# Patient Record
Sex: Male | Born: 1935 | Race: Black or African American | Hispanic: No | Marital: Single | Smoking: Former smoker
Health system: Southern US, Community
[De-identification: ages and names within clinical notes are randomized; demographics above are authoritative.]

## PROBLEM LIST (undated history)

## (undated) DIAGNOSIS — G309 Alzheimer's disease, unspecified: Secondary | ICD-10-CM

## (undated) DIAGNOSIS — N429 Disorder of prostate, unspecified: Secondary | ICD-10-CM

## (undated) DIAGNOSIS — F0280 Dementia in other diseases classified elsewhere without behavioral disturbance: Secondary | ICD-10-CM

## (undated) DIAGNOSIS — I10 Essential (primary) hypertension: Secondary | ICD-10-CM

## (undated) HISTORY — PX: TOTAL HIP ARTHROPLASTY: SHX124

---

## 2000-04-05 ENCOUNTER — Ambulatory Visit (HOSPITAL_COMMUNITY): Admission: RE | Admit: 2000-04-05 | Discharge: 2000-04-05 | Payer: Self-pay | Admitting: Gastroenterology

## 2000-04-05 ENCOUNTER — Encounter (INDEPENDENT_AMBULATORY_CARE_PROVIDER_SITE_OTHER): Payer: Self-pay | Admitting: Specialist

## 2001-05-03 ENCOUNTER — Emergency Department (HOSPITAL_COMMUNITY): Admission: EM | Admit: 2001-05-03 | Discharge: 2001-05-03 | Payer: Self-pay | Admitting: Emergency Medicine

## 2001-05-03 ENCOUNTER — Encounter: Payer: Self-pay | Admitting: Emergency Medicine

## 2004-07-13 ENCOUNTER — Inpatient Hospital Stay (HOSPITAL_COMMUNITY): Admission: RE | Admit: 2004-07-13 | Discharge: 2004-07-19 | Payer: Self-pay | Admitting: Orthopaedic Surgery

## 2006-11-03 ENCOUNTER — Ambulatory Visit: Admission: RE | Admit: 2006-11-03 | Discharge: 2007-02-01 | Payer: Self-pay | Admitting: Radiation Oncology

## 2007-02-02 ENCOUNTER — Encounter: Admission: RE | Admit: 2007-02-02 | Discharge: 2007-02-02 | Payer: Self-pay | Admitting: Urology

## 2007-02-09 ENCOUNTER — Ambulatory Visit (HOSPITAL_BASED_OUTPATIENT_CLINIC_OR_DEPARTMENT_OTHER): Admission: RE | Admit: 2007-02-09 | Discharge: 2007-02-09 | Payer: Self-pay | Admitting: Urology

## 2007-03-02 ENCOUNTER — Ambulatory Visit: Admission: RE | Admit: 2007-03-02 | Discharge: 2007-04-24 | Payer: Self-pay | Admitting: Radiation Oncology

## 2010-04-01 ENCOUNTER — Inpatient Hospital Stay (HOSPITAL_COMMUNITY): Admission: EM | Admit: 2010-04-01 | Discharge: 2009-05-22 | Payer: Self-pay | Admitting: Emergency Medicine

## 2010-07-11 LAB — DIFFERENTIAL
Eosinophils Relative: 0 % (ref 0–5)
Lymphocytes Relative: 6 % — ABNORMAL LOW (ref 12–46)
Lymphs Abs: 0.4 10*3/uL — ABNORMAL LOW (ref 0.7–4.0)
Monocytes Relative: 5 % (ref 3–12)
Neutrophils Relative %: 88 % — ABNORMAL HIGH (ref 43–77)

## 2010-07-11 LAB — CBC
HCT: 37.3 % — ABNORMAL LOW (ref 39.0–52.0)
Hemoglobin: 12.8 g/dL — ABNORMAL LOW (ref 13.0–17.0)
MCHC: 32.8 g/dL (ref 30.0–36.0)
MCV: 93.5 fL (ref 78.0–100.0)
Platelets: 120 10*3/uL — ABNORMAL LOW (ref 150–400)
RBC: 4.05 MIL/uL — ABNORMAL LOW (ref 4.22–5.81)
RDW: 14.5 % (ref 11.5–15.5)
RDW: 14.8 % (ref 11.5–15.5)

## 2010-07-11 LAB — CULTURE, BLOOD (ROUTINE X 2)
Culture: NO GROWTH
Culture: NO GROWTH

## 2010-07-11 LAB — COMPREHENSIVE METABOLIC PANEL
ALT: 49 U/L (ref 0–53)
ALT: 57 U/L — ABNORMAL HIGH (ref 0–53)
AST: 78 U/L — ABNORMAL HIGH (ref 0–37)
Albumin: 2.4 g/dL — ABNORMAL LOW (ref 3.5–5.2)
Alkaline Phosphatase: 78 U/L (ref 39–117)
Alkaline Phosphatase: 82 U/L (ref 39–117)
BUN: 26 mg/dL — ABNORMAL HIGH (ref 6–23)
CO2: 26 mEq/L (ref 19–32)
Calcium: 7.5 mg/dL — ABNORMAL LOW (ref 8.4–10.5)
Glucose, Bld: 95 mg/dL (ref 70–99)
Potassium: 3.9 mEq/L (ref 3.5–5.1)
Sodium: 133 mEq/L — ABNORMAL LOW (ref 135–145)
Sodium: 135 mEq/L (ref 135–145)
Total Bilirubin: 0.7 mg/dL (ref 0.3–1.2)

## 2010-07-11 LAB — URINE MICROSCOPIC-ADD ON

## 2010-07-11 LAB — URINALYSIS, ROUTINE W REFLEX MICROSCOPIC
Glucose, UA: NEGATIVE mg/dL
Glucose, UA: NEGATIVE mg/dL
Ketones, ur: 15 mg/dL — AB
Ketones, ur: 15 mg/dL — AB
Leukocytes, UA: NEGATIVE
Nitrite: NEGATIVE
Protein, ur: >300 mg/dL — AB
Specific Gravity, Urine: 1.023 (ref 1.005–1.030)
pH: 6 (ref 5.0–8.0)

## 2010-07-11 LAB — POCT I-STAT, CHEM 8
BUN: 27 mg/dL — ABNORMAL HIGH (ref 6–23)
Creatinine, Ser: 2.2 mg/dL — ABNORMAL HIGH (ref 0.4–1.5)
Glucose, Bld: 111 mg/dL — ABNORMAL HIGH (ref 70–99)
HCT: 40 % (ref 39.0–52.0)
Potassium: 3.6 mEq/L (ref 3.5–5.1)
Sodium: 131 mEq/L — ABNORMAL LOW (ref 135–145)

## 2010-07-11 LAB — LEGIONELLA ANTIGEN, URINE

## 2010-07-11 LAB — URINE CULTURE: Colony Count: NO GROWTH

## 2010-07-21 ENCOUNTER — Ambulatory Visit
Admission: RE | Admit: 2010-07-21 | Discharge: 2010-07-21 | Disposition: A | Discharge status: Home / Self Care | Payer: Medicare Other | Source: Ambulatory Visit | Attending: Gastroenterology | Admitting: Gastroenterology

## 2010-07-21 ENCOUNTER — Other Ambulatory Visit: Payer: Self-pay | Admitting: Gastroenterology

## 2010-07-21 DIAGNOSIS — Q438 Other specified congenital malformations of intestine: Secondary | ICD-10-CM

## 2010-09-07 NOTE — Op Note (Signed)
NAME:  Manuel [name], Manuel [name]              ACCOUNT NO.:  [id number]   MEDICAL RECORD NO.:  [id number]          PATIENT TYPE:  AMB   LOCATION:  N[facility]C                         FACILITY:  [facility]   PHYSICIAN:  Lindaann Slough, M.D.  DATE OF BIRTH:  04-Jul-1932   DATE OF PROCEDURE:  02/09/2007  DATE OF DISCHARGE:                               OPERATIVE REPORT   PREOPERATIVE DIAGNOSIS:  Adenocarcinoma of prostate.   POSTOPERATIVE DIAGNOSIS:  Adenocarcinoma of prostate.   PROCEDURE DONE:  I-125 seed implantation and cystoscopy.   SURGEON:  Danae Chen, M.D. and Artist Pais. Kathrynn Running, M.D.   ANESTHESIA:  General.   INDICATIONS:  The patient is a 77 year old male who was found by biopsy  to have adenocarcinoma of prostate,  Gleason score 3 + 4.  PSA at  diagnosis was 4.15.  Treatment options were discussed with the patient  and he chose to have radiation.  He was seen by Dr. Kathrynn Running and was  advised to have combination therapy.  He already had IMRT and he is now  scheduled for seeds implantation.   Under general anesthesia the patient was prepped and draped and placed  in the dorsal lithotomy position.  The transducer was inserted in the  rectum after insertion of a Foley catheter and a rectal tube.  Ultrasound planning was done by Dr. Kathrynn Running.  After ultrasound planning,  the Nucletron was used.  A total of 53 seeds were implanted in the  prostate through 20 needles.  The total activity is 17.4370 mCi.  There  appears to be good seeds distribution.  Then the Foley catheter was  removed.  A flexible cystoscope was inserted in the bladder.  The  anterior urethra is normal.  There is moderate prostatic hypertrophy.  The bladder is moderately trabeculated.  There is no stone or tumor or  seed in the bladder.  The ureteral orifices are in normal position and  shape with clear efflux.  The cystoscope was then removed.  A #16 Foley  catheter was then inserted in the bladder.   The patient tolerated the  procedure well and left the OR in satisfactory  condition to postanesthesia care unit.      Lindaann Slough, M.D.  Electronically Signed     MN/MEDQ  D:  02/09/2007  T:  02/10/2007  Job:  [id number]   cc:   Artist Pais Kathrynn Running, M.D.  Fax: [phone]   Merlene Laughter. Renae Gloss, M.D.  Fax: [phone]

## 2010-09-10 NOTE — Op Note (Signed)
NAME:  Manuel [name], Manuel [name]              ACCOUNT NO.:  [id number]   MEDICAL RECORD NO.:  [id number]          PATIENT TYPE:  INP   LOCATION:  2550                         FACILITY:  [facility]   PHYSICIAN:  Lubertha Basque. Dalldorf, M.D.DATE OF BIRTH:  Dec 14, 1932   DATE OF PROCEDURE:  07/13/2004  DATE OF DISCHARGE:                                 OPERATIVE REPORT   REFERRING PHYSICIAN:  Cala Bradford R. Renae Gloss, M.D.   PREOPERATIVE DIAGNOSIS:  Right hip degenerative arthritis.   POSTOPERATIVE DIAGNOSIS:  Right hip degenerative arthritis.   PROCEDURE:  Right total hip replacement.   ANESTHESIA:  Spinal.   ATTENDING SURGEON:  Lubertha Basque. Jerl Santos, M.D.   ASSISTANT:  Lindwood Qua, P.A.   INDICATIONS FOR PROCEDURE:  The patient is a 77 year old man  with long  history of right hip pain and stiffness. By x-ray, he has an end-stage  degenerative change. He has pain which wakes him from sleep and pain which  limits his ability to walk and remain active. He has failed various oral  anti-inflammatories and the use of a cane. He is offered a hip replacement  procedure. Informed operative consent was obtained after discussion of  possible complications of reaction to anesthesia, infection, DVT, PE,  dislocation, and death.   DESCRIPTION OF PROCEDURE:  The patient was taken to the operating suite  where spinal was applied without difficulty. He was positioned in lateral  decubitus position with the right hip up. All bony prominences were  appropriately padded, axillary roll was placed, and hip positioners were  utilized. He was then prepped and draped in normal sterile fashion. After  administration of preoperative IV Kefzol, a posterior approach was taken to  the right hip. All appropriate anti-infective measures were used including  closed hooded exhaust systems for each member the surgical team,  preoperative IV antibiotic, and Betadine impregnated drape. Dissection was  carried through a small amount of  adipose tissue to expose the IT band and  gluteus maximus fascia. These were incised longitudinally. The short  external rotators of the hip were tagged and reflected and a posterior  capsulectomy was performed. The hip was dislocated and a femoral neck cut  made at the appropriate level with an oscillating saw. The acetabulum was  exposed with labral tissues excised. A reaming was taken in a medial  direction to the interior wall of the pelvis and then sequentially up sized  to a 53 followed by placement of a size 54 pinnacle porous coated acetabular  component. Attention was turned toward the femur. This was exposed  appropriately and canal finder was utilized. This was reamed and then  sequentially broached up to a size 5 which seemed to fit well. A trial  reduction was done with several components and seemed best in terms of leg  length and stability with a +5 standard head neck assembly. The trial  components were removed followed by placement of a size 5 standard Summit  porous coated stem by [employer]. This was capped with the 36 +5 cobalt chrome  head assembly. We removed the trial liner from the cuff and placed  a hole  eliminator and a metal on metal liner size 36 x 54. The hip was again  reduced and was stable in extension with external rotation and flexion with  internal rotation. Leg lengths were felt to be about equal. The wound was  thoroughly irrigated followed by reapproximation of the short external  rotators to the greater trochanteric region with nonabsorbable suture. IT  band and gluteus maximus fascia reapproximated with #1 Vicryl interrupted  fashion. Subcutaneous tissues reapproximated with 0 and 2-0 undyed Vicryl  followed by skin closure with staples. Adaptic was placed on the wound  followed by dry gauze and tape. Estimated blood loss and intraoperative  fluids can be obtained from anesthesia records.   DISPOSITION:  The patient was taken to recovery in stable  addition. Plans  were for him to be admitted orthopedic surgery service for appropriate  postop care to include perioperative antibiotics and Coumadin plus Lovenox  for DVT prophylaxis.      PGD/MEDQ  D:  07/13/2004  T:  07/13/2004  Job:  [id number]   cc:   Merlene Laughter. Renae Gloss, M.D.  [address]  [address]  [place]  [place] [address]  Fax: [phone]

## 2010-09-10 NOTE — Discharge Summary (Signed)
NAME:  Manuel [name], Manuel [name] NO.:  [id number]   MEDICAL RECORD NO.:  [id number]          PATIENT TYPE:  INP   LOCATION:  5031                         FACILITY:  [facility]   PHYSICIAN:  Lubertha Basque. Dalldorf, M.D.DATE OF BIRTH:  1933-01-04   DATE OF ADMISSION:  07/13/2004  DATE OF DISCHARGE:  07/19/2004                                 DISCHARGE SUMMARY   ADMITTING DIAGNOSES:  1.  Right hip end-stage degenerative joint disease.  2.  History of hypertension.   DISCHARGE DIAGNOSES:  1.  Right hip end-stage degenerative joint disease.  2.  History of hypertension.  3.  Blood loss anemia.   BRIEF HISTORY:  Manuel [name] is a 77 year old black male patient well known  to our practice.  He has had right hip pain and stiffness for a number of  years.  We have taken x-rays and he has end-stage DJD in his right hip.  Now  he is having trouble sleeping and walking and being comfortable.  He has  failed oral anti-inflammatory medications and is currently using a cane.  X-  rays show end-stage DJD of his hip.  We have discussed with him the  different treatment options of that being a total hip replacement with the  anesthesia risk, infection risk, DVT, PE, dislocation and possible death.   PERTINENT LABORATORY AND X-RAY FINDINGS:  Last pro time was 2.1, hemoglobin  9.9, hematocrit 28.8, WBC 8.6, platelets 121,000.  He was O positive,  antibody screen negative blood type.  Two units of packed RBCs were given as  necessary postoperatively.  Chest x-ray shows no active cardiopulmonary  disease.   POSTOPERATIVELY AND COURSE IN THE HOSPITAL:  He was on an IV of lactated  Ringers, Ancef 1 g q.8h. x3 doses IV, Colace, Trinsicon, Coumadin and  Lovenox protocol for DVT prophylaxis with Percocet and Phenergan.  Also a  PCA morphine pump was used.  He was kept on his home medicines, Benicar 1  p.o. b.i.d.  He was instructed to new knee-high TEDs, incentive spirometry  q.1h., knee immobilizer  right knee at nighttime while in bed.  He had  physical therapy and occupational therapy consults and follow up lab studies  postoperatively as described above.  He could be touchdown weightbearing  postoperatively the first day postop.  His vital signs were stable.  Foley  catheter was in place.  PCA pump was helping his pain control.  He was  afebrile.  Wound was benign.  No sign of irritation or infection, hematocrit  28.  The second day postop, his blood pressure was 140/84, heart rate of  119, temperature was 99, hemoglobin dropped to 7.1.  INR 1.6.  His lungs  were clear.  His abdomen was soft.  Dressing was changed.  Wound was benign.  No sign of irritation of infection.  He had a normal neurovascular status  and two units of packed RBCs were transfused at this time.  The third day  postop, his hemoglobin was repeated and was above 9 post infusion.  Lungs  continued clear.  Abdomen soft throughout his hospital stay and  we had begun  physical therapy.  He was touchdown weightbearing and planned discharge for  Monday due to the fact that he had family members that would be available to  help upon discharge at that time.  The last day of his admission to the  hospital, his blood pressure was 136/79, INR 2.1, heart rate of 106,  temperature was 98.  Lungs were clear to A&P.  Abdomen was soft.  Cardiac:  S1 and S2.  His right hip wound was normal.  No sign of infection or  irritation with good neurovascular status to his toes and he was discharged  home.   CONDITION ON DISCHARGE:  Improved.   DISCHARGE MEDICATIONS:  1.  Pharmacy will dose the Coumadin per DVT prophylaxis protocol.  2.  Percocet one or two every four to six hours as pain for needed.  3.  Benicar one pill b.i.d.  4.  Robaxin 750 one pill every eight hours as needed for spasm.  5.  Ambien 10 mg one at nighttime for sleep p.r.n.   [facility] for physical therapy and pro times to be done.  He is to  be touchdown  weightbearing.  Diet is unrestricted and may do daily dressing  changes.  Any sign of infection, redness, drainage, irritation, increasing  pain, call [phone].  Also, that same number for an appointment in one week.      MC/MEDQ  D:  07/19/2004  T:  07/19/2004  Job:  [id number]

## 2011-02-03 LAB — PROTIME-INR: Prothrombin Time: 13

## 2011-02-03 LAB — COMPREHENSIVE METABOLIC PANEL
ALT: 25
Albumin: 3.6
CO2: 29
Calcium: 9.2
GFR calc non Af Amer: 54 — ABNORMAL LOW
Sodium: 137
Total Protein: 7

## 2011-02-03 LAB — CBC
HCT: 38.8 — ABNORMAL LOW
MCHC: 34
MCV: 89.4
RBC: 4.34

## 2013-09-01 ENCOUNTER — Encounter (HOSPITAL_COMMUNITY): Payer: Self-pay | Admitting: Emergency Medicine

## 2013-09-01 ENCOUNTER — Emergency Department (HOSPITAL_COMMUNITY): Payer: Medicare HMO

## 2013-09-01 ENCOUNTER — Emergency Department (HOSPITAL_COMMUNITY)
Admission: EM | Admit: 2013-09-01 | Discharge: 2013-09-01 | Disposition: A | Discharge status: Home / Self Care | Payer: Medicare HMO | Attending: Emergency Medicine | Admitting: Emergency Medicine

## 2013-09-01 DIAGNOSIS — Z880 Allergy status to penicillin: Secondary | ICD-10-CM | POA: Insufficient documentation

## 2013-09-01 DIAGNOSIS — Z79899 Other long term (current) drug therapy: Secondary | ICD-10-CM | POA: Insufficient documentation

## 2013-09-01 DIAGNOSIS — M545 Low back pain, unspecified: Secondary | ICD-10-CM | POA: Insufficient documentation

## 2013-09-01 DIAGNOSIS — Z87891 Personal history of nicotine dependence: Secondary | ICD-10-CM | POA: Insufficient documentation

## 2013-09-01 DIAGNOSIS — I10 Essential (primary) hypertension: Secondary | ICD-10-CM | POA: Insufficient documentation

## 2013-09-01 DIAGNOSIS — G309 Alzheimer's disease, unspecified: Secondary | ICD-10-CM | POA: Insufficient documentation

## 2013-09-01 DIAGNOSIS — M549 Dorsalgia, unspecified: Secondary | ICD-10-CM

## 2013-09-01 DIAGNOSIS — F0280 Dementia in other diseases classified elsewhere without behavioral disturbance: Secondary | ICD-10-CM | POA: Insufficient documentation

## 2013-09-01 HISTORY — DX: Dementia in other diseases classified elsewhere, unspecified severity, without behavioral disturbance, psychotic disturbance, mood disturbance, and anxiety: F02.80

## 2013-09-01 HISTORY — DX: Essential (primary) hypertension: I10

## 2013-09-01 HISTORY — DX: Alzheimer's disease, unspecified: G30.9

## 2013-09-01 MED ORDER — DIAZEPAM 2 MG PO TABS
2.0000 mg | ORAL_TABLET | Freq: Once | ORAL | Status: AC
  Administered 2013-09-01: 2 mg via ORAL
  Filled 2013-09-01: qty 1

## 2013-09-01 MED ORDER — DIAZEPAM 2 MG PO TABS: 2.0000 mg | ORAL_TABLET | Freq: Four times a day (QID) | ORAL | Status: DC | PRN

## 2013-09-01 NOTE — ED Notes (Addendum)
Pt states back pain starting this morning.  Pt states no trauma.  Pt family states he has been leaning forward more lately.  Walks with walker.  No falls.  No difficulty urinating or pain with urination.  Pain is in lower back and below the belt.

## 2013-09-01 NOTE — Discharge Instructions (Signed)
Back Pain, Adult Low back pain is very common. About 1 in 5 people have back pain.The cause of low back pain is rarely dangerous. The pain often gets better over time.About half of people with a sudden onset of back pain feel better in just 2 weeks. About 8 in 10 people feel better by 6 weeks.  CAUSES Some common causes of back pain include:  Strain of the muscles or ligaments supporting the spine.  Wear and tear (degeneration) of the spinal discs.  Arthritis.  Direct injury to the back. DIAGNOSIS Most of the time, the direct cause of low back pain is not known.However, back pain can be treated effectively even when the exact cause of the pain is unknown.Answering your caregiver's questions about your overall health and symptoms is one of the most accurate ways to make sure the cause of your pain is not dangerous. If your caregiver needs more information, he or she may order lab work or imaging tests (X-rays or MRIs).However, even if imaging tests show changes in your back, this usually does not require surgery. HOME CARE INSTRUCTIONS For many people, back pain returns.Since low back pain is rarely dangerous, it is often a condition that people can learn to manageon their own.   Remain active. It is stressful on the back to sit or stand in one place. Do not sit, drive, or stand in one place for more than 30 minutes at a time. Take short walks on level surfaces as soon as pain allows.Try to increase the length of time you walk each day.  Do not stay in bed.Resting more than 1 or 2 days can delay your recovery.  Do not avoid exercise or work.Your body is made to move.It is not dangerous to be active, even though your back may hurt.Your back will likely heal faster if you return to being active before your pain is gone.  Pay attention to your body when you bend and lift. Many people have less discomfortwhen lifting if they bend their knees, keep the load close to their bodies,and  avoid twisting. Often, the most comfortable positions are those that put less stress on your recovering back.  Find a comfortable position to sleep. Use a firm mattress and lie on your side with your knees slightly bent. If you lie on your back, put a pillow under your knees.  Only take over-the-counter or prescription medicines as directed by your caregiver. Over-the-counter medicines to reduce pain and inflammation are often the most helpful.Your caregiver may prescribe muscle relaxant drugs.These medicines help dull your pain so you can more quickly return to your normal activities and healthy exercise.  Put ice on the injured area.  Put ice in a plastic bag.  Place a towel between your skin and the bag.  Leave the ice on for 15-20 minutes, 03-04 times a day for the first 2 to 3 days. After that, ice and heat may be alternated to reduce pain and spasms.  Ask your caregiver about trying back exercises and gentle massage. This may be of some benefit.  Avoid feeling anxious or stressed.Stress increases muscle tension and can worsen back pain.It is important to recognize when you are anxious or stressed and learn ways to manage it.Exercise is a great option. SEEK MEDICAL CARE IF:  You have pain that is not relieved with rest or medicine.  You have pain that does not improve in 1 week.  You have new symptoms.  You are generally not feeling well. SEEK   IMMEDIATE MEDICAL CARE IF:   You have pain that radiates from your back into your legs.  You develop new bowel or bladder control problems.  You have unusual weakness or numbness in your arms or legs.  You develop nausea or vomiting.  You develop abdominal pain.  You feel faint. Document Released: 04/11/2005 Document Revised: 10/11/2011 Document Reviewed: 08/30/2010 ExitCare Patient Information 2014 [employer].  

## 2013-09-01 NOTE — ED Provider Notes (Signed)
CSN: [id number]     Arrival date & time 09/01/13  1057 History   First MD Initiated Contact with Patient 09/01/13 1145     Chief Complaint  Patient presents with  . Back Pain     (Consider location/radiation/quality/duration/timing/severity/associated sxs/prior Treatment) Patient is a 80 y.o. male presenting with back pain. The history is provided by the patient and a relative.  Back Pain  patient here complaining of lower lumbar back pain that began today when he went to stand up out of bed. He denies any prior history of back surgery but has recently been walking stooped over caused by recent hip surgery. Denies any saddle anesthesias. No radicular components to his back pain. Denies any urinary symptoms. Symptoms characterized as sharp and worse with movement and better with rest. Patient took Tylenol with some relief  Past Medical History  Diagnosis Date  . Alzheimer's dementia   . Hypertension    Past Surgical History  Procedure Laterality Date  . Total hip arthroplasty     History reviewed. No pertinent family history. History  Substance Use Topics  . Smoking status: Former Games developermoker  . Smokeless tobacco: Not on file  . Alcohol Use: No    Review of Systems  Musculoskeletal: Positive for back pain.  All other systems reviewed and are negative.     Allergies  Penicillins  Home Medications   Prior to Admission medications   Medication Sig Start Date End Date Taking? Authorizing Provider  acetaminophen (TYLENOL) 500 MG tablet Take 1,000 mg by mouth every 6 (six) hours as needed for moderate pain.   Yes Historical Provider, MD  losartan-hydrochlorothiazide (HYZAAR) 100-25 MG per tablet Take 1 tablet by mouth daily.   Yes Historical Provider, MD  tamsulosin (FLOMAX) 0.4 MG CAPS capsule Take 0.4 mg by mouth daily after supper.   Yes Historical Provider, MD  VITAMIN D, CHOLECALCIFEROL, PO Take 1 capsule by mouth daily.   Yes Historical Provider, MD  VITAMIN E BLEND PO Take  1 capsule by mouth daily.   Yes Historical Provider, MD   BP 156/86  Pulse 71  Temp(Src) 99.1 F (37.3 C) (Oral)  Resp 20  SpO2 99% Physical Exam  Nursing note and vitals reviewed. Constitutional: He is oriented to person, place, and time. He appears well-developed and well-nourished.  Non-toxic appearance. No distress.  HENT:  Head: Normocephalic and atraumatic.  Eyes: Conjunctivae, EOM and lids are normal. Pupils are equal, round, and reactive to light.  Neck: Normal range of motion. Neck supple. No tracheal deviation present. No mass present.  Cardiovascular: Normal rate, regular rhythm and normal heart sounds.  Exam reveals no gallop.   No murmur heard. Pulmonary/Chest: Effort normal and breath sounds normal. No stridor. No respiratory distress. He has no decreased breath sounds. He has no wheezes. He has no rhonchi. He has no rales.  Abdominal: Soft. Normal appearance and bowel sounds are normal. He exhibits no distension. There is no tenderness. There is no rebound and no CVA tenderness.  Musculoskeletal: Normal range of motion. He exhibits no edema and no tenderness.       Arms: Neurological: He is alert and oriented to person, place, and time. He has normal strength. No cranial nerve deficit or sensory deficit. GCS eye subscore is 4. GCS verbal subscore is 5. GCS motor subscore is 6.  Skin: Skin is warm and dry. No abrasion and no rash noted.  Psychiatric: He has a normal mood and affect. His speech is normal and behavior  is normal.    ED Course  Procedures (including critical care time) Labs Review Labs Reviewed - No data to display  Imaging Review Dg Lumbar Spine Complete  09/01/2013   CLINICAL DATA:  Two week history of low back pain. No recent injuries.  EXAM: LUMBAR SPINE - COMPLETE 4+ VIEW  COMPARISON:  None.  FINDINGS: Five non rib-bearing lumbar vertebrae. Straightening of the usual lumbar lordosis. No fractures. Grade 1 retrolisthesis of L5 relative to S1 measuring  approximately 12 mm, related to degenerative changes in the facet joints at this level. Lesser facet degenerative changes at L4-5. Moderate disc space narrowing and endplate hypertrophic changes at L4-5 and L5-S1. Bridging anterior osteophyte at L1-2. Mild lower thoracic spondylosis. No pars defects. No visualized sacroiliac joints intact.  IMPRESSION: 1. No acute osseous abnormality. 2. Straightening of the usual lumbar lordosis which may reflect positioning and/or spasm. 3. Degenerative grade 1 retrolisthesis of L5 on S1 approximating 12 mm.   Electronically Signed   By: Hulan Saashomas  Lawrence M.D.   On: 09/01/2013 12:57     EKG Interpretation None      MDM   Final diagnoses:  None    Patient given Valium 2 mg by mouth and feels better. No red flags for cauda equina    Toy BakerAnthony T Valera Vallas, MD 09/01/13 1336

## 2015-09-02 DIAGNOSIS — M545 Low back pain: Secondary | ICD-10-CM | POA: Diagnosis not present

## 2015-09-02 DIAGNOSIS — M542 Cervicalgia: Secondary | ICD-10-CM | POA: Diagnosis not present

## 2015-09-02 DIAGNOSIS — N182 Chronic kidney disease, stage 2 (mild): Secondary | ICD-10-CM | POA: Diagnosis not present

## 2015-09-02 DIAGNOSIS — R413 Other amnesia: Secondary | ICD-10-CM | POA: Diagnosis not present

## 2015-09-02 DIAGNOSIS — I129 Hypertensive chronic kidney disease with stage 1 through stage 4 chronic kidney disease, or unspecified chronic kidney disease: Secondary | ICD-10-CM | POA: Diagnosis not present

## 2015-09-02 DIAGNOSIS — R7309 Other abnormal glucose: Secondary | ICD-10-CM | POA: Diagnosis not present

## 2015-09-02 DIAGNOSIS — E559 Vitamin D deficiency, unspecified: Secondary | ICD-10-CM | POA: Diagnosis not present

## 2015-09-02 DIAGNOSIS — Z0000 Encounter for general adult medical examination without abnormal findings: Secondary | ICD-10-CM | POA: Diagnosis not present

## 2016-08-26 DIAGNOSIS — I129 Hypertensive chronic kidney disease with stage 1 through stage 4 chronic kidney disease, or unspecified chronic kidney disease: Secondary | ICD-10-CM | POA: Diagnosis not present

## 2016-08-26 DIAGNOSIS — R7309 Other abnormal glucose: Secondary | ICD-10-CM | POA: Diagnosis not present

## 2016-08-26 DIAGNOSIS — N182 Chronic kidney disease, stage 2 (mild): Secondary | ICD-10-CM | POA: Diagnosis not present

## 2016-08-26 DIAGNOSIS — E782 Mixed hyperlipidemia: Secondary | ICD-10-CM | POA: Diagnosis not present

## 2016-10-04 DIAGNOSIS — Z1389 Encounter for screening for other disorder: Secondary | ICD-10-CM | POA: Diagnosis not present

## 2016-10-04 DIAGNOSIS — N182 Chronic kidney disease, stage 2 (mild): Secondary | ICD-10-CM | POA: Diagnosis not present

## 2016-10-04 DIAGNOSIS — R351 Nocturia: Secondary | ICD-10-CM | POA: Diagnosis not present

## 2016-10-04 DIAGNOSIS — Z0000 Encounter for general adult medical examination without abnormal findings: Secondary | ICD-10-CM | POA: Diagnosis not present

## 2016-10-04 DIAGNOSIS — I129 Hypertensive chronic kidney disease with stage 1 through stage 4 chronic kidney disease, or unspecified chronic kidney disease: Secondary | ICD-10-CM | POA: Diagnosis not present

## 2016-10-04 DIAGNOSIS — R7309 Other abnormal glucose: Secondary | ICD-10-CM | POA: Diagnosis not present

## 2016-10-04 DIAGNOSIS — Z23 Encounter for immunization: Secondary | ICD-10-CM | POA: Diagnosis not present

## 2016-10-04 DIAGNOSIS — E559 Vitamin D deficiency, unspecified: Secondary | ICD-10-CM | POA: Diagnosis not present

## 2016-10-04 DIAGNOSIS — D649 Anemia, unspecified: Secondary | ICD-10-CM | POA: Diagnosis not present

## 2016-10-27 DIAGNOSIS — H18413 Arcus senilis, bilateral: Secondary | ICD-10-CM | POA: Diagnosis not present

## 2016-10-27 DIAGNOSIS — H40023 Open angle with borderline findings, high risk, bilateral: Secondary | ICD-10-CM | POA: Diagnosis not present

## 2016-10-27 DIAGNOSIS — H5201 Hypermetropia, right eye: Secondary | ICD-10-CM | POA: Diagnosis not present

## 2016-10-27 DIAGNOSIS — H25013 Cortical age-related cataract, bilateral: Secondary | ICD-10-CM | POA: Diagnosis not present

## 2016-10-27 DIAGNOSIS — H04123 Dry eye syndrome of bilateral lacrimal glands: Secondary | ICD-10-CM | POA: Diagnosis not present

## 2016-10-27 DIAGNOSIS — H2513 Age-related nuclear cataract, bilateral: Secondary | ICD-10-CM | POA: Diagnosis not present

## 2016-10-27 DIAGNOSIS — H11133 Conjunctival pigmentations, bilateral: Secondary | ICD-10-CM | POA: Diagnosis not present

## 2016-10-27 DIAGNOSIS — H11153 Pinguecula, bilateral: Secondary | ICD-10-CM | POA: Diagnosis not present

## 2016-10-27 DIAGNOSIS — H52221 Regular astigmatism, right eye: Secondary | ICD-10-CM | POA: Diagnosis not present

## 2016-10-27 DIAGNOSIS — H3589 Other specified retinal disorders: Secondary | ICD-10-CM | POA: Diagnosis not present

## 2016-10-27 DIAGNOSIS — H11423 Conjunctival edema, bilateral: Secondary | ICD-10-CM | POA: Diagnosis not present

## 2016-12-19 DIAGNOSIS — M545 Low back pain: Secondary | ICD-10-CM | POA: Diagnosis not present

## 2017-05-01 DIAGNOSIS — E782 Mixed hyperlipidemia: Secondary | ICD-10-CM | POA: Diagnosis not present

## 2017-05-01 DIAGNOSIS — N182 Chronic kidney disease, stage 2 (mild): Secondary | ICD-10-CM | POA: Diagnosis not present

## 2017-05-01 DIAGNOSIS — I129 Hypertensive chronic kidney disease with stage 1 through stage 4 chronic kidney disease, or unspecified chronic kidney disease: Secondary | ICD-10-CM | POA: Diagnosis not present

## 2017-05-01 DIAGNOSIS — R413 Other amnesia: Secondary | ICD-10-CM | POA: Diagnosis not present

## 2017-10-03 DIAGNOSIS — Z8546 Personal history of malignant neoplasm of prostate: Secondary | ICD-10-CM | POA: Diagnosis not present

## 2017-10-03 DIAGNOSIS — N3281 Overactive bladder: Secondary | ICD-10-CM | POA: Diagnosis not present

## 2017-10-23 DIAGNOSIS — Z8546 Personal history of malignant neoplasm of prostate: Secondary | ICD-10-CM | POA: Diagnosis not present

## 2017-11-07 DIAGNOSIS — Z8546 Personal history of malignant neoplasm of prostate: Secondary | ICD-10-CM | POA: Diagnosis not present

## 2017-11-07 DIAGNOSIS — N3281 Overactive bladder: Secondary | ICD-10-CM | POA: Diagnosis not present

## 2017-11-29 DIAGNOSIS — R7309 Other abnormal glucose: Secondary | ICD-10-CM | POA: Diagnosis not present

## 2017-11-29 DIAGNOSIS — I129 Hypertensive chronic kidney disease with stage 1 through stage 4 chronic kidney disease, or unspecified chronic kidney disease: Secondary | ICD-10-CM | POA: Diagnosis not present

## 2017-11-29 DIAGNOSIS — E559 Vitamin D deficiency, unspecified: Secondary | ICD-10-CM | POA: Diagnosis not present

## 2017-11-29 DIAGNOSIS — Z1389 Encounter for screening for other disorder: Secondary | ICD-10-CM | POA: Diagnosis not present

## 2017-11-29 DIAGNOSIS — N3281 Overactive bladder: Secondary | ICD-10-CM | POA: Diagnosis not present

## 2017-11-29 DIAGNOSIS — N182 Chronic kidney disease, stage 2 (mild): Secondary | ICD-10-CM | POA: Diagnosis not present

## 2017-12-06 ENCOUNTER — Other Ambulatory Visit: Payer: Self-pay

## 2017-12-06 NOTE — Patient Outreach (Signed)
[facility]) Care Management  12/06/2017  Runell GessBloise E Fruge 10-14-1932 [id number]   Telephone Screen  Referral Date: 12/06/17 Referral Source: MD office Referral Reason: " please evaluate for PT-are there other services available to him? Insurance: HTA   Outreach attempt # 1 to patient. Both numbers listed currently not in service. No alternate numbers to attempt.       Plan: RN CM will make outreach attempt to patient within 3-4 business days. RN CM will send unsuccessful outreach letter to patient.    Antionette Fairyoshanda Wrangler Penning, RN,BSN,CCM [facility] Care Management Telephonic Care Management Coordinator Direct Phone: [phone] Toll Free: [phone] Fax: [phone]

## 2017-12-07 ENCOUNTER — Other Ambulatory Visit: Payer: Self-pay

## 2017-12-07 NOTE — Patient Outreach (Signed)
[facility]) Care Management  12/07/2017  Manuel GessBloise E [name] 02/08/1933 [id number]   Telephone Screen  Referral Date: 12/06/17 Referral Source: MD office Referral Reason: " please evaluate for PT-are there other services available to him? Insurance: HTA   Outreach attempt #2 to patient. Both numbers listed for patient remain out of service. RN CM made outreach call to referring MD office. Message left for Tawanda in regards to referral and that [facility] eval[facility]tx and requested alternate number for patient as current numbers disconnected.      Plan: RN CM will make outreach attempt to patient within 3-4 business days.  Antionette Fairyoshanda Draeden Kellman, RN,BSN,CCM [facility] Care [facility] Telephonic Care Management Coordinator Direct Phone: [phone] Toll Free: [phone] Fax: [phone]

## 2017-12-07 NOTE — Patient Outreach (Signed)
[facility]) Care Management  12/07/2017  Runell GessBloise E Hannan May 30, 1932 [id number]   Telephone Screen  Referral Date:12/06/17 Referral Source:MD office Referral Reason:" please evaluate for PT-are there other services available to him? Insurance:HTA   Return call from [place] at PCP office. She voices that PCP has also put in a referral for HHPT. MD is currently out of the office until Monday. She states she will call this RN CM back after discussing case with MD to see if referral [facility] needed. She also provided two numbers for patient to be reached at-[phone] or [phone].     Plan: RN CM will wait until call back from MD office to make outreach attempt to patient.   Antionette Fairyoshanda Leaira Fullam, RN,BSN,CCM [facility] Care Management Telephonic Care Management Coordinator Direct Phone: [phone] Toll Free: [phone] Fax: [phone]

## 2017-12-11 ENCOUNTER — Other Ambulatory Visit: Payer: Self-pay

## 2017-12-11 NOTE — Patient Outreach (Signed)
[facility]) Care Management  12/11/2017  Runell GessBloise E Barraco 09/12/1932 [id number]   Care Coordination Call   Incoming call from [facility] PCP office. Per previous call she notified MD that [facility]. [facility]. However, MD still does wish [facility] to [facility] for possible med mgmt. Advised that RN CM would try to reach patient at phone numbers provided by MD office.       Plan: RN CM will make outreach attempt to patient.    Antionette Fairyoshanda Chastidy Ranker, RN,BSN,CCM [facility] Care Management Telephonic Care Management Coordinator Direct Phone: [phone] Toll Free: [phone] Fax: [phone]

## 2017-12-11 NOTE — Patient Outreach (Signed)
[facility]N) Care Management  12/11/2017  Manuel [name] [birth year] [id number]   Telephone Screen  Referral Date:12/06/17 Referral Source:MD office Referral Reason:" please evaluate for PT-are there other services available to him? Insurance:HTA   Outreach attempt to patient at new numbers provided by MD office.  [phone]- no answer and voicemail for another name other than patient and no message left. RN CM made outreach attempt to patient at [phone]. A male answered the phone. She identified herself as patient's sister([name]). She voices that patient is not at home. She states that she handles his affairs. No DPR form on file for patient so unable to complete call with sister. She did freely report that patient goes to senior program at [name] during the weeks from 8:30am- 12:30pm and the best time to call patient is later in the afternoon. Advised that RN CM would try to reach patient at a later time.      Plan: RN CM will make outreach attempt to patient within 3-4 business days.   Antionette Fairyoshanda Dulce Martian, RN,BSN,CCM [facility] Care Management Telephonic Care Management Coordinator Direct Phone: [phone] Toll Free: [phone] Fax: [phone]

## 2017-12-11 NOTE — Patient Outreach (Addendum)
[facility]N) Care Management  12/11/2017  Manuel [name] 03-05-1933 [id number]   Telephone Screen  Referral Date:12/06/17 Referral Source:MD office Referral Reason:" please evaluate for PT-are there other services available to him? Insurance:HTA   Outgoing call to patient. Spoke with patient at [phone]. (He does not have a phone of his own and just uses his sister's phone.) Patient having difficulty understanding and answering RN CM questions. Verbal consent given to complete screening all with his sister/caregiver.   Social: patient lives with his sister. He is fairly independent with ADLs but requires assistance with IADLs. His family drives him to medical appts. Caregiver voices that [facility] [employer] [name] M-F from 8:30am -12:30pm. He takes SCAT to center. She denies patient having any falls. He uses a cane.    Conditions: Per chart review, patient has PMH of Alzheimer's disease, HTN, total hip arthroplasty, prostate CA(in remission) and HTN. Sister states that her daughter comes over and checks patient's BP. She denies any issues or concerns that need nursing assistance for mgmt of chronic conditions.    Medications: Caregiver voices that patient takes about eight meds. She or her daughter fills med planner weekly for patent. However, she is concerned about how accurately patient is taking meds. She reports that she leaves med planner on night stand for patient to take meds as he like to take meds in the evening. Caregiver voices that there has been several incidents were she has found meds that patient stated he has taken in his pockets, in the laundry and lying around other places. She is concerned if patient really taking meds as he is supposed to.  Appointments: Patient sees PCP regularly.    Advance Directives: Caregiver voices that she knows she needs to do so given patient's mental status while he is still capable and  competent. She is agreeable to RN CM mailing out info for them to review and complete. She voices that she also will work on [employer] financial POA as she handles patient's finances as well.    Plan: RN CM [facility] for possible med mgmt assistance. RN CM will send advance directive packet via mail.  Antionette Fairyoshanda Damyra Luscher, RN,BSN,CCM [facility] Care Management Telephonic Care Management Coordinator Direct Phone: [phone] Toll Free: [phone] Fax: [phone]

## 2017-12-12 ENCOUNTER — Other Ambulatory Visit: Payer: Self-pay | Admitting: Pharmacist

## 2017-12-12 NOTE — Patient Outreach (Signed)
[facility](THN) Care Management  12/12/2017  Manuel [name] 23-Aug-1932 [id number]  84 year old [facility] by PCP Dorothyann Pengobyn Sanders.  [facility] for medication management/medication adherence concerns.  PMHx includes, but not limited to, hypertension, hyperlipidemia, hx prostate cancer, CKD, insomnia.   Per RN note on 12/11/2017, patient is available at sister's number at [phone] [facility](Manuel [name]). Left HIPAA compliant voicemail for patient to return my call.   Plan:  - Will f/u in 3-5 business days if I have not heard back from patient.   Catie Feliz Beamravis, PharmD PGY2 Ambulatory Care Pharmacy Resident Phone: [phone]

## 2017-12-15 ENCOUNTER — Other Ambulatory Visit: Payer: Self-pay | Admitting: Pharmacist

## 2017-12-15 ENCOUNTER — Ambulatory Visit: Payer: Self-pay | Admitting: Pharmacist

## 2017-12-15 NOTE — Patient Outreach (Signed)
[facility](THN) Care Management  12/15/2017  Manuel GessBloise E Gloss 1932-08-23 Manuel [name]   84 year old [facility] by [facility]CP Dorothyann Pengobyn Sanders.  [facility] requested for medication management/medication adherence concerns.  PMHx includes, but not limited to, hypertension, hyperlipidemia, hx prostate cancer, CKD, insomnia.   Contacted caregiver sister Noel ChristmasMabel [phone]), left HIPAA compliant voicemail for her to return my call. Sent unsuccessful outreach letter.   Plan - Will f/u in 3-5 business days if I have not heard back  Catie Feliz Beamravis, PharmD PGY2 Ambulatory Care Pharmacy Resident Phone: [phone]

## 2017-12-20 ENCOUNTER — Other Ambulatory Visit: Payer: Self-pay | Admitting: Pharmacist

## 2017-12-20 NOTE — Patient Outreach (Signed)
[facility]N) Care Management  12/20/2017  Manuel [name] 05-09-1932 [id number]   Call attempt #3 to caregiver [facility][name]) was successful, but she was busy and asked that I call her back later in the afternoon. She did not answer my return call. Left HIPAA compliant voicemail for her to return my call.   Plan - Will f/u in 3-5 business days if I have not heard back    Catie Feliz Beamravis, PharmD PGY2 Ambulatory Care Pharmacy Resident Phone: [phone]

## 2017-12-29 ENCOUNTER — Other Ambulatory Visit: Payer: Self-pay | Admitting: Pharmacist

## 2017-12-29 NOTE — Patient Outreach (Signed)
[facility]) Care Management  12/29/2017  Manuel [name] [birth year] [id number]   Have not heard back from patient or patient's caregiver. Per workflow policy, will close patient case d/t failure to engage and maintain contact.   Plan - Close case.  - Case closure letter to referring MD office (Dr. Dorothyann Peng)  Catie Feliz Beam, PharmD PGY2 Ambulatory Care Pharmacy Resident Phone: [phone]

## 2018-03-16 ENCOUNTER — Other Ambulatory Visit: Payer: Self-pay | Admitting: Nurse Practitioner

## 2018-04-05 ENCOUNTER — Other Ambulatory Visit: Payer: Self-pay

## 2018-04-05 MED ORDER — LOSARTAN POTASSIUM-HCTZ 100-25 MG PO TABS: 1.0000 | ORAL_TABLET | Freq: Every day | ORAL | 1 refills | Status: DC

## 2018-04-11 ENCOUNTER — Other Ambulatory Visit: Payer: Self-pay

## 2018-04-11 MED ORDER — LOSARTAN POTASSIUM 100 MG PO TABS: 100.0000 mg | ORAL_TABLET | Freq: Every day | ORAL | 1 refills | Status: DC

## 2018-04-11 MED ORDER — HYDROCHLOROTHIAZIDE 25 MG PO TABS: 25.0000 mg | ORAL_TABLET | Freq: Every day | ORAL | 0 refills | Status: DC

## 2018-04-12 ENCOUNTER — Telehealth: Payer: Self-pay

## 2018-04-12 NOTE — Telephone Encounter (Signed)
Returned the pt's niece call Vivien Rossettioni Pickard and reminded her that her uncles blood pressure medication was broken in to two prescriptions because the pt's pharmacy was out of the combination and said it is on back order.

## 2018-06-06 ENCOUNTER — Ambulatory Visit (INDEPENDENT_AMBULATORY_CARE_PROVIDER_SITE_OTHER): Payer: PPO

## 2018-06-06 ENCOUNTER — Ambulatory Visit (INDEPENDENT_AMBULATORY_CARE_PROVIDER_SITE_OTHER): Payer: PPO | Admitting: Internal Medicine

## 2018-06-06 ENCOUNTER — Encounter: Payer: Self-pay | Admitting: Internal Medicine

## 2018-06-06 ENCOUNTER — Other Ambulatory Visit: Payer: Self-pay

## 2018-06-06 VITALS — BP 130/78 | HR 85 | Temp 98.1°F | Ht 60.5 in | Wt 183.2 lb

## 2018-06-06 DIAGNOSIS — R413 Other amnesia: Secondary | ICD-10-CM | POA: Diagnosis not present

## 2018-06-06 DIAGNOSIS — Z8546 Personal history of malignant neoplasm of prostate: Secondary | ICD-10-CM

## 2018-06-06 DIAGNOSIS — I10 Essential (primary) hypertension: Secondary | ICD-10-CM | POA: Diagnosis not present

## 2018-06-06 DIAGNOSIS — Z6835 Body mass index (BMI) 35.0-35.9, adult: Secondary | ICD-10-CM

## 2018-06-06 DIAGNOSIS — R7309 Other abnormal glucose: Secondary | ICD-10-CM | POA: Diagnosis not present

## 2018-06-06 DIAGNOSIS — G4700 Insomnia, unspecified: Secondary | ICD-10-CM | POA: Diagnosis not present

## 2018-06-06 DIAGNOSIS — E66812 Obesity, class 2: Secondary | ICD-10-CM

## 2018-06-06 DIAGNOSIS — E7800 Pure hypercholesterolemia, unspecified: Secondary | ICD-10-CM | POA: Diagnosis not present

## 2018-06-06 NOTE — Patient Instructions (Signed)

## 2018-06-06 NOTE — Chronic Care Management (AMB) (Signed)
  Care Management Note   Manuel [name] is a 85 y.o. year old male who sees [name], MD for primary care. Dr. Baird Cancer asked the CCM team to consult the patient for assistance with chronic disease management related to Essential Hypertension, and Memory loss. Referral was placed.   Review of patient status, including review of consultants reports, relevant laboratory and other test results, and collaboration with appropriate care team members and the patient's provider was performed as part of comprehensive patient evaluation and provision of chronic care management services.   In office personal introduction to patient and caregiver Manuel [name] (niece) today to introduce CCM services.   [name] was given information about Chronic Care Management services today including:  1. CCM service includes personalized support from designated clinical staff supervised by his physician, including individualized plan of care and coordination with other care providers 2. 24/7 contact phone numbers for assistance for urgent and routine care needs. 3. Service will only be billed when office clinical staff spend 20 minutes or more in a month to coordinate care. 4. Only one practitioner may furnish and bill the service in a calendar month. 5. The patient may stop CCM services at any time (effective at the end of the month) by phone call to the office staff. 6. The patient will be responsible for cost sharing (co-pay) of up to 20% of the service fee (after annual deductible is met).  Patient agreed to services and verbal consent obtained.    Follow Up Plan: Patients niece Manuel [name] will call the CCM team to schedule a Face to Face visit.   Barb Merino, RN,CCM Care Management Coordinator [place] Management/[facility]  Direct Phone: [phone]

## 2018-06-07 LAB — TSH: TSH: 1.97 u[IU]/mL (ref 0.450–4.500)

## 2018-06-07 LAB — CMP14+EGFR
ALT: 7 IU/L (ref 0–44)
AST: 15 IU/L (ref 0–40)
Albumin/Globulin Ratio: 1.2 (ref 1.2–2.2)
Albumin: 3.9 g/dL (ref 3.6–4.6)
Alkaline Phosphatase: 64 IU/L (ref 39–117)
BUN / CREAT RATIO: 11 (ref 10–24)
BUN: 13 mg/dL (ref 8–27)
Bilirubin Total: 0.2 mg/dL (ref 0.0–1.2)
CO2: 25 mmol/L (ref 20–29)
CREATININE: 1.22 mg/dL (ref 0.76–1.27)
Calcium: 9.1 mg/dL (ref 8.6–10.2)
Chloride: 95 mmol/L — ABNORMAL LOW (ref 96–106)
GFR calc Af Amer: 63 mL/min/{1.73_m2} (ref >59)
GFR, EST NON AFRICAN AMERICAN: 54 mL/min/{1.73_m2} — AB (ref >59)
GLOBULIN, TOTAL: 3.2 g/dL (ref 1.5–4.5)
Glucose: 82 mg/dL (ref 65–99)
Potassium: 3.9 mmol/L (ref 3.5–5.2)
Sodium: 132 mmol/L — ABNORMAL LOW (ref 134–144)
Total Protein: 7.1 g/dL (ref 6.0–8.5)

## 2018-06-07 LAB — RPR: RPR Ser Ql: NONREACTIVE

## 2018-06-07 LAB — VITAMIN B12: Vitamin B-12: 363 pg/mL (ref 232–1245)

## 2018-06-07 LAB — LIPID PANEL
Chol/HDL Ratio: 3.3 ratio (ref 0.0–5.0)
Cholesterol, Total: 150 mg/dL (ref 100–199)
HDL: 46 mg/dL (ref >39)
LDL Calculated: 80 mg/dL (ref 0–99)
Triglycerides: 119 mg/dL (ref 0–149)
VLDL Cholesterol Cal: 24 mg/dL (ref 5–40)

## 2018-06-07 LAB — HEMOGLOBIN A1C
Est. average glucose Bld gHb Est-mCnc: 100 mg/dL
Hgb A1c MFr Bld: 5.1 % (ref 4.8–5.6)

## 2018-06-14 ENCOUNTER — Ambulatory Visit: Payer: Self-pay

## 2018-06-14 ENCOUNTER — Telehealth: Payer: Self-pay

## 2018-06-14 DIAGNOSIS — R413 Other amnesia: Secondary | ICD-10-CM

## 2018-06-14 DIAGNOSIS — I10 Essential (primary) hypertension: Secondary | ICD-10-CM

## 2018-06-14 NOTE — Chronic Care Management (AMB) (Signed)
  Chronic Care Management   Follow Up Note   06/14/2018 Name: Manuel [name] MRN: [id number] DOB: [birth year]  Referred by: Dorothyann Peng, MD Reason for referral : Chronic Care Management (TELEPHONE OUTREACH CCM FOLLOW-UP CALL)  Chronic Care Management   Outreach Note  06/14/2018 Name: Manuel [name] MRN: [id number] DOB: 1933/02/04  Referred by: Dorothyann Peng, MD Reason for referral : Chronic Care Management (TELEPHONE OUTREACH CCM FOLLOW-UP CALL)  An unsuccessful telephone outreach to [name] was attempted today. [name] was referred to the case management team by Dr. Allyne Gee for assistance with Essential Hypertension, and Memory loss.    Follow Up Plan: The CM team will reach out to the patient/caregiver again over the next 7 days in attempts to establish the initial CCM face to face visit.   Delsa Sale, RN,CCM Care Management Coordinator [facility] Care Management/[facility]  Direct Phone: [phone]

## 2018-06-17 NOTE — Progress Notes (Signed)
Subjective:     Patient ID: Manuel [name] , male    DOB: 1932-05-10 , 85 y.o.   MRN: [id number]   Chief Complaint  Patient presents with  . Hypertension    HPI  He is here today for bp f/u. He is accompanied by his niece. He lives in home with his sister and niece.   Hypertension  This is a chronic problem. The current episode started more than 1 year ago. The problem has been gradually improving since onset. The problem is controlled. Pertinent negatives include no blurred vision, chest pain, palpitations or shortness of breath. Risk factors for coronary artery disease include male gender and sedentary lifestyle.     Past Medical History:  Diagnosis Date  . Alzheimer's dementia ([place])   . Hypertension      Family History  Problem Relation Age of Onset  . Early death Mother   . Prostate cancer Father      Current Outpatient Medications:  .  acetaminophen (TYLENOL) 500 MG tablet, Take 1,000 mg by mouth every 6 (six) hours as needed for moderate pain., Disp: , Rfl:  .  amLODipine (NORVASC) 5 MG tablet, , Disp: , Rfl:  .  diazepam (VALIUM) 2 MG tablet, Take 1 tablet (2 mg total) by mouth every 6 (six) hours as needed for muscle spasms., Disp: 14 tablet, Rfl: 0 .  losartan-hydrochlorothiazide (HYZAAR) 100-25 MG tablet, Take 1 tablet by mouth daily., Disp: 90 tablet, Rfl: 1 .  tamsulosin (FLOMAX) 0.4 MG CAPS capsule, Take 0.4 mg by mouth daily after supper., Disp: , Rfl:  .  VITAMIN D, CHOLECALCIFEROL, PO, Take 1 capsule by mouth daily., Disp: , Rfl:  .  VITAMIN E BLEND PO, Take 1 capsule by mouth daily., Disp: , Rfl:    Allergies  Allergen Reactions  . Penicillins Swelling     Review of Systems  Constitutional: Negative.   Eyes: Negative for blurred vision.  Respiratory: Negative.  Negative for shortness of breath.   Cardiovascular: Negative.  Negative for chest pain and palpitations.  Gastrointestinal: Negative.   Neurological: Negative.        His niece states  that she thinks the patient is having some memory issues. She states he often repeats things.   Psychiatric/Behavioral: Positive for sleep disturbance. Negative for decreased concentration ().     Today's Vitals   06/06/18 1038  BP: 130/78  Pulse: 85  Temp: 98.1 F (36.7 C)  TempSrc: Oral  Weight: 183 lb 3.2 oz (83.1 kg)  Height: 5' 0.5" (1.537 m)   Body mass index is 35.19 kg/m.   Objective:  Physical Exam Vitals signs and nursing note reviewed.  Constitutional:      Appearance: Normal appearance. He is obese.  HENT:     Head: Normocephalic and atraumatic.  Cardiovascular:     Rate and Rhythm: Normal rate and regular rhythm.     Heart sounds: Normal heart sounds.  Pulmonary:     Effort: Pulmonary effort is normal.     Breath sounds: Normal breath sounds.  Skin:    General: Skin is warm.  Neurological:     Mental Status: He is alert.  Psychiatric:        Mood and Affect: Mood normal.         Assessment And Plan:     1. Essential hypertension, benign  Controlled. He will continue with current meds. He is encouraged to avoid adding salt to her foods.   - CMP14+EGFR - Lipid  panel  2. Insomnia, unspecified type  Importance of good bedtime hygiene was discussed with the patient. His niece will get him some melatonin to try nightly prn. She will let me know how this works for him.   3. Other abnormal glucose  HIS A1C HAS BEEN ELEVATED IN THE PAST. I WILL CHECK AN A1C, BMET TODAY. HE WAS ENCOURAGED TO AVOID SUGARY BEVERAGES AND PROCESSED FOODS INCLUDNG BREADS, RICE AND PASTA.  - Hemoglobin A1c  4. Pure hypercholesterolemia  He is encouraged to avoid fried foods when possible and to incorporate more activity into his daily routine. He is reminded that he can perform chair exercises while watching TV.   5. Memory loss  I will refer him to CCM. He and his niece are in agreement with referral.   - Referral to Chronic Care Management Services - RPR - TSH -  Vitamin B12  6. History of prostate cancer  As per Urology.   7. Class 2 severe obesity due to excess calories with serious comorbidity and body mass index (BMI) of 35.0 to 35.9 in adult [facility])  He is encouraged to initially strive for BMI less than 30 to decrease cardiac risk. He is advised to exercise no less than 150 minutes per week. He is reminded that chair exercises and walking in place counts towards his exercise goal.    [name], MD

## 2018-06-20 ENCOUNTER — Ambulatory Visit: Payer: Self-pay

## 2018-06-20 DIAGNOSIS — R413 Other amnesia: Secondary | ICD-10-CM

## 2018-06-20 DIAGNOSIS — I10 Essential (primary) hypertension: Secondary | ICD-10-CM

## 2018-06-20 NOTE — Chronic Care Management (AMB) (Signed)
  Chronic Care Management   Note  06/20/2018 Name: Manuel [name] MRN: [id number] DOB: 1933/02/19    Chronic Care Management   Outreach Note  06/20/2018 Name: Manuel [name] MRN: [id number] DOB: 12-Apr-1933  Referred by: Dorothyann Peng, MD Reason for referral : Chronic Care Management (TELEPHONE ATTEMPT #2 CCM FOLLOW-UP (to schedule initial FACE TO FACE))   Second unsuccessful telephone outreach to [name] and his niece [name] was attempted today. Mr. Stork was referred to the case management team by Dr. Dorothyann Peng for assistance with Hypertension and memory loss   Follow up plan: The CM team will reach out to the patient again over the next 7-10 days days.   Delsa Sale, RN,CCM Care Management Coordinator [facility] Care Management/[facility]  Direct Phone: [phone]

## 2018-06-24 ENCOUNTER — Observation Stay (HOSPITAL_COMMUNITY)
Admission: EM | Admit: 2018-06-24 | Discharge: 2018-06-25 | Disposition: A | Discharge status: Home / Self Care | Payer: PPO | Attending: Internal Medicine | Admitting: Internal Medicine

## 2018-06-24 ENCOUNTER — Emergency Department (HOSPITAL_COMMUNITY): Payer: PPO

## 2018-06-24 ENCOUNTER — Other Ambulatory Visit: Payer: Self-pay

## 2018-06-24 ENCOUNTER — Encounter (HOSPITAL_COMMUNITY): Payer: Self-pay

## 2018-06-24 DIAGNOSIS — J019 Acute sinusitis, unspecified: Secondary | ICD-10-CM | POA: Insufficient documentation

## 2018-06-24 DIAGNOSIS — F0280 Dementia in other diseases classified elsewhere without behavioral disturbance: Secondary | ICD-10-CM | POA: Insufficient documentation

## 2018-06-24 DIAGNOSIS — Z87891 Personal history of nicotine dependence: Secondary | ICD-10-CM | POA: Insufficient documentation

## 2018-06-24 DIAGNOSIS — M542 Cervicalgia: Secondary | ICD-10-CM | POA: Diagnosis not present

## 2018-06-24 DIAGNOSIS — R509 Fever, unspecified: Secondary | ICD-10-CM | POA: Diagnosis not present

## 2018-06-24 DIAGNOSIS — N400 Enlarged prostate without lower urinary tract symptoms: Secondary | ICD-10-CM | POA: Diagnosis not present

## 2018-06-24 DIAGNOSIS — Z7982 Long term (current) use of aspirin: Secondary | ICD-10-CM | POA: Insufficient documentation

## 2018-06-24 DIAGNOSIS — M6281 Muscle weakness (generalized): Secondary | ICD-10-CM | POA: Insufficient documentation

## 2018-06-24 DIAGNOSIS — Z8042 Family history of malignant neoplasm of prostate: Secondary | ICD-10-CM | POA: Insufficient documentation

## 2018-06-24 DIAGNOSIS — M47812 Spondylosis without myelopathy or radiculopathy, cervical region: Secondary | ICD-10-CM | POA: Diagnosis not present

## 2018-06-24 DIAGNOSIS — R2681 Unsteadiness on feet: Secondary | ICD-10-CM | POA: Diagnosis not present

## 2018-06-24 DIAGNOSIS — Z96649 Presence of unspecified artificial hip joint: Secondary | ICD-10-CM | POA: Insufficient documentation

## 2018-06-24 DIAGNOSIS — G309 Alzheimer's disease, unspecified: Secondary | ICD-10-CM | POA: Diagnosis not present

## 2018-06-24 DIAGNOSIS — R05 Cough: Secondary | ICD-10-CM | POA: Diagnosis not present

## 2018-06-24 DIAGNOSIS — Z880 Allergy status to penicillin: Secondary | ICD-10-CM | POA: Diagnosis not present

## 2018-06-24 DIAGNOSIS — Z79899 Other long term (current) drug therapy: Secondary | ICD-10-CM | POA: Diagnosis not present

## 2018-06-24 DIAGNOSIS — I10 Essential (primary) hypertension: Secondary | ICD-10-CM | POA: Diagnosis not present

## 2018-06-24 HISTORY — DX: Disorder of prostate, unspecified: N42.9

## 2018-06-24 LAB — CSF CELL COUNT WITH DIFFERENTIAL
RBC Count, CSF: 140 /mm3 — ABNORMAL HIGH
RBC Count, CSF: 8 /mm3 — ABNORMAL HIGH
Tube #: 1
Tube #: 4
WBC, CSF: 0 /mm3 (ref 0–5)
WBC, CSF: 1 /mm3 (ref 0–5)

## 2018-06-24 LAB — URINALYSIS, ROUTINE W REFLEX MICROSCOPIC
Bilirubin Urine: NEGATIVE
Glucose, UA: NEGATIVE mg/dL
Hgb urine dipstick: NEGATIVE
Ketones, ur: NEGATIVE mg/dL
Leukocytes,Ua: NEGATIVE
Nitrite: NEGATIVE
Protein, ur: 30 mg/dL — AB
Specific Gravity, Urine: 1.024 (ref 1.005–1.030)
pH: 6 (ref 5.0–8.0)

## 2018-06-24 LAB — POCT I-STAT EG7
Acid-Base Excess: 1 mmol/L (ref 0.0–2.0)
Bicarbonate: 25.8 mmol/L (ref 20.0–28.0)
Calcium, Ion: 1.18 mmol/L (ref 1.15–1.40)
HCT: 34 % — ABNORMAL LOW (ref 39.0–52.0)
Hemoglobin: 11.6 g/dL — ABNORMAL LOW (ref 13.0–17.0)
O2 Saturation: 47 %
PCO2 VEN: 40.9 mmHg — AB (ref 44.0–60.0)
PO2 VEN: 26 mmHg — AB (ref 32.0–45.0)
Potassium: 3.2 mmol/L — ABNORMAL LOW (ref 3.5–5.1)
Sodium: 136 mmol/L (ref 135–145)
TCO2: 27 mmol/L (ref 22–32)
pH, Ven: 7.408 (ref 7.250–7.430)

## 2018-06-24 LAB — COMPREHENSIVE METABOLIC PANEL WITH GFR
ALT: 13 U/L (ref 0–44)
AST: 16 U/L (ref 15–41)
Albumin: 3.5 g/dL (ref 3.5–5.0)
Alkaline Phosphatase: 53 U/L (ref 38–126)
Anion gap: 9 (ref 5–15)
BUN: 20 mg/dL (ref 8–23)
CO2: 25 mmol/L (ref 22–32)
Calcium: 9 mg/dL (ref 8.9–10.3)
Chloride: 101 mmol/L (ref 98–111)
Creatinine, Ser: 1.18 mg/dL (ref 0.61–1.24)
GFR calc Af Amer: >60 mL/min
GFR calc non Af Amer: 57 mL/min — ABNORMAL LOW
Glucose, Bld: 110 mg/dL — ABNORMAL HIGH (ref 70–99)
Potassium: 3.1 mmol/L — ABNORMAL LOW (ref 3.5–5.1)
Sodium: 135 mmol/L (ref 135–145)
Total Bilirubin: 0.6 mg/dL (ref 0.3–1.2)
Total Protein: 7.1 g/dL (ref 6.5–8.1)

## 2018-06-24 LAB — CBC WITH DIFFERENTIAL/PLATELET
Abs Immature Granulocytes: 0.03 K/uL (ref 0.00–0.07)
Basophils Absolute: 0 K/uL (ref 0.0–0.1)
Basophils Relative: 0 %
Eosinophils Absolute: 0 K/uL (ref 0.0–0.5)
Eosinophils Relative: 0 %
HCT: 33.3 % — ABNORMAL LOW (ref 39.0–52.0)
Hemoglobin: 10.4 g/dL — ABNORMAL LOW (ref 13.0–17.0)
Immature Granulocytes: 1 %
Lymphocytes Relative: 22 %
Lymphs Abs: 1.3 K/uL (ref 0.7–4.0)
MCH: 29.5 pg (ref 26.0–34.0)
MCHC: 31.2 g/dL (ref 30.0–36.0)
MCV: 94.3 fL (ref 80.0–100.0)
Monocytes Absolute: 0.5 K/uL (ref 0.1–1.0)
Monocytes Relative: 9 %
Neutro Abs: 4.2 K/uL (ref 1.7–7.7)
Neutrophils Relative %: 68 %
Platelets: 213 K/uL (ref 150–400)
RBC: 3.53 MIL/uL — ABNORMAL LOW (ref 4.22–5.81)
RDW: 13.8 % (ref 11.5–15.5)
WBC: 6.1 K/uL (ref 4.0–10.5)
nRBC: 0 % (ref 0.0–0.2)

## 2018-06-24 LAB — PROTIME-INR
INR: 1 (ref 0.8–1.2)
Prothrombin Time: 13.1 seconds (ref 11.4–15.2)

## 2018-06-24 LAB — PROTEIN, CSF: Total  Protein, CSF: 63 mg/dL — ABNORMAL HIGH (ref 15–45)

## 2018-06-24 LAB — LACTIC ACID, PLASMA
Lactic Acid, Venous: 1.8 mmol/L (ref 0.5–1.9)
Lactic Acid, Venous: 0.9 mmol/L (ref 0.5–1.9)

## 2018-06-24 LAB — INFLUENZA PANEL BY PCR (TYPE A & B)
INFLBPCR: NEGATIVE
Influenza A By PCR: NEGATIVE

## 2018-06-24 LAB — GRAM STAIN: Gram Stain: NONE SEEN

## 2018-06-24 LAB — CRYPTOCOCCAL ANTIGEN, CSF: Crypto Ag: NEGATIVE

## 2018-06-24 LAB — GLUCOSE, CSF: Glucose, CSF: 53 mg/dL (ref 40–70)

## 2018-06-24 LAB — I-STAT CREATININE, ED: CREATININE: 1.2 mg/dL (ref 0.61–1.24)

## 2018-06-24 MED ORDER — DEXAMETHASONE SODIUM PHOSPHATE 10 MG/ML IJ SOLN
10.0000 mg | Freq: Once | INTRAMUSCULAR | Status: AC
  Administered 2018-06-24: 10 mg via INTRAVENOUS
  Filled 2018-06-24: qty 1

## 2018-06-24 MED ORDER — ENOXAPARIN SODIUM 40 MG/0.4ML ~~LOC~~ SOLN
40.0000 mg | SUBCUTANEOUS | Status: DC
  Administered 2018-06-25: 40 mg via SUBCUTANEOUS
  Filled 2018-06-24: qty 0.4

## 2018-06-24 MED ORDER — LIDOCAINE HCL 1 % IJ SOLN
INTRAMUSCULAR | Status: AC
  Administered 2018-06-24: 20 mL
  Filled 2018-06-24: qty 20

## 2018-06-24 MED ORDER — ACETAMINOPHEN 325 MG PO TABS
650.0000 mg | ORAL_TABLET | Freq: Four times a day (QID) | ORAL | Status: DC | PRN
  Administered 2018-06-24: 650 mg via ORAL
  Filled 2018-06-24: qty 2

## 2018-06-24 MED ORDER — SALINE SPRAY 0.65 % NA SOLN: 1.0000 | NASAL | Status: DC | PRN

## 2018-06-24 MED ORDER — IOPAMIDOL (ISOVUE-300) INJECTION 61%
INTRAVENOUS | Status: AC
  Filled 2018-06-24: qty 100

## 2018-06-24 MED ORDER — LIDOCAINE HCL 1 % IJ SOLN
10.0000 mL | Freq: Once | INTRAMUSCULAR | Status: DC
  Filled 2018-06-24: qty 20

## 2018-06-24 MED ORDER — SODIUM CHLORIDE (PF) 0.9 % IJ SOLN
INTRAMUSCULAR | Status: AC
  Filled 2018-06-24: qty 50

## 2018-06-24 MED ORDER — GADOBUTROL 1 MMOL/ML IV SOLN
10.0000 mL | Freq: Once | INTRAVENOUS | Status: AC | PRN
  Administered 2018-06-24: 10 mL via INTRAVENOUS

## 2018-06-24 MED ORDER — SODIUM CHLORIDE 0.9 % IV SOLN
2.0000 g | Freq: Once | INTRAVENOUS | Status: AC
  Administered 2018-06-24: 2 g via INTRAVENOUS
  Filled 2018-06-24: qty 20

## 2018-06-24 MED ORDER — ONDANSETRON HCL 4 MG PO TABS: 4.0000 mg | ORAL_TABLET | Freq: Four times a day (QID) | ORAL | Status: DC | PRN

## 2018-06-24 MED ORDER — FLUTICASONE PROPIONATE 50 MCG/ACT NA SUSP
2.0000 | Freq: Every day | NASAL | Status: DC
  Administered 2018-06-25: 2 via NASAL
  Filled 2018-06-24: qty 16

## 2018-06-24 MED ORDER — CYCLOBENZAPRINE HCL 5 MG PO TABS
5.0000 mg | ORAL_TABLET | Freq: Three times a day (TID) | ORAL | Status: DC
  Administered 2018-06-24 – 2018-06-25 (×2): 5 mg via ORAL
  Filled 2018-06-24 (×2): qty 1

## 2018-06-24 MED ORDER — ACETAMINOPHEN 650 MG RE SUPP: 650.0000 mg | Freq: Four times a day (QID) | RECTAL | Status: DC | PRN

## 2018-06-24 MED ORDER — TRAMADOL HCL 50 MG PO TABS
50.0000 mg | ORAL_TABLET | Freq: Four times a day (QID) | ORAL | Status: DC | PRN
  Administered 2018-06-24: 50 mg via ORAL
  Filled 2018-06-24: qty 1

## 2018-06-24 MED ORDER — POLYETHYLENE GLYCOL 3350 17 G PO PACK: 17.0000 g | PACK | Freq: Every day | ORAL | Status: DC | PRN

## 2018-06-24 MED ORDER — KETOROLAC TROMETHAMINE 15 MG/ML IJ SOLN
15.0000 mg | Freq: Four times a day (QID) | INTRAMUSCULAR | Status: DC
  Administered 2018-06-24 – 2018-06-25 (×3): 15 mg via INTRAVENOUS
  Filled 2018-06-24 (×3): qty 1

## 2018-06-24 MED ORDER — ASPIRIN EC 81 MG PO TBEC
81.0000 mg | DELAYED_RELEASE_TABLET | Freq: Every evening | ORAL | Status: DC
  Administered 2018-06-24: 81 mg via ORAL
  Filled 2018-06-24: qty 1

## 2018-06-24 MED ORDER — DOXYCYCLINE HYCLATE 100 MG PO TABS
100.0000 mg | ORAL_TABLET | Freq: Two times a day (BID) | ORAL | Status: DC
  Administered 2018-06-24 – 2018-06-25 (×2): 100 mg via ORAL
  Filled 2018-06-24 (×2): qty 1

## 2018-06-24 MED ORDER — AMLODIPINE BESYLATE 5 MG PO TABS
5.0000 mg | ORAL_TABLET | Freq: Every evening | ORAL | Status: DC
  Administered 2018-06-24: 5 mg via ORAL
  Filled 2018-06-24: qty 1

## 2018-06-24 MED ORDER — DOCUSATE SODIUM 100 MG PO CAPS
100.0000 mg | ORAL_CAPSULE | Freq: Two times a day (BID) | ORAL | Status: DC
  Administered 2018-06-24 – 2018-06-25 (×2): 100 mg via ORAL
  Filled 2018-06-24 (×2): qty 1

## 2018-06-24 MED ORDER — TAMSULOSIN HCL 0.4 MG PO CAPS
0.4000 mg | ORAL_CAPSULE | Freq: Every day | ORAL | Status: DC
  Administered 2018-06-24: 0.4 mg via ORAL
  Filled 2018-06-24: qty 1

## 2018-06-24 MED ORDER — VANCOMYCIN HCL 10 G IV SOLR
1500.0000 mg | Freq: Once | INTRAVENOUS | Status: AC
  Administered 2018-06-24: 1500 mg via INTRAVENOUS
  Filled 2018-06-24: qty 1500

## 2018-06-24 MED ORDER — SODIUM CHLORIDE 0.9% FLUSH: 3.0000 mL | Freq: Two times a day (BID) | INTRAVENOUS | Status: DC

## 2018-06-24 MED ORDER — FENTANYL CITRATE (PF) 100 MCG/2ML IJ SOLN: 50.0000 ug | Freq: Once | INTRAMUSCULAR | Status: DC

## 2018-06-24 MED ORDER — ONDANSETRON HCL 4 MG/2ML IJ SOLN: 4.0000 mg | Freq: Four times a day (QID) | INTRAMUSCULAR | Status: DC | PRN

## 2018-06-24 MED ORDER — TIZANIDINE HCL 4 MG PO TABS
2.0000 mg | ORAL_TABLET | Freq: Once | ORAL | Status: AC
  Administered 2018-06-24: 2 mg via ORAL
  Filled 2018-06-24: qty 1

## 2018-06-24 NOTE — ED Triage Notes (Addendum)
Patient presented to ed from home with c/o neck with movement  and shoulder pain. Patient state it started this am. Patient have a fever of 101.8 per ems. Patient denies any other complain. Patient was given 1000 mg of tylenol per ems.

## 2018-06-24 NOTE — ED Notes (Signed)
Bed: WA02 Expected date:  Expected time:  Means of arrival:  Comments: 85 yo neck and shoulder pain, fever

## 2018-06-24 NOTE — ED Notes (Signed)
Lumbar Puncture Tray set up at bedside.

## 2018-06-24 NOTE — H&P (Signed)
[facility] History and Physical   Patient: Manuel [name] PPJ:[id number]   PCP: Dorothyann Peng, MD DOB: July 06, 1932   DOA: 06/24/2018   DOS: 06/24/2018   DOS: the patient was seen and examined on 06/24/2018  Patient coming from: The patient is coming from home.  Chief Complaint: Fever neck pain  HPI: Manuel [name] is a 85 y.o. male with Past medical history of Alzheimer's disease, hypertension, BPH. The patient presented to the hospital with complaints of neck pain. Patient reports that he has been having some cough and shortness of breath ongoing for last 3 days. This morning he woke up with severe neck pain.  Denies any nausea or vomiting.  The light was bothering him.  The noise was bothering him.  And he started having some fever and therefore he called EMS. No loss of control of bowel or bladder.  No focal deficit but no chest pain abdominal pain. After extensive work-up and treatment in the ER at the time of my evaluation on the floor the patient did not have any fever. His neck pain was better.  Again no focal deficit reported.  No fall no trauma no injury reported.  No similar neck pain in the past.  ED Course: Presents with fever.  EMS gave the patient 1000 mg of Tylenol.  Patient did not have any fever here in the ER.  Chest x-ray UA sepsis work-up was initiated. Patient was initially confused and lethargic on arrival and therefore LP was performed.  Patient was initially started on IV vancomycin and ceftriaxone for meningitis. Influenza PCR was negative. Patient was admitted for further work-up of his fever and neck pain.  At his baseline ambulates with cane And is independent for most of his ADL; manages his medication on his own.  Review of Systems: as mentioned in the history of present illness.  All other systems reviewed and are negative.  Past Medical History:  Diagnosis Date  . Alzheimer's dementia (HCC)   . Hypertension   . Prostate disorder    Past  Surgical History:  Procedure Laterality Date  . TOTAL HIP ARTHROPLASTY     Social History:  reports that he has quit smoking. He quit after 2.00 years of use. He has never used smokeless tobacco. He reports that he does not drink alcohol or use drugs.  Allergies  Allergen Reactions  . Penicillins Swelling    Did it involve swelling of the face/tongue/throat, SOB, or low BP? Yes Did it involve sudden or severe rash/hives, skin peeling, or any reaction on the inside of your mouth or nose? No Did you need to seek medical attention at a hospital or doctor's office? No When did it last happen?2013 If all above answers are "NO", may proceed with cephalosporin use.      Family History  Problem Relation Age of Onset  . Early death Mother   . Prostate cancer Father      Prior to Admission medications   Medication Sig Start Date End Date Taking? Authorizing Provider  acetaminophen (TYLENOL) 500 MG tablet Take 1,000 mg by mouth every 6 (six) hours as needed for moderate pain.   Yes [provider]  amLODipine (NORVASC) 5 MG tablet Take 5 mg by mouth every evening.  04/03/18  Yes [provider]  aspirin EC 81 MG tablet Take 81 mg by mouth every evening.    Yes [provider]  cholecalciferol (VITAMIN D3) 25 MCG (1000 UT) tablet Take 2,000 Units by mouth  every evening.   Yes [provider]  losartan-hydrochlorothiazide (HYZAAR) 100-25 MG tablet Take 1 tablet by mouth daily. 04/05/18  Yes Dorothyann Peng, MD  tamsulosin (FLOMAX) 0.4 MG CAPS capsule Take 0.4 mg by mouth daily after supper.   Yes [provider]  vitamin E 200 UNIT capsule Take 200 Units by mouth every evening.   Yes [provider]  diazepam (VALIUM) 2 MG tablet Take 1 tablet (2 mg total) by mouth every 6 (six) hours as needed for muscle spasms. Patient not taking: Reported on 06/24/2018 09/01/13   Lorre Nick, MD    Physical Exam: Vitals:   06/24/18 1530 06/24/18  1545 06/24/18 1721 06/24/18 2009  BP: 113/70  122/72 121/69  Pulse:  74 68 74  Resp:    18  Temp:   98.4 F (36.9 C) 98.4 F (36.9 C)  TempSrc:   Oral Oral  SpO2:  98% 100% 99%  Weight:      Height:        General: Alert, Awake and Oriented to Time, Place and Person. Appear in mild distress, affect appropriate Eyes: PERRL, Conjunctiva normal ENT: Oral Mucosa clear moist. Neck: no JVD, no Abnormal Mass Or lumps, decreased range of motion of neck in all direction Cardiovascular: S1 and S2 Present, aortic systolic  Murmur, Peripheral Pulses Present Respiratory: normal respiratory effort, Bilateral Air entry equal and Decreased, no use of accessory muscle, Clear to Auscultation, no Crackles, no wheezes Abdomen: Bowel Sound present, Soft and no tenderness, no hernia Skin: no redness, no Rash, no induration Extremities: no Pedal edema, no calf tenderness Neurologic: Grossly no focal neuro deficit. Bilaterally Equal motor strength  Labs on Admission:  CBC: Recent Labs  Lab 06/24/18 0925 06/24/18 0954  WBC 6.1  --   NEUTROABS 4.2  --   HGB 10.4* 11.6*  HCT 33.3* 34.0*  MCV 94.3  --   PLT 213  --    Basic Metabolic Panel: Recent Labs  Lab 06/24/18 0925 06/24/18 0954 06/24/18 1002  NA 135 136  --   K 3.1* 3.2*  --   CL 101  --   --   CO2 25  --   --   GLUCOSE 110*  --   --   BUN 20  --   --   CREATININE 1.18  --  1.20  CALCIUM 9.0  --   --    GFR: Estimated Creatinine Clearance: 45.3 mL/min (by C-G formula based on SCr of 1.2 mg/dL). Liver Function Tests: Recent Labs  Lab 06/24/18 0925  AST 16  ALT 13  ALKPHOS 53  BILITOT 0.6  PROT 7.1  ALBUMIN 3.5   No results for input(s): LIPASE, AMYLASE in the last 168 hours. No results for input(s): AMMONIA in the last 168 hours. Coagulation Profile: Recent Labs  Lab 06/24/18 0925  INR 1.0   Cardiac Enzymes: No results for input(s): CKTOTAL, CKMB, CKMBINDEX, TROPONINI in the last 168 hours. BNP (last 3  results) No results for input(s): PROBNP in the last 8760 hours. HbA1C: No results for input(s): HGBA1C in the last 72 hours. CBG: No results for input(s): GLUCAP in the last 168 hours. Lipid Profile: No results for input(s): CHOL, HDL, LDLCALC, TRIG, CHOLHDL, LDLDIRECT in the last 72 hours. Thyroid Function Tests: No results for input(s): TSH, T4TOTAL, FREET4, T3FREE, THYROIDAB in the last 72 hours. Anemia Panel: No results for input(s): VITAMINB12, FOLATE, FERRITIN, TIBC, IRON, RETICCTPCT in the last 72 hours. Urine analysis:  Component Value Date/Time   COLORURINE YELLOW 06/24/2018 0925   APPEARANCEUR CLEAR 06/24/2018 0925   LABSPEC 1.024 06/24/2018 0925   PHURINE 6.0 06/24/2018 0925   GLUCOSEU NEGATIVE 06/24/2018 0925   HGBUR NEGATIVE 06/24/2018 0925   BILIRUBINUR NEGATIVE 06/24/2018 0925   KETONESUR NEGATIVE 06/24/2018 0925   PROTEINUR 30 (A) 06/24/2018 0925   UROBILINOGEN 1.0 05/22/2009 0751   NITRITE NEGATIVE 06/24/2018 0925   LEUKOCYTESUR NEGATIVE 06/24/2018 0925    Radiological Exams on Admission: Ct Head Wo Contrast  Result Date: 06/24/2018 CLINICAL DATA:  Neck pain and fevers EXAM: CT HEAD WITHOUT CONTRAST TECHNIQUE: Contiguous axial images were obtained from the base of the skull through the vertex without intravenous contrast. COMPARISON:  05/19/2009 FINDINGS: Brain: Mild atrophic changes are noted. Chronic white matter ischemic change is seen. These changes have progressed somewhat in the interval from the prior exam. No acute hemorrhage, acute infarction or space-occupying mass lesion is noted. Vascular: No hyperdense vessel or unexpected calcification. Skull: Normal. Negative for fracture or focal lesion. Sinuses/Orbits: No acute finding. Other: None. IMPRESSION: Chronic atrophic and ischemic changes without acute abnormality Electronically Signed   By: Alcide Clever M.D.   On: 06/24/2018 11:23   Mr Cervical Spine W Or Wo Contrast  Result Date: 06/24/2018 CLINICAL  DATA:  Patient presented to ed from home with complaint of neck pain with movement and shoulder pain. Patient state it started this morning. EXAM: MRI CERVICAL SPINE WITHOUT AND WITH CONTRAST TECHNIQUE: Multiplanar and multiecho pulse sequences of the cervical spine, to include the craniocervical junction and cervicothoracic junction, were obtained without and with intravenous contrast. CONTRAST:  10 mL Gadavist COMPARISON:  None. FINDINGS: Alignment: 1-2 mm anterolisthesis of T1 on T2. Vertebrae: No fracture, evidence of discitis, or bone lesion. Cord: Normal signal and morphology. Faint relative enhancement along the left periphery of the spinal cord at the level of C2-3 on the sagittal images not seen on any other sequence likely artifactual. Posterior Fossa, vertebral arteries, paraspinal tissues: Posterior fossa demonstrates no focal abnormality. Vertebral artery flow voids are maintained. Paraspinal soft tissues are unremarkable. Disc levels: Discs: Degenerative disc disease with disc height loss at C6-7. C2-3: No significant disc bulge. Mild left facet arthropathy. Mild left foraminal stenosis. No right foraminal stenosis. No central canal stenosis. C3-4: No significant disc bulge. Moderate bilateral facet arthropathy. Moderate-severe bilateral foraminal stenosis. Moderate spinal stenosis. C4-5: No significant disc bulge. Moderate right and mild left facet arthropathy. Moderate right foraminal stenosis. Mild left foraminal stenosis. No central canal stenosis. C5-6: No significant disc bulge. Severe right and moderate left facet arthropathy. Severe right foraminal stenosis. Mild left foraminal stenosis. No central canal stenosis. C6-7: Broad-based disc osteophyte complex. Mild bilateral facet arthropathy. Bilateral uncovertebral degenerative changes. Moderate-severe bilateral foraminal stenosis. Mild spinal stenosis. C7-T1: No significant disc bulge. Moderate bilateral facet arthropathy. Mild right foraminal  stenosis. No left foraminal stenosis. No central canal stenosis. IMPRESSION: 1. No discitis of the cervical spine. 2.  No acute osseous injury of the cervical spine. 3. Diffuse cervical spine spondylosis as described above. Electronically Signed   By: Elige Ko   On: 06/24/2018 13:32   Dg Chest Port 1 View  Result Date: 06/24/2018 CLINICAL DATA:  Acute fever and cough. EXAM: PORTABLE CHEST 1 VIEW COMPARISON:  05/19/2009 chest radiograph FINDINGS: The cardiomediastinal silhouette is unremarkable. Apparent LEFT LOWER lung retrocardiac opacity noted and may represent airspace disease or atelectasis. The RIGHT lung is clear. No pneumothorax or acute bony abnormality. IMPRESSION: Question LEFT LOWER  lung retrocardiac opacity which may represent airspace disease or atelectasis. Electronically Signed   By: Harmon Pier M.D.   On: 06/24/2018 10:18   Assessment/Plan 1.  Neck pain. Likely musculoskeletal. Initially suspected to have meningitis. MRI of the neck is negative for any acute abnormality. LP was performed which was also negative for any acute abnormality. Will treat symptomatically with scheduled NSAIDs, muscle relaxant and as needed narcotics. PT OT consulted.  2.  Acute sinusitis. Patient has stuffed nose, cough dry in nature. Presented with fever. Suspect to have acute sinusitis per Chest x-ray clear. No significant abnormality on examination as well per We will treat with oral doxycycline given his history of penicillin allergy. Total 5-day treatment course. Influenza PCR negative. Initially suspected for meningitis and underwent LP although no pleocytosis and therefore no antibiotic.  Nutrition: regular diet DVT Prophylaxis: subcutaneous Heparin  Advance goals of care discussion: full code   Consults: none  Family Communication: family was present at bedside, at the time of interview.  Opportunity was given to ask question and all questions were answered satisfactorily.    Disposition: Admitted as observation, med-surge unit. Likely to be discharged home, in 1 days.  Author: Lynden Oxford, MD [facility] 06/24/2018  To reach On-call, see care teams to locate the attending and reach out to them via [email or web address]. If 7PM-7AM, please contact night-coverage If you still have difficulty reaching the attending provider, please page the [facility] (Director on Call) for [facility] on amion for assistance.

## 2018-06-24 NOTE — ED Provider Notes (Signed)
[facility]-EMERGENCY DEPT Provider Note   CSN: [id number] Arrival date & time: 06/24/18  0910    History   Chief Complaint Chief Complaint  Patient presents with  . Neck Pain  . Fever    HPI Manuel [name] is a 85 y.o. male.     The history is provided by the patient, a relative and medical records. No language interpreter was used.  Neck Pain  Associated symptoms: fever   Fever   Manuel [name] is a 85 y.o. male who presents to the Emergency Department complaining of fever, neck pain. He presents to the emergency department accompanied by family for evaluation of fever and neck pain. Symptoms began this morning upon waking. Family note and oral temperature to 101.9 as well as severe neck pain. He states it hurts to move his neck in any direction. It hurts slightly more when turning to the right. He thinks he may have slept funny. He has had a very mild cough for the last 2 to 3 days. He has had sick contacts at home with two family members recently treated for pneumonia. He denies any injuries. He was feeling well yesterday. He has mild headache. No difficulty breathing, chest pain, abdominal pain, nausea, vomiting, numbness, weakness. No prior similar symptoms. No recent surgeries, procedures were injections. No history of bacteremia. He did receive Tylenol prior to ED arrival.  Past Medical History:  Diagnosis Date  . Alzheimer's dementia (HCC)   . Hypertension   . Prostate disorder     Patient Active Problem List   Diagnosis Date Noted  . FUO (fever of unknown origin) 06/24/2018  . Essential hypertension, benign 06/06/2018  . Other abnormal glucose 06/06/2018  . Pure hypercholesterolemia 06/06/2018    Past Surgical History:  Procedure Laterality Date  . TOTAL HIP ARTHROPLASTY          Home Medications    Prior to Admission medications   Medication Sig Start Date End Date Taking? Authorizing Provider  acetaminophen (TYLENOL) 500 MG  tablet Take 1,000 mg by mouth every 6 (six) hours as needed for moderate pain.   Yes [provider]  amLODipine (NORVASC) 5 MG tablet Take 5 mg by mouth every evening.  04/03/18  Yes [provider]  aspirin EC 81 MG tablet Take 81 mg by mouth every evening.    Yes [provider]  cholecalciferol (VITAMIN D3) 25 MCG (1000 UT) tablet Take 2,000 Units by mouth every evening.   Yes [provider]  losartan-hydrochlorothiazide (HYZAAR) 100-25 MG tablet Take 1 tablet by mouth daily. 04/05/18  Yes Dorothyann Peng, MD  tamsulosin (FLOMAX) 0.4 MG CAPS capsule Take 0.4 mg by mouth daily after supper.   Yes [provider]  vitamin E 200 UNIT capsule Take 200 Units by mouth every evening.   Yes [provider]  diazepam (VALIUM) 2 MG tablet Take 1 tablet (2 mg total) by mouth every 6 (six) hours as needed for muscle spasms. Patient not taking: Reported on 06/24/2018 09/01/13   Lorre Nick, MD    Family History Family History  Problem Relation Age of Onset  . Early death Mother   . Prostate cancer Father     Social History Social History   Tobacco Use  . Smoking status: Former Smoker    Years: 2.00  . Smokeless tobacco: Never Used  Substance Use Topics  . Alcohol use: No  . Drug use: No     Allergies   Penicillins  Review of Systems Review of Systems  Constitutional: Positive for fever.  Musculoskeletal: Positive for neck pain.  All other systems reviewed and are negative.    Physical Exam Updated Vital Signs BP 113/70   Pulse 77   Temp 98.3 F (36.8 C) (Rectal)   Resp 15   Ht 5\' 4"  (1.626 m)   Wt 83 kg   SpO2 93%   BMI 31.41 kg/m   Physical Exam Vitals signs and nursing note reviewed.  Constitutional:      Appearance: He is well-developed.  HENT:     Head: Normocephalic and atraumatic.     Mouth/Throat:     Mouth: Mucous membranes are moist.     Pharynx: No posterior oropharyngeal erythema.  Neck:      Comments: Mild generalized cervical tenderness to palpation, greatest over the left. Spinous region as well as midline cervical tenderness. Unable to range neck. Cardiovascular:     Rate and Rhythm: Normal rate and regular rhythm.     Heart sounds: No murmur.  Pulmonary:     Effort: Pulmonary effort is normal. No respiratory distress.     Comments: Coarse breath sounds bilaterally Abdominal:     Palpations: Abdomen is soft.     Tenderness: There is no abdominal tenderness. There is no guarding or rebound.  Musculoskeletal:        General: No tenderness.  Skin:    General: Skin is warm and dry.  Neurological:     Mental Status: He is alert.     Comments: Mildly confused.  5/5 strength in all four extremities with sensation to light touch intact in all four extremities.  No asymmetry of facial muscles.    Psychiatric:        Behavior: Behavior normal.      ED Treatments / Results  Labs (all labs ordered are listed, but only abnormal results are displayed) Labs Reviewed  COMPREHENSIVE METABOLIC PANEL - Abnormal; Notable for the following components:      Result Value   Potassium 3.1 (*)    Glucose, Bld 110 (*)    GFR calc non Af Amer 57 (*)    All other components within normal limits  CBC WITH DIFFERENTIAL/PLATELET - Abnormal; Notable for the following components:   RBC 3.53 (*)    Hemoglobin 10.4 (*)    HCT 33.3 (*)    All other components within normal limits  URINALYSIS, ROUTINE W REFLEX MICROSCOPIC - Abnormal; Notable for the following components:   Protein, ur 30 (*)    Bacteria, UA RARE (*)    All other components within normal limits  POCT I-STAT EG7 - Abnormal; Notable for the following components:   pCO2, Ven 40.9 (*)    pO2, Ven 26.0 (*)    Potassium 3.2 (*)    HCT 34.0 (*)    Hemoglobin 11.6 (*)    All other components within normal limits  CULTURE, BLOOD (ROUTINE X 2)  CULTURE, BLOOD (ROUTINE X 2)  CSF CULTURE  GRAM STAIN  LACTIC ACID, PLASMA  LACTIC  ACID, PLASMA  INFLUENZA PANEL BY PCR (TYPE A & B)  PROTIME-INR  CSF CELL COUNT WITH DIFFERENTIAL  CSF CELL COUNT WITH DIFFERENTIAL  GLUCOSE, CSF  PROTEIN, CSF  HERPES SIMPLEX VIRUS(HSV) DNA BY PCR  CRYPTOCOCCAL ANTIGEN, CSF  I-STAT CREATININE, ED    EKG EKG Interpretation  Date/Time:  Sunday June 24 2018 09:29:56 EST Ventricular Rate:  82 PR Interval:    QRS Duration: 93 QT Interval:  363 QTC Calculation: 424 R Axis:   4 Text Interpretation:  Sinus rhythm Multiple ventricular premature complexes Confirmed by Tilden Fossaees, Emmogene Simson [id number]) on 06/24/2018 9:36:02 AM   Radiology Ct Head Wo Contrast  Result Date: 06/24/2018 CLINICAL DATA:  Neck pain and fevers EXAM: CT HEAD WITHOUT CONTRAST TECHNIQUE: Contiguous axial images were obtained from the base of the skull through the vertex without intravenous contrast. COMPARISON:  05/19/2009 FINDINGS: Brain: Mild atrophic changes are noted. Chronic white matter ischemic change is seen. These changes have progressed somewhat in the interval from the prior exam. No acute hemorrhage, acute infarction or space-occupying mass lesion is noted. Vascular: No hyperdense vessel or unexpected calcification. Skull: Normal. Negative for fracture or focal lesion. Sinuses/Orbits: No acute finding. Other: None. IMPRESSION: Chronic atrophic and ischemic changes without acute abnormality Electronically Signed   By: Alcide CleverMark  Lukens M.D.   On: 06/24/2018 11:23   Mr Cervical Spine W Or Wo Contrast  Result Date: 06/24/2018 CLINICAL DATA:  Patient presented to ed from home with complaint of neck pain with movement and shoulder pain. Patient state it started this morning. EXAM: MRI CERVICAL SPINE WITHOUT AND WITH CONTRAST TECHNIQUE: Multiplanar and multiecho pulse sequences of the cervical spine, to include the craniocervical junction and cervicothoracic junction, were obtained without and with intravenous contrast. CONTRAST:  10 mL Gadavist COMPARISON:  None. FINDINGS: Alignment:  1-2 mm anterolisthesis of T1 on T2. Vertebrae: No fracture, evidence of discitis, or bone lesion. Cord: Normal signal and morphology. Faint relative enhancement along the left periphery of the spinal cord at the level of C2-3 on the sagittal images not seen on any other sequence likely artifactual. Posterior Fossa, vertebral arteries, paraspinal tissues: Posterior fossa demonstrates no focal abnormality. Vertebral artery flow voids are maintained. Paraspinal soft tissues are unremarkable. Disc levels: Discs: Degenerative disc disease with disc height loss at C6-7. C2-3: No significant disc bulge. Mild left facet arthropathy. Mild left foraminal stenosis. No right foraminal stenosis. No central canal stenosis. C3-4: No significant disc bulge. Moderate bilateral facet arthropathy. Moderate-severe bilateral foraminal stenosis. Moderate spinal stenosis. C4-5: No significant disc bulge. Moderate right and mild left facet arthropathy. Moderate right foraminal stenosis. Mild left foraminal stenosis. No central canal stenosis. C5-6: No significant disc bulge. Severe right and moderate left facet arthropathy. Severe right foraminal stenosis. Mild left foraminal stenosis. No central canal stenosis. C6-7: Broad-based disc osteophyte complex. Mild bilateral facet arthropathy. Bilateral uncovertebral degenerative changes. Moderate-severe bilateral foraminal stenosis. Mild spinal stenosis. C7-T1: No significant disc bulge. Moderate bilateral facet arthropathy. Mild right foraminal stenosis. No left foraminal stenosis. No central canal stenosis. IMPRESSION: 1. No discitis of the cervical spine. 2.  No acute osseous injury of the cervical spine. 3. Diffuse cervical spine spondylosis as described above. Electronically Signed   By: Elige KoHetal  Patel   On: 06/24/2018 13:32   Dg Chest Port 1 View  Result Date: 06/24/2018 CLINICAL DATA:  Acute fever and cough. EXAM: PORTABLE CHEST 1 VIEW COMPARISON:  05/19/2009 chest radiograph FINDINGS:  The cardiomediastinal silhouette is unremarkable. Apparent LEFT LOWER lung retrocardiac opacity noted and may represent airspace disease or atelectasis. The RIGHT lung is clear. No pneumothorax or acute bony abnormality. IMPRESSION: Question LEFT LOWER lung retrocardiac opacity which may represent airspace disease or atelectasis. Electronically Signed   By: Harmon PierJeffrey  Hu M.D.   On: 06/24/2018 10:18    Procedures .Lumbar Puncture Date/Time: 06/24/2018 3:45 PM Performed by: Tilden Fossaees, Lamaj Metoyer, MD Authorized by: Tilden Fossaees, Keerstin Bjelland, MD   Consent:    Consent obtained:  Verbal and written   Consent given by:  Patient and guardian   Risks discussed:  Bleeding, infection, pain, headache, nerve damage and repeat procedure   Alternatives discussed:  Alternative treatment Pre-procedure details:    Procedure purpose:  Diagnostic Anesthesia (see MAR for exact dosages):    Anesthesia method:  Local infiltration   Local anesthetic:  Lidocaine 1% w/o epi Procedure details:    Lumbar space:  L4-L5 interspace   Patient position:  L lateral decubitus   Needle gauge:  22   Needle length (in):  3.5   Number of attempts:  1   Fluid appearance:  Clear   Tubes of fluid:  4   Total volume (ml):  4 Post-procedure:    Puncture site:  Adhesive bandage applied   Patient tolerance of procedure:  Tolerated well, no immediate complications   (including critical care time) CRITICAL CARE Performed by: Tilden Fossa   Total critical care time: 35 minutes  Critical care time was exclusive of separately billable procedures and treating other patients.  Critical care was necessary to treat or prevent imminent or life-threatening deterioration.  Critical care was time spent personally by me on the following activities: development of treatment plan with patient and/or surrogate as well as nursing, discussions with consultants, evaluation of patient's response to treatment, examination of patient, obtaining history from  patient or surrogate, ordering and performing treatments and interventions, ordering and review of laboratory studies, ordering and review of radiographic studies, pulse oximetry and re-evaluation of patient's condition.  Medications Ordered in ED Medications  iopamidol (ISOVUE-300) 61 % injection (  Hold 06/24/18 1056)  sodium chloride (PF) 0.9 % injection (0 mLs  Hold 06/24/18 1056)  lidocaine (XYLOCAINE) 1 % (with pres) injection 10 mL (has no administration in time range)  cefTRIAXone (ROCEPHIN) 2 g in sodium chloride 0.9 % 100 mL IVPB (0 g Intravenous Stopped 06/24/18 1327)  dexamethasone (DECADRON) injection 10 mg (10 mg Intravenous Given 06/24/18 1017)  vancomycin (VANCOCIN) 1,500 mg in sodium chloride 0.9 % 500 mL IVPB (0 mg Intravenous Stopped 06/24/18 1432)  gadobutrol (GADAVIST) 1 MMOL/ML injection 10 mL (10 mLs Intravenous Contrast Given 06/24/18 1257)  tiZANidine (ZANAFLEX) tablet 2 mg (2 mg Oral Given 06/24/18 1440)  lidocaine (XYLOCAINE) 1 % (with pres) injection (20 mLs  Given by Other 06/24/18 1521)     Initial Impression / Assessment and Plan / ED Course  I have reviewed the triage vital signs and the nursing notes.  Pertinent labs & imaging results that were available during my care of the patient were reviewed by me and considered in my medical decision making (see chart for details).        Patient presents to the emergency department for evaluation of fever, headache, neck pain. Patient meningitic on presentation. Initial concern for possible meningitis versus RPA versus epidural abscess. He has recently been exposed to pneumonia. He was empirically treated with antibiotics on ED presentation. Lumbar puncture was performed after MRI was obtained and was found to be negative for epidural abscess. Chest x-ray does demonstrate a possible pneumonia. Plan to admit to the hospital service for further evaluation and management.  Final Clinical Impressions(s) / ED Diagnoses   Final  diagnoses:  None    ED Discharge Orders    None       Tilden Fossa, MD 06/24/18 1625

## 2018-06-24 NOTE — ED Notes (Signed)
Istat 7 given to MD and RN.

## 2018-06-24 NOTE — ED Notes (Signed)
RN Lucrezia Europe will be walking down CSF Tubes to Main Lab.

## 2018-06-24 NOTE — ED Notes (Signed)
MRI called at 11:00.

## 2018-06-24 NOTE — ED Notes (Signed)
ED TO INPATIENT HANDOFF REPORT  Name/Age/Gender Manuel [name] 85 y.o. male  Code Status   Home/SNF/Other Home  Chief Complaint Neck Pain;Fever  Level of Care/Admitting Diagnosis ED Disposition    ED Disposition Condition Comment   Admit  Hospital Area: [facility] [[id number]]  Level of Care: Med-Surg [16]  Diagnosis: FUO (fever of unknown origin) [161096]  Admitting Physician: Rolly Salter [[id number]]  Attending Physician: Rolly Salter [[id number]]  PT Class (Do Not Modify): Observation [104]  PT Acc Code (Do Not Modify): Observation [10022]       Medical History Past Medical History:  Diagnosis Date  . Alzheimer's dementia (HCC)   . Hypertension   . Prostate disorder     Allergies Allergies  Allergen Reactions  . Penicillins Swelling    Did it involve swelling of the face/tongue/throat, SOB, or low BP? Yes Did it involve sudden or severe rash/hives, skin peeling, or any reaction on the inside of your mouth or nose? No Did you need to seek medical attention at a hospital or doctor's office? No When did it last happen?2013 If all above answers are "NO", may proceed with cephalosporin use.     IV Location/Drains/Wounds Patient Lines/Drains/Airways Status   Active Line/Drains/Airways    Name:   Placement date:   Placement time:   Site:   Days:   Peripheral IV 06/24/18 Right Antecubital   06/24/18    1012    Antecubital   less than 1   Peripheral IV 06/24/18 Right Forearm   06/24/18    1013    Forearm   less than 1          Labs/Imaging Results for orders placed or performed during the hospital encounter of 06/24/18 (from the past 48 hour(s))  Lactic acid, plasma     Status: None   Collection Time: 06/24/18  9:25 AM  Result Value Ref Range   Lactic Acid, Venous 1.8 0.5 - 1.9 mmol/L    Comment: Performed at [facility], [address], [place], [place] [address]  Comprehensive metabolic panel     Status:  Abnormal   Collection Time: 06/24/18  9:25 AM  Result Value Ref Range   Sodium 135 135 - 145 mmol/L   Potassium 3.1 (L) 3.5 - 5.1 mmol/L   Chloride 101 98 - 111 mmol/L   CO2 25 22 - 32 mmol/L   Glucose, Bld 110 (H) 70 - 99 mg/dL   BUN 20 8 - 23 mg/dL   Creatinine, Ser 1.30 0.61 - 1.24 mg/dL   Calcium 9.0 8.9 - 86.5 mg/dL   Total Protein 7.1 6.5 - 8.1 g/dL   Albumin 3.5 3.5 - 5.0 g/dL   AST 16 15 - 41 U/L   ALT 13 0 - 44 U/L   Alkaline Phosphatase 53 38 - 126 U/L   Total Bilirubin 0.6 0.3 - 1.2 mg/dL   GFR calc non Af Amer 57 (L) >60 mL/min   GFR calc Af Amer >60 >60 mL/min   Anion gap 9 5 - 15    Comment: Performed at [facility], [address] [place], [place] [address]  CBC WITH DIFFERENTIAL     Status: Abnormal   Collection Time: 06/24/18  9:25 AM  Result Value Ref Range   WBC 6.1 4.0 - 10.5 K/uL   RBC 3.53 (L) 4.22 - 5.81 MIL/uL   Hemoglobin 10.4 (L) 13.0 - 17.0 g/dL   HCT 62.9 (L) 52.8 -  52.0 %   MCV 94.3 80.0 - 100.0 fL   MCH 29.5 26.0 - 34.0 pg   MCHC 31.2 30.0 - 36.0 g/dL   RDW 16.1 09.6 - 04.5 %   Platelets 213 150 - 400 K/uL   nRBC 0.0 0.0 - 0.2 %   Neutrophils Relative % 68 %   Neutro Abs 4.2 1.7 - 7.7 K/uL   Lymphocytes Relative 22 %   Lymphs Abs 1.3 0.7 - 4.0 K/uL   Monocytes Relative 9 %   Monocytes Absolute 0.5 0.1 - 1.0 K/uL   Eosinophils Relative 0 %   Eosinophils Absolute 0.0 0.0 - 0.5 K/uL   Basophils Relative 0 %   Basophils Absolute 0.0 0.0 - 0.1 K/uL   Immature Granulocytes 1 %   Abs Immature Granulocytes 0.03 0.00 - 0.07 K/uL    Comment: Performed at [facility], [address], [place], [place] [address]  Urinalysis, Routine w reflex microscopic     Status: Abnormal   Collection Time: 06/24/18  9:25 AM  Result Value Ref Range   Color, Urine YELLOW YELLOW   APPearance CLEAR CLEAR   Specific Gravity, Urine 1.024 1.005 - 1.030   pH 6.0 5.0 - 8.0   Glucose, UA NEGATIVE NEGATIVE mg/dL   Hgb urine  dipstick NEGATIVE NEGATIVE   Bilirubin Urine NEGATIVE NEGATIVE   Ketones, ur NEGATIVE NEGATIVE mg/dL   Protein, ur 30 (A) NEGATIVE mg/dL   Nitrite NEGATIVE NEGATIVE   Leukocytes,Ua NEGATIVE NEGATIVE   RBC / HPF 0-5 0 - 5 RBC/hpf   WBC, UA 0-5 0 - 5 WBC/hpf   Bacteria, UA RARE (A) NONE SEEN   Mucus PRESENT     Comment: Performed at [facility], [address] [place], [place] [address]  Protime-INR     Status: None   Collection Time: 06/24/18  9:25 AM  Result Value Ref Range   Prothrombin Time 13.1 11.4 - 15.2 seconds   INR 1.0 0.8 - 1.2    Comment: (NOTE) INR goal varies based on device and disease states. Performed at [facility], [address] [place], [place] [address]   Influenza panel by PCR (type A & B)     Status: None   Collection Time: 06/24/18  9:50 AM  Result Value Ref Range   Influenza A By PCR NEGATIVE NEGATIVE   Influenza B By PCR NEGATIVE NEGATIVE    Comment: (NOTE) The Xpert Xpress Flu assay is intended as an aid in the diagnosis of  influenza and should not be used as a sole basis for treatment.  This  assay is FDA approved for nasopharyngeal swab specimens only. Nasal  washings and aspirates are unacceptable for Xpert Xpress Flu testing. Performed at [facility], [address] [place], [place] [address]   POCT I-Stat EG7     Status: Abnormal   Collection Time: 06/24/18  9:54 AM  Result Value Ref Range   pH, Ven 7.408 7.250 - 7.430   pCO2, Ven 40.9 (L) 44.0 - 60.0 mmHg   pO2, Ven 26.0 (LL) 32.0 - 45.0 mmHg   Bicarbonate 25.8 20.0 - 28.0 mmol/L   TCO2 27 22 - 32 mmol/L   O2 Saturation 47.0 %   Acid-Base Excess 1.0 0.0 - 2.0 mmol/L   Sodium 136 135 - 145 mmol/L   Potassium 3.2 (L) 3.5 - 5.1 mmol/L   Calcium, Ion 1.18 1.15 - 1.40 mmol/L   HCT 34.0 (L) 39.0 -  52.0 %   Hemoglobin 11.6 (L) 13.0 - 17.0 g/dL   Patient temperature HIDE    Sample type VENOUS    Comment NOTIFIED PHYSICIAN    I-stat Creatinine, ED     Status: None   Collection Time: 06/24/18 10:02 AM  Result Value Ref Range   Creatinine, Ser 1.20 0.61 - 1.24 mg/dL  Lactic acid, plasma     Status: None   Collection Time: 06/24/18  2:38 PM  Result Value Ref Range   Lactic Acid, Venous 0.9 0.5 - 1.9 mmol/L    Comment: Performed at [facility], [address] [place], [place] [address]   Ct Head Wo Contrast  Result Date: 06/24/2018 CLINICAL DATA:  Neck pain and fevers EXAM: CT HEAD WITHOUT CONTRAST TECHNIQUE: Contiguous axial images were obtained from the base of the skull through the vertex without intravenous contrast. COMPARISON:  05/19/2009 FINDINGS: Brain: Mild atrophic changes are noted. Chronic white matter ischemic change is seen. These changes have progressed somewhat in the interval from the prior exam. No acute hemorrhage, acute infarction or space-occupying mass lesion is noted. Vascular: No hyperdense vessel or unexpected calcification. Skull: Normal. Negative for fracture or focal lesion. Sinuses/Orbits: No acute finding. Other: None. IMPRESSION: Chronic atrophic and ischemic changes without acute abnormality Electronically Signed   By: Alcide Clever M.D.   On: 06/24/2018 11:23   Mr Cervical Spine W Or Wo Contrast  Result Date: 06/24/2018 CLINICAL DATA:  Patient presented to ed from home with complaint of neck pain with movement and shoulder pain. Patient state it started this morning. EXAM: MRI CERVICAL SPINE WITHOUT AND WITH CONTRAST TECHNIQUE: Multiplanar and multiecho pulse sequences of the cervical spine, to include the craniocervical junction and cervicothoracic junction, were obtained without and with intravenous contrast. CONTRAST:  10 mL Gadavist COMPARISON:  None. FINDINGS: Alignment: 1-2 mm anterolisthesis of T1 on T2. Vertebrae: No fracture, evidence of discitis, or bone lesion. Cord: Normal signal and morphology. Faint relative enhancement along the left periphery of the spinal  cord at the level of C2-3 on the sagittal images not seen on any other sequence likely artifactual. Posterior Fossa, vertebral arteries, paraspinal tissues: Posterior fossa demonstrates no focal abnormality. Vertebral artery flow voids are maintained. Paraspinal soft tissues are unremarkable. Disc levels: Discs: Degenerative disc disease with disc height loss at C6-7. C2-3: No significant disc bulge. Mild left facet arthropathy. Mild left foraminal stenosis. No right foraminal stenosis. No central canal stenosis. C3-4: No significant disc bulge. Moderate bilateral facet arthropathy. Moderate-severe bilateral foraminal stenosis. Moderate spinal stenosis. C4-5: No significant disc bulge. Moderate right and mild left facet arthropathy. Moderate right foraminal stenosis. Mild left foraminal stenosis. No central canal stenosis. C5-6: No significant disc bulge. Severe right and moderate left facet arthropathy. Severe right foraminal stenosis. Mild left foraminal stenosis. No central canal stenosis. C6-7: Broad-based disc osteophyte complex. Mild bilateral facet arthropathy. Bilateral uncovertebral degenerative changes. Moderate-severe bilateral foraminal stenosis. Mild spinal stenosis. C7-T1: No significant disc bulge. Moderate bilateral facet arthropathy. Mild right foraminal stenosis. No left foraminal stenosis. No central canal stenosis. IMPRESSION: 1. No discitis of the cervical spine. 2.  No acute osseous injury of the cervical spine. 3. Diffuse cervical spine spondylosis as described above. Electronically Signed   By: Elige Ko   On: 06/24/2018 13:32   Dg Chest Port 1 View  Result Date: 06/24/2018 CLINICAL DATA:  Acute fever and cough. EXAM: PORTABLE CHEST 1 VIEW COMPARISON:  05/19/2009 chest radiograph FINDINGS: The cardiomediastinal silhouette is unremarkable.  Apparent LEFT LOWER lung retrocardiac opacity noted and may represent airspace disease or atelectasis. The RIGHT lung is clear. No pneumothorax or  acute bony abnormality. IMPRESSION: Question LEFT LOWER lung retrocardiac opacity which may represent airspace disease or atelectasis. Electronically Signed   By: Harmon Pier M.D.   On: 06/24/2018 10:18   EKG Interpretation  Date/Time:  /01/20 0925          Vitals/Pain Today's Vitals   06/24/18 1430 06/24/18 1500 06/24/18 1530 06/24/18 1545  BP: 138/84 (!) 122/100 113/70   Pulse: 72 77  74  Resp:  13 15    Temp:      TempSrc:      SpO2: 96% 93%  98%  Weight:      Height:      PainSc:        Isolation Precautions Droplet precaution  Medications Medications  iopamidol (ISOVUE-300) 61 % injection (  Hold 06/24/18 1056)  sodium chloride (PF) 0.9 % injection (0 mLs  Hold 06/24/18 1056)  lidocaine (XYLOCAINE) 1 % (with pres) injection 10 mL (has no administration in time range)  cefTRIAXone (ROCEPHIN) 2 g in sodium chloride 0.9 % 100 mL IVPB (0 g Intravenous Stopped 06/24/18 1327)  dexamethasone (DECADRON) injection 10 mg (10 mg Intravenous Given 06/24/18 1017)  vancomycin (VANCOCIN) 1,500 mg in sodium chloride 0.9 % 500 mL IVPB (0 mg Intravenous Stopped 06/24/18 1432)  gadobutrol (GADAVIST) 1  MMOL/ML injection 10 mL (10 mLs Intravenous Contrast Given 06/24/18 1257)  tiZANidine (ZANAFLEX) tablet 2 mg (2 mg Oral Given 06/24/18 1440)  lidocaine (XYLOCAINE) 1 % (with pres) injection (20 mLs  Given by Other 06/24/18 1521)    Mobility walks with device

## 2018-06-24 NOTE — Progress Notes (Signed)
A consult was received from an ED physician for Vancomycin per pharmacy dosing.  The patient's profile has been reviewed for ht/wt/allergies/indication/available labs.    A one time order has been placed for Vancomycin 1500mg .  Further antibiotics/pharmacy consults should be ordered by admitting physician if indicated.                       Thank you, Otho Bellows 06/24/2018  9:50 AM

## 2018-06-24 NOTE — ED Notes (Signed)
Patient transported to MRI 

## 2018-06-25 DIAGNOSIS — R509 Fever, unspecified: Secondary | ICD-10-CM | POA: Diagnosis not present

## 2018-06-25 LAB — CBC
HCT: 32.7 % — ABNORMAL LOW (ref 39.0–52.0)
Hemoglobin: 10.1 g/dL — ABNORMAL LOW (ref 13.0–17.0)
MCH: 29.4 pg (ref 26.0–34.0)
MCHC: 30.9 g/dL (ref 30.0–36.0)
MCV: 95.3 fL (ref 80.0–100.0)
Platelets: 209 10*3/uL (ref 150–400)
RBC: 3.43 MIL/uL — AB (ref 4.22–5.81)
RDW: 13.9 % (ref 11.5–15.5)
WBC: 7.1 10*3/uL (ref 4.0–10.5)
nRBC: 0 % (ref 0.0–0.2)

## 2018-06-25 LAB — COMPREHENSIVE METABOLIC PANEL
ALT: 10 U/L (ref 0–44)
ANION GAP: 8 (ref 5–15)
AST: 13 U/L — ABNORMAL LOW (ref 15–41)
Albumin: 3 g/dL — ABNORMAL LOW (ref 3.5–5.0)
Alkaline Phosphatase: 48 U/L (ref 38–126)
BUN: 32 mg/dL — ABNORMAL HIGH (ref 8–23)
CO2: 22 mmol/L (ref 22–32)
Calcium: 8.5 mg/dL — ABNORMAL LOW (ref 8.9–10.3)
Chloride: 106 mmol/L (ref 98–111)
Creatinine, Ser: 1.26 mg/dL — ABNORMAL HIGH (ref 0.61–1.24)
GFR, EST NON AFRICAN AMERICAN: 52 mL/min — AB (ref >60)
Glucose, Bld: 118 mg/dL — ABNORMAL HIGH (ref 70–99)
Potassium: 3.5 mmol/L (ref 3.5–5.1)
Sodium: 136 mmol/L (ref 135–145)
Total Bilirubin: 0.6 mg/dL (ref 0.3–1.2)
Total Protein: 6.5 g/dL (ref 6.5–8.1)

## 2018-06-25 MED ORDER — TRAMADOL HCL 50 MG PO TABS: 50.0000 mg | ORAL_TABLET | Freq: Four times a day (QID) | ORAL | 0 refills | Status: DC | PRN

## 2018-06-25 MED ORDER — MELATONIN 3 MG PO TABS: 3.0000 mg | ORAL_TABLET | Freq: Every day | ORAL | 0 refills | Status: DC

## 2018-06-25 MED ORDER — CYCLOBENZAPRINE HCL 5 MG PO TABS: 5.0000 mg | ORAL_TABLET | Freq: Three times a day (TID) | ORAL | 0 refills | Status: DC | PRN

## 2018-06-25 MED ORDER — DOXYCYCLINE HYCLATE 100 MG PO TABS: 100.0000 mg | ORAL_TABLET | Freq: Two times a day (BID) | ORAL | 0 refills | Status: AC

## 2018-06-25 MED ORDER — MELATONIN 3 MG PO TABS: 3.0000 mg | ORAL_TABLET | Freq: Every day | ORAL | Status: DC

## 2018-06-25 NOTE — Discharge Summary (Signed)
Physician Discharge Summary  Manuel [name]:[id number] DOB: 14-Dec-1932 DOA: 06/24/2018  PCP: Dorothyann Peng, MD  Admit date: 06/24/2018 Discharge date: 06/25/2018  Admitted From: Home Disposition:  Home  Discharge Condition:Stable CODE STATUS:FULL Diet recommendation: Heart Healthy  Brief/Interim Summary: HPI: Manuel [name] is a 85 y.o. male with Past medical history of Alzheimer's disease, hypertension, BPH. The patient presented to the hospital with complaints of neck pain. Patient reports that he has been having some cough and shortness of breath ongoing for last 3 days. This morning he woke up with severe neck pain.  Denies any nausea or vomiting.  The light was bothering him.  The noise was bothering him.  And he started having some fever and therefore he called EMS. No loss of control of bowel or bladder.  No focal deficit but no chest pain abdominal pain. After extensive work-up and treatment in the ER at the time of my evaluation on the floor the patient did not have any fever. His neck pain was better.  Again no focal deficit reported.  No fall no trauma no injury reported.  No similar neck pain in the past.  ED Course: Presents with fever.  EMS gave the patient 1000 mg of Tylenol.  Patient did not have any fever here in the ER.  Chest x-ray UA sepsis work-up was initiated. Patient was initially confused and lethargic on arrival and therefore LP was performed.  Patient was initially started on IV vancomycin and ceftriaxone for meningitis. Influenza PCR was negative. Patient was admitted for further work-up of his fever and neck pain.  At his baseline ambulates with cane And is independent for most of his ADL; manages his medication on his own.  Hospital Course:  His hospital course remained stable.  This morning his neck pain is much better.  He was started on doxycycline for sinusitis.  He was also evaluated by physical therapy and recommended home health PT. He is  hemodynamically stable for discharge today to home.  Following problems were addressed during his hospitalization:  1.  Neck pain. Likely musculoskeletal. Initially suspected to have meningitis. MRI of the neck is negative for any acute abnormality. LP was performed which was also negative for any acute abnormality. Will treat symptomatically with scheduled  muscle relaxant and as needed narcotics. PT recommended home health.  2.  Acute sinusitis. Patient has stuffed nose, cough dry in nature. Presented with fever. Suspect to have acute sinusitis per Chest x-ray clear. No significant abnormality on examination as well per We will treat with oral doxycycline given his history of penicillin allergy. Total 5-day treatment course. Influenza PCR negative. Initially suspected for meningitis and underwent LP ,findings negative.      Discharge Diagnoses:  Active Problems:   FUO (fever of unknown origin)    Discharge Instructions  Discharge Instructions    Diet - low sodium heart healthy   Complete by:  As directed    Discharge instructions   Complete by:  As directed    1) Please follow up with your PCP in a week.  Do a BMP test during the follow-up. 2)Take prescribed medications as instructed.   Increase activity slowly   Complete by:  As directed      Allergies as of 06/25/2018      Reactions   Penicillins Swelling   Did it involve swelling of the face/tongue/throat, SOB, or low BP? Yes Did it involve sudden or severe rash/hives, skin peeling, or any reaction on the inside  of your mouth or nose? No Did you need to seek medical attention at a hospital or doctor's office? No When did it last happen?2013 If all above answers are "NO", may proceed with cephalosporin use.      Medication List    TAKE these medications   acetaminophen 500 MG tablet Commonly known as:  TYLENOL Take 1,000 mg by mouth every 6 (six) hours as needed for moderate pain.   amLODipine 5  MG tablet Commonly known as:  NORVASC Take 5 mg by mouth every evening.   aspirin EC 81 MG tablet Take 81 mg by mouth every evening.   cholecalciferol 25 MCG (1000 UT) tablet Commonly known as:  VITAMIN D3 Take 2,000 Units by mouth every evening.   cyclobenzaprine 5 MG tablet Commonly known as:  FLEXERIL Take 1 tablet (5 mg total) by mouth 3 (three) times daily as needed for muscle spasms.   diazepam 2 MG tablet Commonly known as:  VALIUM Take 1 tablet (2 mg total) by mouth every 6 (six) hours as needed for muscle spasms.   doxycycline 100 MG tablet Commonly known as:  VIBRA-TABS Take 1 tablet (100 mg total) by mouth every 12 (twelve) hours for 4 days.   losartan-hydrochlorothiazide 100-25 MG tablet Commonly known as:  HYZAAR Take 1 tablet by mouth daily.   Melatonin 3 MG Tabs Take 1 tablet (3 mg total) by mouth at bedtime.   tamsulosin 0.4 MG Caps capsule Commonly known as:  FLOMAX Take 0.4 mg by mouth daily after supper.   traMADol 50 MG tablet Commonly known as:  ULTRAM Take 1 tablet (50 mg total) by mouth every 6 (six) hours as needed for moderate pain.   vitamin E 200 UNIT capsule Take 200 Units by mouth every evening.      Follow-up Information    Dorothyann Peng, MD. Schedule an appointment as soon as possible for a visit in 1 week(s).   Specialty:  Internal Medicine Contact information: [address] [place] [address] [phone]          Allergies  Allergen Reactions  . Penicillins Swelling    Did it involve swelling of the face/tongue/throat, SOB, or low BP? Yes Did it involve sudden or severe rash/hives, skin peeling, or any reaction on the inside of your mouth or nose? No Did you need to seek medical attention at a hospital or doctor's office? No When did it last happen?2013 If all above answers are "NO", may proceed with cephalosporin use.     Consultations:  None   Procedures/Studies: Ct Head Wo  Contrast  Result Date: 06/24/2018 CLINICAL DATA:  Neck pain and fevers EXAM: CT HEAD WITHOUT CONTRAST TECHNIQUE: Contiguous axial images were obtained from the base of the skull through the vertex without intravenous contrast. COMPARISON:  05/19/2009 FINDINGS: Brain: Mild atrophic changes are noted. Chronic white matter ischemic change is seen. These changes have progressed somewhat in the interval from the prior exam. No acute hemorrhage, acute infarction or space-occupying mass lesion is noted. Vascular: No hyperdense vessel or unexpected calcification. Skull: Normal. Negative for fracture or focal lesion. Sinuses/Orbits: No acute finding. Other: None. IMPRESSION: Chronic atrophic and ischemic changes without acute abnormality Electronically Signed   By: Alcide Clever M.D.   On: 06/24/2018 11:23   Mr Cervical Spine W Or Wo Contrast  Result Date: 06/24/2018 CLINICAL DATA:  Patient presented to ed from home with complaint of neck pain with movement and shoulder pain. Patient state it started  this morning. EXAM: MRI CERVICAL SPINE WITHOUT AND WITH CONTRAST TECHNIQUE: Multiplanar and multiecho pulse sequences of the cervical spine, to include the craniocervical junction and cervicothoracic junction, were obtained without and with intravenous contrast. CONTRAST:  10 mL Gadavist COMPARISON:  None. FINDINGS: Alignment: 1-2 mm anterolisthesis of T1 on T2. Vertebrae: No fracture, evidence of discitis, or bone lesion. Cord: Normal signal and morphology. Faint relative enhancement along the left periphery of the spinal cord at the level of C2-3 on the sagittal images not seen on any other sequence likely artifactual. Posterior Fossa, vertebral arteries, paraspinal tissues: Posterior fossa demonstrates no focal abnormality. Vertebral artery flow voids are maintained. Paraspinal soft tissues are unremarkable. Disc levels: Discs: Degenerative disc disease with disc height loss at C6-7. C2-3: No significant disc bulge. Mild  left facet arthropathy. Mild left foraminal stenosis. No right foraminal stenosis. No central canal stenosis. C3-4: No significant disc bulge. Moderate bilateral facet arthropathy. Moderate-severe bilateral foraminal stenosis. Moderate spinal stenosis. C4-5: No significant disc bulge. Moderate right and mild left facet arthropathy. Moderate right foraminal stenosis. Mild left foraminal stenosis. No central canal stenosis. C5-6: No significant disc bulge. Severe right and moderate left facet arthropathy. Severe right foraminal stenosis. Mild left foraminal stenosis. No central canal stenosis. C6-7: Broad-based disc osteophyte complex. Mild bilateral facet arthropathy. Bilateral uncovertebral degenerative changes. Moderate-severe bilateral foraminal stenosis. Mild spinal stenosis. C7-T1: No significant disc bulge. Moderate bilateral facet arthropathy. Mild right foraminal stenosis. No left foraminal stenosis. No central canal stenosis. IMPRESSION: 1. No discitis of the cervical spine. 2.  No acute osseous injury of the cervical spine. 3. Diffuse cervical spine spondylosis as described above. Electronically Signed   By: Elige Ko   On: 06/24/2018 13:32   Dg Chest Port 1 View  Result Date: 06/24/2018 CLINICAL DATA:  Acute fever and cough. EXAM: PORTABLE CHEST 1 VIEW COMPARISON:  05/19/2009 chest radiograph FINDINGS: The cardiomediastinal silhouette is unremarkable. Apparent LEFT LOWER lung retrocardiac opacity noted and may represent airspace disease or atelectasis. The RIGHT lung is clear. No pneumothorax or acute bony abnormality. IMPRESSION: Question LEFT LOWER lung retrocardiac opacity which may represent airspace disease or atelectasis. Electronically Signed   By: Harmon Pier M.D.   On: 06/24/2018 10:18      Subjective: Patient seen and examined the bedside this morning.  Remains comfortable.  Neck pain is better today.  Febrile. Hemodynamically stable for discharge.  Discharge Exam: Vitals:    06/24/18 2009 06/25/18 0545  BP: 121/69 (!) 134/58  Pulse: 74 64  Resp: 18 17  Temp: 98.4 F (36.9 C) 98.4 F (36.9 C)  SpO2: 99% 100%   Vitals:   06/24/18 1545 06/24/18 1721 06/24/18 2009 06/25/18 0545  BP:  122/72 121/69 (!) 134/58  Pulse: 74 68 74 64  Resp:   18 17  Temp:  98.4 F (36.9 C) 98.4 F (36.9 C) 98.4 F (36.9 C)  TempSrc:  Oral Oral Oral  SpO2: 98% 100% 99% 100%  Weight:      Height:        General: Pt is alert, awake, not in acute distress Cardiovascular: RRR, S1/S2 +, no rubs, no gallops Respiratory: CTA bilaterally, no wheezing, no rhonchi Abdominal: Soft, NT, ND, bowel sounds + Extremities: no edema, no cyanosis    The results of significant diagnostics from this hospitalization (including imaging, microbiology, ancillary and laboratory) are listed below for reference.     Microbiology: Recent Results (from the past 240 hour(s))  Blood Culture (routine x 2)  Status: None (Preliminary result)   Collection Time: 06/24/18  9:58 AM  Result Value Ref Range Status   Specimen Description BLOOD RIGHT FOREARM  Final   Special Requests   Final    BOTTLES DRAWN AEROBIC AND ANAEROBIC Blood Culture results may not be optimal due to an excessive volume of blood received in culture bottles Performed at [facility], [address], [place], [place] [address]    Culture NO GROWTH < 12 HOURS  Final   Report Status PENDING  Incomplete  Blood Culture (routine x 2)     Status: None (Preliminary result)   Collection Time: 06/24/18  9:58 AM  Result Value Ref Range Status   Specimen Description BLOOD RIGHT ANTECUBITAL  Final   Special Requests   Final    BOTTLES DRAWN AEROBIC AND ANAEROBIC Blood Culture results may not be optimal due to an excessive volume of blood received in culture bottles Performed at [facility], [address], [place], [place] [address]    Culture NO GROWTH < 12 HOURS  Final   Report Status PENDING   Incomplete  CSF culture     Status: None (Preliminary result)   Collection Time: 06/24/18  3:37 PM  Result Value Ref Range Status   Specimen Description BACK  Final   Special Requests   Final    NONE Performed at [facility], [address], [place], [place] [address]    Culture NO GROWTH < 24 HOURS  Final   Report Status PENDING  Incomplete  Gram stain     Status: None   Collection Time: 06/24/18  3:37 PM  Result Value Ref Range Status   Specimen Description LUMBAR  Final   Special Requests NONE  Final   Gram Stain   Final    NO WBC SEEN NO ORGANISMS SEEN Performed at [facility], [address], [place], [place] [address]    Report Status 06/24/2018 FINAL  Final     Labs: BNP (last 3 results) No results for input(s): BNP in the last 8760 hours. Basic Metabolic Panel: Recent Labs  Lab 06/24/18 0925 06/24/18 0954 06/24/18 1002 06/25/18 0549  NA 135 136  --  136  K 3.1* 3.2*  --  3.5  CL 101  --   --  106  CO2 25  --   --  22  GLUCOSE 110*  --   --  118*  BUN 20  --   --  32*  CREATININE 1.18  --  1.20 1.26*  CALCIUM 9.0  --   --  8.5*   Liver Function Tests: Recent Labs  Lab 06/24/18 0925 06/25/18 0549  AST 16 13*  ALT 13 10  ALKPHOS 53 48  BILITOT 0.6 0.6  PROT 7.1 6.5  ALBUMIN 3.5 3.0*   No results for input(s): LIPASE, AMYLASE in the last 168 hours. No results for input(s): AMMONIA in the last 168 hours. CBC: Recent Labs  Lab 06/24/18 0925 06/24/18 0954 06/25/18 0549  WBC 6.1  --  7.1  NEUTROABS 4.2  --   --   HGB 10.4* 11.6* 10.1*  HCT 33.3* 34.0* 32.7*  MCV 94.3  --  95.3  PLT 213  --  209   Cardiac Enzymes: No results for input(s): CKTOTAL, CKMB, CKMBINDEX, TROPONINI in the last 168 hours. BNP: Invalid input(s): POCBNP CBG: No results for input(s): GLUCAP in the last 168 hours. D-Dimer No results for input(s): DDIMER in  the last 72 hours. Hgb A1c No results for input(s): HGBA1C in the last 72  hours. Lipid Profile No results for input(s): CHOL, HDL, LDLCALC, TRIG, CHOLHDL, LDLDIRECT in the last 72 hours. Thyroid function studies No results for input(s): TSH, T4TOTAL, T3FREE, THYROIDAB in the last 72 hours.  Invalid input(s): FREET3 Anemia work up No results for input(s): VITAMINB12, FOLATE, FERRITIN, TIBC, IRON, RETICCTPCT in the last 72 hours. Urinalysis    Component Value Date/Time   COLORURINE YELLOW 06/24/2018 0925   APPEARANCEUR CLEAR 06/24/2018 0925   LABSPEC 1.024 06/24/2018 0925   PHURINE 6.0 06/24/2018 0925   GLUCOSEU NEGATIVE 06/24/2018 0925   HGBUR NEGATIVE 06/24/2018 0925   BILIRUBINUR NEGATIVE 06/24/2018 0925   KETONESUR NEGATIVE 06/24/2018 0925   PROTEINUR 30 (A) 06/24/2018 0925   UROBILINOGEN 1.0 05/22/2009 0751   NITRITE NEGATIVE 06/24/2018 0925   LEUKOCYTESUR NEGATIVE 06/24/2018 0925   Sepsis Labs Invalid input(s): PROCALCITONIN,  WBC,  LACTICIDVEN Microbiology Recent Results (from the past 240 hour(s))  Blood Culture (routine x 2)     Status: None (Preliminary result)   Collection Time: 06/24/18  9:58 AM  Result Value Ref Range Status   Specimen Description BLOOD RIGHT FOREARM  Final   Special Requests   Final    BOTTLES DRAWN AEROBIC AND ANAEROBIC Blood Culture results may not be optimal due to an excessive volume of blood received in culture bottles Performed at [facility], [address], [place], [place] [address]    Culture NO GROWTH < 12 HOURS  Final   Report Status PENDING  Incomplete  Blood Culture (routine x 2)     Status: None (Preliminary result)   Collection Time: 06/24/18  9:58 AM  Result Value Ref Range Status   Specimen Description BLOOD RIGHT ANTECUBITAL  Final   Special Requests   Final    BOTTLES DRAWN AEROBIC AND ANAEROBIC Blood Culture results may not be optimal due to an excessive volume of blood received in culture bottles Performed at [facility], [address] [place],  [place] [address]    Culture NO GROWTH < 12 HOURS  Final   Report Status PENDING  Incomplete  CSF culture     Status: None (Preliminary result)   Collection Time: 06/24/18  3:37 PM  Result Value Ref Range Status   Specimen Description BACK  Final   Special Requests   Final    NONE Performed at [facility], [address] [place], [place] [address]    Culture NO GROWTH < 24 HOURS  Final   Report Status PENDING  Incomplete  Gram stain     Status: None   Collection Time: 06/24/18  3:37 PM  Result Value Ref Range Status   Specimen Description LUMBAR  Final   Special Requests NONE  Final   Gram Stain   Final    NO WBC SEEN NO ORGANISMS SEEN Performed at [facility], [address] [place], [place] [address]    Report Status 06/24/2018 FINAL  Final    Please note: You were cared for by a hospitalist during your hospital stay. Once you are discharged, your primary care physician will handle any further medical issues. Please note that NO REFILLS for any discharge medications will be authorized once you are discharged, as it is imperative that you return to your primary care physician (or establish a relationship with a primary care physician if you do not have one) for your post hospital  discharge needs so that they can reassess your need for medications and monitor your lab values.    Time coordinating discharge: 40 minutes  SIGNED:   Burnadette Pop, MD  [facility] 06/25/2018, 11:25 AM Pager [phone]  If 7PM-7AM, please contact night-coverage [email or web address] Password [email or web address]

## 2018-06-25 NOTE — Evaluation (Signed)
Physical Therapy Evaluation Patient Details Name: Manuel [name] MRN: [id number] DOB: [birth year] Today's Date: 06/25/2018   History of Present Illness  85 yo male admitted with fever-unknown origin, neck pain, acute sinusitis. Hx of Alz dementia  Clinical Impression  On eval, pt was Min guard-Min Assist for mobility. He walked ~75 feet with a straight cane. He is unsteady and generally weaker than his baseline. Discussed d/c plan with niece-will recommend HHPT.     Follow Up Recommendations Home health PT    Equipment Recommendations  None recommended by PT    Recommendations for Other Services       Precautions / Restrictions Precautions Precautions: Fall Restrictions Weight Bearing Restrictions: No      Mobility  Bed Mobility Overal bed mobility: Needs Assistance Bed Mobility: Supine to Sit     Supine to sit: Supervision     General bed mobility comments: for safety, lines.   Transfers Overall transfer level: Needs assistance Equipment used: None;Straight cane Transfers: Sit to/from Stand Sit to Stand: Min guard         General transfer comment: close guard for safety. VCs safety. increased time. unsteady.  Ambulation/Gait Ambulation/Gait assistance: Min assist Gait Distance (Feet): 75 Feet Assistive device: Straight cane Gait Pattern/deviations: Step-through pattern;Trunk flexed     General Gait Details: very flexed trunk. cues for safety, pacing. unsteady. intermittent assist to steady.  Stairs            Wheelchair Mobility    Modified Rankin (Stroke Patients Only)       Balance Overall balance assessment: Needs assistance           Standing balance-Leahy Scale: Fair                               Pertinent Vitals/Pain Pain Assessment: Faces Faces Pain Scale: Hurts a little bit Pain Location: neck/back of head Pain Descriptors / Indicators: Discomfort;Sore Pain Intervention(s): Monitored during  session;Repositioned    Home Living Family/patient expects to be discharged to:: Private residence Living Arrangements: Other relatives(sister, niece)   Type of Home: House Home Access: Stairs to enter Entrance Stairs-Rails: Right Entrance Stairs-Number of Steps: 5 Home Layout: One level Home Equipment: Cane - single point      Prior Function     Gait / Transfers Assistance Needed: cane for ambulation  ADL's / Homemaking Assistance Needed: bathes, dresses himself. family helps with meals.   Comments: senior day 9-12. gets on scat bus himself     Hand Dominance        Extremity/Trunk Assessment   Upper Extremity Assessment Upper Extremity Assessment: Generalized weakness    Lower Extremity Assessment Lower Extremity Assessment: Generalized weakness    Cervical / Trunk Assessment Cervical / Trunk Assessment: Kyphotic  Communication   Communication: No difficulties  Cognition Arousal/Alertness: Awake/alert Behavior During Therapy: WFL for tasks assessed/performed Overall Cognitive Status: History of cognitive impairments - at baseline                                        General Comments      Exercises     Assessment/Plan    PT Assessment Patient needs continued PT services  PT Problem List Decreased strength;Decreased balance;Decreased cognition;Decreased mobility;Decreased safety awareness;Decreased activity tolerance       PT Treatment Interventions DME instruction;Gait training;Functional mobility training;Therapeutic activities;Balance  training;Patient/family education;Therapeutic exercise    PT Goals (Current goals can be found in the Care Plan section)  Acute Rehab PT Goals Patient Stated Goal: home PT Goal Formulation: With patient/family Time For Goal Achievement: 07/09/18 Potential to Achieve Goals: Good    Frequency Min 3X/week   Barriers to discharge        Co-evaluation               AM-PAC PT "6 Clicks"  Mobility  Outcome Measure Help needed turning from your back to your side while in a flat bed without using bedrails?: A Little Help needed moving from lying on your back to sitting on the side of a flat bed without using bedrails?: A Little Help needed moving to and from a bed to a chair (including a wheelchair)?: A Little Help needed standing up from a chair using your arms (e.g., wheelchair or bedside chair)?: A Little Help needed to walk in hospital room?: A Little Help needed climbing 3-5 steps with a railing? : A Little 6 Click Score: 18    End of Session Equipment Utilized During Treatment: Gait belt Activity Tolerance: Patient tolerated treatment well Patient left: in chair;with call bell/phone within reach;with family/visitor present   PT Visit Diagnosis: Unsteadiness on feet (R26.81);Muscle weakness (generalized) (M62.81)    Time: 8850-2774 PT Time Calculation (min) (ACUTE ONLY): 18 min   Charges:   PT Evaluation $PT Eval Moderate Complexity: 1 Mod            Rebeca Alert, PT Acute Rehabilitation Services Pager: [phone] Office: [phone]

## 2018-06-25 NOTE — Care Management Note (Signed)
Case Management Note  Patient Details  Name: Manuel [name] MRN: [id number] Date of Birth: November 14, 1932  Subjective/Objective:                  discharged  Action/Plan: [facility] [place]  No other needs present.  Expected Discharge Date:  06/25/18               Expected Discharge Plan:  Home w Home Health Services  In-House Referral:     Discharge planning Services  CM Consult  Post Acute Care Choice:    Choice offered to:  Patient  DME Arranged:    DME Agency:     HH Arranged:  PT HH Agency:  [facility]  Status of Service:  Completed, signed off  If discussed at Long Length of Stay Meetings, dates discussed:    Additional Comments:  Golda Acre, RN 06/25/2018, 12:00 PM

## 2018-06-26 ENCOUNTER — Ambulatory Visit: Payer: Self-pay

## 2018-06-26 ENCOUNTER — Telehealth: Payer: Self-pay

## 2018-06-26 DIAGNOSIS — R413 Other amnesia: Secondary | ICD-10-CM

## 2018-06-26 DIAGNOSIS — I10 Essential (primary) hypertension: Secondary | ICD-10-CM

## 2018-06-26 NOTE — Patient Instructions (Addendum)
Visit Information  Face to Face appointment with CCM team member scheduled for: 07/04/18 @ 11:30 AM  Delsa Sale, RN,CCM Care Management Coordinator [facility]  Direct Phone: [phone]

## 2018-06-26 NOTE — Telephone Encounter (Signed)
Transition Care Management Follow-up Telephone Call  Date of discharge and from where: 06/24/2018 and [facility]  How have you been since you were released from the hospital? Not doing good his caregiver said that he has been unruly.  Any questions or concerns? Yes the caregiver wants to know if he has second stage dementia.  Items Reviewed:  Did the pt receive and understand the discharge instructions provided? Yes   He needs a BMP lab test at follow-up  Medications obtained and verified?   Any new allergies since your discharge? No  Dietary orders reviewed? No  Do you have support at home? Yes He has a Lobbyist  Other (ie: DME, Home Health, etc)   Functional Questionnaire: (I = Independent and D = Dependent) ADL's: D  Bathing/Dressing- I   Meal Prep- D  Eating- D  Maintaining continence- D  Transferring/Ambulation- I  Managing Meds- D   Follow up appointments reviewed:    PCP Hospital f/u appt confirmed? Yes Scheduled to see Dr. Allyne Gee on 03/06 @ 3:30  Specialist Hospital f/u appt confirmed?  N/A  Are transportation arrangements needed? NO  If their condition worsens, is the pt aware to call  their PCP or go to the ED?Yes  Was the patient provided with contact information for the PCP's office or ED? Yes  Was the pt encouraged to call back with questions or concerns? Yes

## 2018-06-26 NOTE — Chronic Care Management (AMB) (Signed)
  Chronic Care Management   Follow Up Note   06/26/2018 Name: Manuel [name] MRN: [id number] DOB: [birth year]  Referred by: Dorothyann Peng, MD Reason for referral : Chronic Care Management (#3 attempt to schedule initial CCM Face to Face visit)  Manuel [name] is a 85 y.o. year old male who is a primary care patient of Dorothyann Peng, MD. The CCM team was consulted for assistance with chronic disease management and care coordination needs.    Review of patient status, including review of consultants reports, relevant laboratory and other test results, and collaboration with appropriate care team members and the patient's provider was performed as part of comprehensive patient evaluation and provision of chronic care management services.    I reached out to Manuel [name] sister Manuel [name] listed as an approved person to speak with. Manuel [name] scheduled an initial CCM Face to Face visit for next week on Wednesday, 07/04/18 @11 :30 AM.    Manuel Sale, RN,CCM Care Management Coordinator [facility]  Direct Phone: [phone]

## 2018-06-27 ENCOUNTER — Telehealth: Payer: Self-pay

## 2018-06-28 LAB — CSF CULTURE W GRAM STAIN: Culture: NO GROWTH

## 2018-06-28 LAB — CSF CULTURE

## 2018-06-29 ENCOUNTER — Encounter: Payer: Self-pay | Admitting: Internal Medicine

## 2018-06-29 ENCOUNTER — Ambulatory Visit (INDEPENDENT_AMBULATORY_CARE_PROVIDER_SITE_OTHER): Payer: PPO | Admitting: Internal Medicine

## 2018-06-29 VITALS — BP 114/66 | HR 96 | Temp 98.5°F | Ht 64.0 in | Wt 185.0 lb

## 2018-06-29 DIAGNOSIS — R269 Unspecified abnormalities of gait and mobility: Secondary | ICD-10-CM

## 2018-06-29 DIAGNOSIS — Z09 Encounter for follow-up examination after completed treatment for conditions other than malignant neoplasm: Secondary | ICD-10-CM | POA: Diagnosis not present

## 2018-06-29 DIAGNOSIS — E6609 Other obesity due to excess calories: Secondary | ICD-10-CM | POA: Diagnosis not present

## 2018-06-29 DIAGNOSIS — Z6831 Body mass index (BMI) 31.0-31.9, adult: Secondary | ICD-10-CM | POA: Diagnosis not present

## 2018-06-29 DIAGNOSIS — Z87898 Personal history of other specified conditions: Secondary | ICD-10-CM

## 2018-06-29 DIAGNOSIS — J329 Chronic sinusitis, unspecified: Secondary | ICD-10-CM | POA: Diagnosis not present

## 2018-06-29 DIAGNOSIS — M542 Cervicalgia: Secondary | ICD-10-CM

## 2018-06-29 DIAGNOSIS — F0391 Unspecified dementia with behavioral disturbance: Secondary | ICD-10-CM | POA: Diagnosis not present

## 2018-06-29 DIAGNOSIS — E876 Hypokalemia: Secondary | ICD-10-CM | POA: Diagnosis not present

## 2018-06-29 LAB — CULTURE, BLOOD (ROUTINE X 2)
CULTURE: NO GROWTH
Culture: NO GROWTH

## 2018-06-29 LAB — HSV DNA BY PCR (REFERENCE LAB)
HSV 1 DNA: NEGATIVE
HSV 2 DNA: NEGATIVE

## 2018-06-30 LAB — BMP8+EGFR
BUN/Creatinine Ratio: 14 (ref 10–24)
BUN: 20 mg/dL (ref 8–27)
CO2: 24 mmol/L (ref 20–29)
CREATININE: 1.41 mg/dL — AB (ref 0.76–1.27)
Calcium: 9 mg/dL (ref 8.6–10.2)
Chloride: 92 mmol/L — ABNORMAL LOW (ref 96–106)
GFR calc Af Amer: 53 mL/min/{1.73_m2} — ABNORMAL LOW (ref >59)
GFR calc non Af Amer: 46 mL/min/{1.73_m2} — ABNORMAL LOW (ref >59)
Glucose: 99 mg/dL (ref 65–99)
Potassium: 3.8 mmol/L (ref 3.5–5.2)
Sodium: 135 mmol/L (ref 134–144)

## 2018-07-02 ENCOUNTER — Other Ambulatory Visit: Payer: Self-pay | Admitting: Internal Medicine

## 2018-07-04 ENCOUNTER — Telehealth: Payer: Self-pay

## 2018-07-04 ENCOUNTER — Ambulatory Visit: Payer: Self-pay

## 2018-07-04 ENCOUNTER — Other Ambulatory Visit: Payer: Self-pay

## 2018-07-04 ENCOUNTER — Ambulatory Visit (INDEPENDENT_AMBULATORY_CARE_PROVIDER_SITE_OTHER): Payer: PPO

## 2018-07-04 DIAGNOSIS — I10 Essential (primary) hypertension: Secondary | ICD-10-CM

## 2018-07-04 DIAGNOSIS — G4700 Insomnia, unspecified: Secondary | ICD-10-CM

## 2018-07-04 DIAGNOSIS — R413 Other amnesia: Secondary | ICD-10-CM

## 2018-07-04 NOTE — Telephone Encounter (Signed)
Left a message for the pt to call back for lab results. 

## 2018-07-04 NOTE — Chronic Care Management (AMB) (Addendum)
Chronic Care Management   Initial Visit Note  07/04/2018 Name: Manuel [name] MRN: [id number] DOB: 09-14-1932  Referred by: [name], MD Reason for referral : Chronic Care Management (INIITIAL CCM FACE TO FACE VISIT)   Subjective: "I would like to get my therapy"  Objective: Lab Results  Component Value Date   HGBA1C 5.1 06/06/2018   Lab Results  Component Value Date   LDLCALC 80 06/06/2018   CREATININE 1.41 (H) 06/29/2018   BP Readings from Last 3 Encounters:  06/29/18 114/66  06/25/18 (!) 134/58  06/06/18 130/78   Manuel [name] is a 85 y.o. year old male who is a primary care patient of [name], MD. The CCM team was consulted for assistance with chronic disease management and care coordination needs.   Review of patient status, including review of consultants reports, relevant laboratory and other test results, and collaboration with appropriate care team members and the patient's provider was performed as part of comprehensive patient evaluation and provision of chronic care management services.    The CCM team met with Mr. Manuel [name] today for his initial Face to Face visit. He was accompanied by his sister Manuel [name], and nieces Manuel [name] and Manuel [name].  Visit Information  Goals Addressed    Patient Stated    "I was in the hospital for Pneumonia; I feel better now" (pt-stated)       Current Barriers:   High Risk for infection  Nurse Case Manager Clinical Goal(s):   Over the next 30 days, patient will have no ED visits and or IP admissions secondary to infection.   Interventions:   Face to Face visit completed with patient, sister Manuel [name] and nieces Manuel [name] and Manuel [name]  Assessed for fever, cough, congestion, shortness of breath and or chest pain  Assessed for fatigue, appetite and hydration  Instructed patient on the importance of drinking plenty of water to stay well hydrated and to let his family knows if he feels ill  Provided verbal education related to s/s  of infection including COVID-19 and when to seek medical attention  Provided verbal education related to tips for prevention control including, avoiding in large crowds, avoid being in someone's line of spray when coughing/sneezing, use good handwashing to include using antibacterial soap and friction for 20 seconds or longer, in addition, use an alcohol based hand sanitizer and restrict traveling both in the [place] and out; keep up to date with the latest CDC recommendations as the pandemic progresses  Provided RNCM contact #; provided [facility] 24/7 nurse advice line phone #  Scheduled a telephone CCM follow up with patient/caregiver   Patient Self Care Activities:   Verbalizes understanding of the education/information provided today   Attends all scheduled provider appointments with assistance from family  Attends church or other social activities with assistance of family and SCAT   Participates in performing ADL's   Participates in performing IADL's  Patient is currently UNABLE to self administer his medications (niece Manuel [name] is assisting)  Plan:   RNCM will follow up with patient/caregiver again in 1-2 weeks by telephone   Initial goal documentation     "I would like to get my therapy" (pt-stated)       Current Barriers:   Impaired memory loss related to Dementia  Deconditioning related to recent hospitalization for Pneumonia  Nurse Case Manager Clinical Goal(s):   Over the next 30 days, patient will have received and or will be receiving in home PT/ST services per MD recommendations.  Over the next 30 days, patient/caregiver will report adherence to his established HEP, including cognitive/memory exercises.   Interventions:   Face to Face visit completed in the office today with patient, his sister Manuel [name] and 2 nieces, Manuel [name] and Manuel [name]  Evaluation of current treatment plan related to physical therpay and speech therapy and patient's adherence to plan as established by  provider  Assessed for falls and fall risk  Assessed patient's normal gait and proper use of DME  Assessed for home safety concerns  Explained potential benefits of this service to patient/family  Collaboration with Dr. Baird Cancer via in basket message requesting an order be placed for services  Provided RNCM contact #; provided [facility] 24/7 nurse advice line phone #  Scheduled a telephone CCM follow up with patient/caregiver  Patient Self Care Activities:   Verbalizes understanding of the education/information provided today   Attends all scheduled provider appointments with assistance from family  Attends church or other social activities with assistance of family and SCAT   Participates in performing ADL's   Participates in performing IADL's  Patient is currently UNABLE to self administer his medications (niece Manuel [name] is assisting)  Plan:   RNCM will follow up with patient/caregivers over the next 1-2 weeks   RNCM will follow up with PCP, Dr. [name] regarding order for in-home PT/ST   Initial goal documentation       Niece Manuel [name] Stated    "He is waking up at 3 AM every night"       Current Barriers:   Dementia  Impaired Sleep Pattern  Nurse Case Manager Clinical Goal(s):  Over the next 30 days, patient will demonstrate having an improved sleep pattern as evidence by patient will awaken during normal daylight hours.  Interventions:   Face to Face visit completed in the office with patient, sister Manuel [name], and nieces, Manuel [name] and Manuel [name]  Assessed for current sleep pattern  Assessed for potential causes for impaired sleep  Assessed for current pharmacological interventions prescribed  Collaboration with Dr. Baird Cancer via in basket message re: recommendation for increasing Melatonin dosage and or other choices for pharmacological intervention for impaired sleep  Provided RNCM contact#; provided [facility] 24/7 nurse advice line phone #  Scheduled CCM telephone follow up  with patient/caregiver  Patient Self Care Activities:   Verbalizes understanding of today's education/information provided  Attends all scheduled provider appointments with assistance from family  Attends church or other social activities with assistance of family and SCAT   Participates in performing ADL's   Participates in performing IADL's  Patient is currently UNABLE to self administer his medications (niece Manuel [name] is assisting)  Plan:   RNCM will follow up with patient/caregiver in 1-2 weeks   RNCM will address Melatonin dosing with PCP and notify patient/caregiver of recommendations   Initial goal documentation     "He needs several medication refills"       Current Barriers:   Unable to self administer medications due to Dementia  Nurse Case Manager Clinical Goal(s):   Over the next 30 days patient will have all medications refilled; niece will report patient is taking his medications exactly as prescribed without missed doses.   Interventions:   Face to Face visit completed with patient, sister Manuel [name], and nieces Manuel [name] and Manuel [name]  Completed medication reconciliation with niece Manuel [name]  Assessed for questions, concerns or financial hardship paying for meds  Collaboration with Dr. Baird Cancer via in basket message requesting refills for Flexeril and Tramadol  Provided RNCM contact #; provided  [facility] 24/7 nurse advice line phone #  Scheduled a telephone CCM follow up with patient/caregiver  Patient Self Care Activities:   Verbalizes understanding of the education/information provided today  Attends all scheduled provider appointments with assistance from family  Attends church or other social activities with assistance of family and SCAT   Participates in performing ADL's   Participates in performing IADL's  Patient is currently UNABLE to self administer his medications (niece Manuel [name] is assisting)  Plan:   RNCM will send in basket message to Dr. Baird Cancer regarding  medication refills needed   RNCM will follow up with patient/caregiver by telephone over the next 1-2 weeks   Initial goal documentation     "His BP can be high"       Current Barriers:   Knowledge Deficit related to Hypertension  Nurse Case Manager Clinical Goal(s):   Over the next 30 days, patient's niece will have increased knowledge related to patient's target BP per PCP goal.  Over the next 30 days, patient's niece will monitor patient's BP at home and call the CCM team and or PCP to report any/all abnormal readings  Interventions:   Face to Face CCM visit completed today in office with patient, sister Manuel [name] and sisters, Manuel [name] and Manuel [name]  Assessed for niece's ability to monitor patient's BP at home  Provided [facility] blue calendar notebook to record BP readings  Instructed patient/niece to spot check BP at least 3-4 times weekly and record in notebook  Provided RNCM contact #; provided [facility] 24/7 nurse advice line phone #  Telephone CCM follow up scheduled with patient/caregiver  Patient Self Care Activities:   Verbalizes understanding of today's education/information provided  Attends all scheduled provider appointments with assistance from family  Attends church or other social activities with assistance of family and SCAT   Participates in performing ADL's   Participates in performing IADL's  Patient is currently UNABLE to self administer his medications (niece Manuel [name] is assisting)  Plan:   RNCM will follow up with patient/niece in 1-2 weeks   RNCM will provide education related to PCP recommendations for target BP at next follow-up  RNCM will provide further education to patient/caregiver related to complications of uncontrolled HTN   Initial goal documentation     We would like a Neurology referral"       Current Barriers:   Knowledge Deficit related Dementia   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   Interventions:   Face to Face visit completed today with patient, sister Manuel [name], and nieces Manuel [name] and Manuel [name]  Assessed for knowledge and understanding of Dementia disease process and what to expect   Assessed for current treatment plan related to Dementia  Assessed for adequate support system and potential for caregiver burnout  Collaboration with PCP Dr. [name], MD re: Neurology referral per family request to further evaluate Dementia  Education provided related to the importance of using validation and re-directing to best manage and help avoid anxiety  Education given related to the best approach is to keep patient on a structured schedule daily  Assessed for patient's ability to perform social and physical activities to help maintain/improve patient's physical and cognitive ability's  Provided RNCM contact #; provided [facility] 24/7 nurse advice line phone #  Scheduled a CCM telephone follow up with patient/caregiver  Patient Self Care Activities:  Verbalizes understanding of today's education/information provided  Attends all scheduled provider appointments with assistance from family  Attends church or other social activities with assistance of family and SCAT   Participates in performing ADL's   Participates in performing IADL's  Patient is currently UNABLE to self administer his medications (niece Manuel [name] is assisting)  Plan:   RNCM will collaborate with PCP re; referral for Neurology to evaluate Dementia progression   RNCM will follow up with patient over the next 1-2 weeks   Initial goal documentation     The CM team will reach out to the patient again over the next 7-14 days.   Barb Merino, RN,CCM Care Management Coordinator [facility]  Direct Phone: [phone]

## 2018-07-04 NOTE — Telephone Encounter (Signed)
-----   Message from Dorothyann Peng, MD sent at 06/30/2018  4:08 PM EST ----- Your kidney fxn is sl. Decreased. Be sure to stay well hydrated. Potassium level is nl.

## 2018-07-04 NOTE — Patient Instructions (Addendum)
Visit Information  Goals Addressed    Patient Stated   . "I was in the hospital for Pneumonia; I feel better now" (pt-stated)       Current Barriers:  . High Risk for infection  Nurse Case Manager Clinical Goal(s):  [name] Over the next 30 days, patient will have no ED visits and or IP admissions secondary to infection.   Interventions:  . Face to Face visit completed with patient, sister [name] and nieces Sheralyn Boatman and Almira Coaster . Assessed for fever, cough, congestion, shortness of breath and or chest pain . Assessed for fatigue, appetite and hydration . Instructed patient on the importance of drinking plenty of water to stay well hydrated and to let his family knows if he feels ill . Provided verbal education related to s/s of infection including COVID-19 and when to seek medical attention . Provided verbal education related to tips for prevention control including, avoiding in large crowds, avoid being in someone's line of spray when coughing/sneezing, use good handwashing to include using antibacterial soap and friction for 20 seconds or longer, in addition, use an alcohol based hand sanitizer and restrict traveling both in the [place] and out; keep up to date with the latest CDC recommendations as the pandemic progresses . Provided RNCM contact #; provided [facility] 24/7 nurse advice line phone # . Scheduled a telephone CCM follow up with patient/caregiver   Patient Self Care Activities:   Verbalizes understanding of the education/information provided today  . Attends all scheduled provider appointments with assistance from family . Attends church or other social activities with assistance of family and SCAT  . Participates in performing ADL's  . Participates in performing IADL's . Patient is currently UNABLE to self administer his medications (niece Sheralyn Boatman is assisting)  Plan:  . RNCM will follow up with patient/caregiver again in 1-2 weeks by telephone   Initial goal documentation    . "I would like  to get my therapy" (pt-stated)       Current Barriers:  . Impaired memory loss related to Dementia . Deconditioning related to recent hospitalization for Pneumonia  Nurse Case Manager Clinical Goal(s):  [name] Over the next 30 days, patient will have received and or will be receiving in home PT/ST services per MD recommendations.  . Over the next 30 days, patient/caregiver will report adherence to his established HEP, including cognitive/memory exercises.   Interventions:   Face to Face visit completed in the office today with patient, his sister [name] and 2 nieces, Sheralyn Boatman and Almira Coaster . Evaluation of current treatment plan related to physical therpay and speech therapy and patient's adherence to plan as established by provider . Assessed for falls and fall risk . Assessed patient's normal gait and proper use of DME . Assessed for home safety concerns . Explained potential benefits of this service to patient/family . Collaboration with Dr. Allyne Gee via in basket message requesting an order be placed for services . Provided RNCM contact #; provided [facility] 24/7 nurse advice line phone # . Scheduled a telephone CCM follow up with patient/caregiver  Patient Self Care Activities:   Verbalizes understanding of the education/information provided today  . Attends all scheduled provider appointments with assistance from family . Attends church or other social activities with assistance of family and SCAT  . Participates in performing ADL's  . Participates in performing IADL's . Patient is currently UNABLE to self administer his medications (niece Sheralyn Boatman is assisting)  Plan:  . RNCM will follow up with patient/caregivers over the  next 1-2 weeks  . RNCM will follow up with PCP, Dr. Dorothyann Peng regarding order for in-home PT/ST   Initial goal documentation      Niece Regino Schultze   . "He is waking up at 3 AM every night"       Current Barriers:  . Dementia . Impaired Sleep Pattern  Nurse Case Manager  Clinical Goal(s):  Over the next 30 days, patient will demonstrate having an improved sleep pattern as evidence by patient will awaken during normal daylight hours.  Interventions:   Face to Face visit completed in the office with patient, sister [name], and nieces, Sheralyn Boatman and Almira Coaster . Assessed for current sleep pattern . Assessed for potential causes for impaired sleep . Assessed for current pharmacological interventions prescribed . Collaboration with Dr. Allyne Gee via in basket message re: recommendation for increasing Melatonin dosage and or other choices for pharmacological intervention for impaired sleep . Provided RNCM contact#; provided [facility] 24/7 nurse advice line phone # . Scheduled CCM telephone follow up with patient/caregiver  Patient Self Care Activities:  Trenton Gammon understanding of today's education/information provided . Attends all scheduled provider appointments with assistance from family . Attends church or other social activities with assistance of family and SCAT  . Participates in performing ADL's  . Participates in performing IADL's . Patient is currently UNABLE to self administer his medications (niece Sheralyn Boatman is assisting)  Plan:  . RNCM will follow up with patient/caregiver in 1-2 weeks  . RNCM will address Melatonin dosing with PCP and notify patient/caregiver of recommendations   Initial goal documentation    . "He needs several medication refills"       Current Barriers:  . Unable to self administer medications due to Dementia  Nurse Case Manager Clinical Goal(s):   Over the next 30 days patient will have all medications refilled; niece will report patient is taking his medications exactly as prescribed without missed doses.   Interventions:  . Face to Face visit completed with patient, sister [name], and nieces Sheralyn Boatman and Almira Coaster . Completed medication reconciliation with niece Sheralyn Boatman . Assessed for questions, concerns or financial hardship paying for  meds . Collaboration with Dr. Allyne Gee via in basket message requesting refills for Flexeril and Tramadol . Provided RNCM contact #; provided [facility] 24/7 nurse advice line phone # . Scheduled a telephone CCM follow up with patient/caregiver  Patient Self Care Activities:   Verbalizes understanding of the education/information provided today . Attends all scheduled provider appointments with assistance from family . Attends church or other social activities with assistance of family and SCAT  . Participates in performing ADL's  . Participates in performing IADL's . Patient is currently UNABLE to self administer his medications (niece Sheralyn Boatman is assisting)  Plan:  . RNCM will send in basket message to Dr. Allyne Gee regarding medication refills needed  . RNCM will follow up with patient/caregiver by telephone over the next 1-2 weeks   Initial goal documentation    . "His BP can be high"       Current Barriers:  [name] Knowledge Deficit related to Hypertension  Nurse Case Manager Clinical Goal(s):   Over the next 30 days, patient's niece will have increased knowledge related to patient's target BP per PCP goal. . Over the next 30 days, patient's niece will monitor patient's BP at home and call the CCM team and or PCP to report any/all abnormal readings  Interventions:  . Face to Face CCM visit completed today in office with patient, sister  [name] and sisters, Sheralyn Boatman and Almira Coaster . Assessed for niece's ability to monitor patient's BP at home . Provided [facility] blue calendar notebook to record BP readings . Instructed patient/niece to spot check BP at least 3-4 times weekly and record in notebook . Provided RNCM contact #; provided [facility] 24/7 nurse advice line phone # . Telephone CCM follow up scheduled with patient/caregiver  Patient Self Care Activities:  Trenton Gammon understanding of today's education/information provided . Attends all scheduled provider appointments with assistance from family . Attends  church or other social activities with assistance of family and SCAT  . Participates in performing ADL's  . Participates in performing IADL's . Patient is currently UNABLE to self administer his medications (niece Sheralyn Boatman is assisting)  Plan:  . RNCM will follow up with patient/niece in 1-2 weeks  . RNCM will provide education related to PCP recommendations for target BP at next follow-up  RNCM will provide further education to patient/caregiver related to complications of uncontrolled HTN   Initial goal documentation    . We would like a Neurology referral"       Current Barriers:  [name] Knowledge Deficit related Dementia  . 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   Interventions:  . Face to Face visit completed today with patient, sister [name], and nieces Sheralyn Boatman and Almira Coaster . Assessed for knowledge and understanding of Dementia disease process and what to expect  . Assessed for current treatment plan related to Dementia . Assessed for adequate support system and potential for caregiver burnout . Collaboration with PCP Dr. Dorothyann Peng, MD re: Neurology referral per family request to further evaluate Dementia . Education provided related to the importance of using validation and re-directing to best manage and help avoid anxiety . Education given related to the best approach is to keep patient on a structured schedule daily . Assessed for patient's ability to perform social and physical activities to help maintain/improve patient's physical and cognitive ability's . Provided RNCM contact #; provided [facility] 24/7 nurse advice line phone # . Scheduled a CCM telephone follow up with patient/caregiver  Patient Self Care Activities:  Trenton Gammon understanding of today's education/information provided . Attends all scheduled provider  appointments with assistance from family . Attends church or other social activities with assistance of family and SCAT  . Participates in performing ADL's  . Participates in performing IADL's . Patient is currently UNABLE to self administer his medications (niece Sheralyn Boatman is assisting)  Plan:  . RNCM will collaborate with PCP re; referral for Neurology to evaluate Dementia progression  . RNCM will follow up with patient over the next 1-2 weeks   Initial goal documentation     Education or Materials Provided:  . Yes: [facility] blue calendar notebook, 24/7 Nurse advice line [place]  The patient verbalized understanding of instructions provided today and declined a print copy of patient instruction materials.   The CM team will reach out to the patient again over the next 7-14 days.   Delsa Sale, RN,CCM Care Management Coordinator [facility] Care Management/[facility]  Direct Phone: [phone]

## 2018-07-05 NOTE — Chronic Care Management (AMB) (Signed)
Chronic Care Management    Clinical Social Work General Note  07/05/2018 Name: Manuel [name] MRN: [id number] DOB: June 20, 1932  Manuel [name] is a 85 y.o. year old male who is a primary care patient of [name], MD. The CCM was consulted to assist the patient with [employer] and [employer] representative.   Mr. Deerman was given information about Chronic Care Management services today including:  1. CCM service includes personalized support from designated clinical staff supervised by his physician, including individualized plan of care and coordination with other care providers 2. 24/7 contact phone numbers for assistance for urgent and routine care needs. 3. Service will only be billed when office clinical staff spend 20 minutes or more in a month to coordinate care. 4. Only one practitioner may furnish and bill the service in a calendar month. 5. The patient may stop CCM services at any time (effective at the end of the month) by phone call to the office staff. 6. The patient will be responsible for cost sharing (co-pay) of up to 20% of the service fee (after annual deductible is met).  Patient agreed to services and verbal consent obtained.   Review of patient status, including review of consultants reports, relevant laboratory and other test results, and collaboration with appropriate care team members and the patient's provider was performed as part of comprehensive patient evaluation and provision of chronic care management services.    SW met face to face with the patient as well as his sister and two nieces who assist with care as needed.  SDOH (Social Determinants of Health) screening performed today. The patient resides with his sister who reports concerns with utility costs due to an old furnace. SW currently working with the patients sister as a Advertising account executive and has made a home modification goal as part of her care plan due to the home being in her name.  SW will work with the patient surrounding advance directive completion and dementia education.   Goals Addressed            This Visit's Progress     Patient Stated    "I need to name a healthcare power of attorney" (pt-stated)       Current Barriers:   Limited education about the difference between a healthcare power of attorney and financial power of attorney  Clinical Social Work Clinical Goal(s):   Over the next 10 days, the patient will complete advance directive packet.  Over the next 30 days, the patient will have advance directives notarized.  Over the next 45 days, the patient will provide a copy of notarized advance directives to his primary care providers office.  Interventions:  Patient interviewed and appropriate assessments performed  Discussed plans with patient for ongoing care management follow up and provided patient with direct contact information for care management team  Advised patient to complete advance directives and to provide a notarized copy to his named health care agent and his primary care provider.  Provided education and assistance to client regarding Advanced Directives.  Patient Self Care Activities:   The patient is able to perform ADL's independently  The patient is unable to manage medications on his own and relies on his niece for assistance  The patient is able to discuss healthcare needs and decisions with his sister   Plan:   Patient will review advance directive packet at home with his family prior to completing   Initial goal documentation     COMPLETED: "  we want to know more about dementia" (pt-stated)   On track    Current Barriers:   Limited education about types of dementia and progression of the disease  Clinical Social Work Clinical Goal(s):   Over the next 10 days, client will work with SW to address concerns related to knowledge and understanding of dementia  Interventions:  Patient interviewed and  appropriate assessments performed  Provided patient with information about types of dementia  SW educated the patient and his family on the progression of dementia and gave examples of what decline may look like for the patient  SW educated the patient and his family on the importance of staying active and keeping his current routine  SW provided educational materials to the family on caring for a loved one with dementia  Collaborated with RN Case Manager re: patient interest in Neurology referral due to family concerns of reported anxiety.agitation of the patient and interest in learning more about medications which may benefit the patient  Discussed plans with patient for ongoing care management follow up and provided patient with direct contact information for care management team  Patient Self Care Activities:   Attends all scheduled provider appointments  Attends church or other social activities  Performs ADL's independently   Performs iADL's with verbal prompts from family members  Plan:  [place] will discuss patient desire to be referred to neurologist with the patients primary care provider (see goal to see a neurologist for further updates)  The patient will contact SW if more information on dementia is desired and/or needed as patient declines  Initial goal documentation        Follow Up Plan: SW will follow up with patient by phone over the next month to follow up on completion of advance directive.        [name], BSW, CDP [facility] Care Management Social Worker [phone]  Total time spent performing care coordination and/or care management activities with the patient by phone or face to face = 60 minutes.

## 2018-07-05 NOTE — Patient Instructions (Signed)
Social Worker Visit Information  Goals we discussed today:  Goals Addressed            This Visit's Progress     Patient Stated   . "I need to name a healthcare power of attorney" (pt-stated)       Current Barriers:  . Limited education about the difference between a healthcare power of attorney and financial power of attorney  Clinical Social Work Clinical Goal(s):  [name] Over the next 10 days, the patient will complete advance directive packet. . Over the next 30 days, the patient will have advance directives notarized. . Over the next 45 days, the patient will provide a copy of notarized advance directives to his primary care providers office.  Interventions: . Patient interviewed and appropriate assessments performed . Discussed plans with patient for ongoing care management follow up and provided patient with direct contact information for care management team . Advised patient to complete advance directives and to provide a notarized copy to his named health care agent and his primary care provider. . Provided education and assistance to client regarding Advanced Directives.  Patient Self Care Activities:  . The patient is able to perform ADL's independently . The patient is unable to manage medications on his own and relies on his niece for assistance . The patient is able to discuss healthcare needs and decisions with his sister   Plan:  . Patient will review advance directive packet at home with his family prior to completing   Initial goal documentation     . COMPLETED: "we want to know more about dementia" (pt-stated)   On track    Current Barriers:  . Limited education about types of dementia and progression of the disease  Clinical Social Work Clinical Goal(s):  [name] Over the next 10 days, client will work with SW to address concerns related to knowledge and understanding of dementia  Interventions: . Patient interviewed and appropriate assessments  performed . Provided patient with information about types of dementia . SW educated the patient and his family on the progression of dementia and gave examples of what decline may look like for the patient . SW educated the patient and his family on the importance of staying active and keeping his current routine . SW provided educational materials to the family on caring for a loved one with dementia . Collaborated with RN Case Manager re: patient interest in Neurology referral due to family concerns of reported anxiety.agitation of the patient and interest in learning more about medications which may benefit the patient . Discussed plans with patient for ongoing care management follow up and provided patient with direct contact information for care management team  Patient Self Care Activities:  . Attends all scheduled provider appointments . Attends church or other social activities . Performs ADL's independently  . Performs iADL's with verbal prompts from family members  Plan:  CCM RNCM will discuss patient desire to be referred to neurologist with the patients primary care provider (see goal to see a neurologist for further updates)  The patient will contact SW if more information on dementia is desired and/or needed as patient declines  Initial goal documentation         Materials provided: Yes: Provided patient with an advance directive packet as well as educational materials on dementia  Mr. Bribiesca was given information about Chronic Care Management services today including:  1. CCM service includes personalized support from designated clinical staff supervised by his physician, including individualized  plan of care and coordination with other care providers 2. 24/7 contact phone numbers for assistance for urgent and routine care needs. 3. Service will only be billed when office clinical staff spend 20 minutes or more in a month to coordinate care. 4. Only one practitioner  may furnish and bill the service in a calendar month. 5. The patient may stop CCM services at any time (effective at the end of the month) by phone call to the office staff. 6. The patient will be responsible for cost sharing (co-pay) of up to 20% of the service fee (after annual deductible is met).  Patient agreed to services and verbal consent obtained.   The patient verbalized understanding of instructions provided today and declined a print copy of patient instruction materials.   Follow up plan: SW will follow up with patient by phone over the next month   Manuel [name], BSW, CDP [facility] Care Management Social Worker [phone]

## 2018-07-09 ENCOUNTER — Telehealth: Payer: Self-pay

## 2018-07-09 ENCOUNTER — Encounter: Payer: Self-pay | Admitting: Internal Medicine

## 2018-07-09 MED ORDER — CYCLOBENZAPRINE HCL 5 MG PO TABS: ORAL_TABLET | ORAL | 0 refills | Status: DC

## 2018-07-09 MED ORDER — TRAMADOL HCL 50 MG PO TABS: ORAL_TABLET | ORAL | 0 refills | Status: DC

## 2018-07-09 NOTE — Telephone Encounter (Signed)
The pt's niece wants to know if the pt is going to still have a neurology referral and if his medications were going to be changed because there was a discussion about changing the meds when they had a meeting with the [facility] case managers last week.  I left a message that I would ask Dr/ Allyne Gee when she returns to the office.

## 2018-07-09 NOTE — Progress Notes (Signed)
Subjective:     Patient ID: Manuel [name] , male    DOB: 1933/02/12 , 85 y.o.   MRN: [id number]   Chief Complaint  Patient presents with  . Hospitalization Follow-up    HPI  He is here today for hospital f/u. He is accompanied by his niece, [name].  He was admitted to [facility] on 3/1 for further evaluation of neck pain, cough, fever and shortness of breath. Due to the neck pain, he was initially felt to have meningitis. LP and CXR were normal.  He was discharged in stable condition on 3/2 on doxycycline due to PCN allergy. Lab workup significant for hypokalemia. It is suggested that we check his potassium today.   He denies having any further issues since his discharge.       Past Medical History:  Diagnosis Date  . Alzheimer's dementia ([place])   . Hypertension   . Prostate disorder      Family History  Problem Relation Age of Onset  . Early death Mother   . Prostate cancer Father      Current Outpatient Medications:  .  aspirin EC 81 MG tablet, Take 81 mg by mouth every evening. , Disp: , Rfl:  .  cyclobenzaprine (FLEXERIL) 5 MG tablet, One tablet po qhs prn, Disp: 30 tablet, Rfl: 0 .  losartan-hydrochlorothiazide (HYZAAR) 100-25 MG tablet, Take 1 tablet by mouth daily., Disp: 90 tablet, Rfl: 1 .  tamsulosin (FLOMAX) 0.4 MG CAPS capsule, Take 0.4 mg by mouth daily after supper., Disp: , Rfl:  .  traMADol (ULTRAM) 50 MG tablet, One nightly as needed for pain, Disp: 30 tablet, Rfl: 0 .  vitamin E 200 UNIT capsule, Take 200 Units by mouth every evening., Disp: , Rfl:  .  acetaminophen (TYLENOL) 500 MG tablet, Take 1,000 mg by mouth every 6 (six) hours as needed for moderate pain., Disp: , Rfl:  .  amLODipine (NORVASC) 5 MG tablet, TAKE 1 TABLET BY MOUTH EVERY DAY, Disp: 90 tablet, Rfl: 3 .  cholecalciferol (VITAMIN D3) 25 MCG (1000 UT) tablet, Take 2,000 Units by mouth every evening., Disp: , Rfl:  .  [employer] MELATONIN 3 MG TABS, TAKE 1 TABLET (3 MG TOTAL) BY MOUTH AT  BEDTIME., Disp: 30 tablet, Rfl: 0 .  hydrochlorothiazide (HYDRODIURIL) 25 MG tablet, TAKE 1 TABLET BY MOUTH EVERY DAY, Disp: 90 tablet, Rfl: 0   Allergies  Allergen Reactions  . Penicillins Swelling    Did it involve swelling of the face/tongue/throat, SOB, or low BP? Yes Did it involve sudden or severe rash/hives, skin peeling, or any reaction on the inside of your mouth or nose? No Did you need to seek medical attention at a hospital or doctor's office? No When did it last happen?2013 If all above answers are "NO", may proceed with cephalosporin use.      Review of Systems  Constitutional: Negative.   Respiratory: Negative.   Cardiovascular: Negative.   Gastrointestinal: Negative.   Neurological: Negative.   Psychiatric/Behavioral: Positive for agitation and sleep disturbance.       Niece reports he is agitated at night. Melatonin is not helping. This is a new symptom for him.      Today's Vitals   06/29/18 1524  BP: 114/66  Pulse: 96  Temp: 98.5 F (36.9 C)  TempSrc: Oral  Weight: 185 lb (83.9 kg)  Height: _0  (1.626 m)  PainSc: 0-No pain   Body mass index is 31.76 kg/m.   Objective:  Physical  Exam Vitals signs and nursing note reviewed.  Constitutional:      Appearance: Normal appearance.  Cardiovascular:     Rate and Rhythm: Normal rate and regular rhythm.     Heart sounds: Normal heart sounds.  Pulmonary:     Effort: Pulmonary effort is normal.     Breath sounds: Normal breath sounds.  Musculoskeletal:     Comments: Stooped posture, shuffling gait, ambulatory with cane  Skin:    General: Skin is warm.  Neurological:     General: No focal deficit present.     Mental Status: He is alert.  Psychiatric:        Mood and Affect: Mood normal.         Assessment And Plan:     1. Sinusitis, unspecified chronicity, unspecified location  RESOLVING. HE IS ENCOURAGED TO COMPLETE FULL ABX COURSE. TCM PERFORMED. A MEMBER OF THE CLINICAL TEAM SPOKE WITH  THE PATIENT UPON DISCHARGE. DISCHARGE SUMMARY WAS REVIEWED IN FULL DETAIL DURING THE VISIT. MEDS RECONCILED AND COMPARED TO DISCHARGE MEDS. MEDICATION LIST WAS UPDATED AND REVIEWED WITH THE PATIENT. GREATER THAN 50% FACE TO FACE TIME WAS SPENT IN COUNSELING AND COORDINATION OF CARE. ALL QUESTIONS WERE ANSWERED TO THE SATISFACTION OF THE PATIENT AND HIS  NIECE, TONI.     2. Neck pain  Resolved. He will be given refill of flexeril to use nightly prn.   3. Hypokalemia  I will recheck his potassium levels today. He is encouraged to follow diet high in potassium rich foods.   - BMP8+EGFR  4. Dementia with behavioral disturbance, unspecified dementia type (HCC)  Chronic. Family is now interested in Neurology evaluation. They have been offered meds in the past, but declined. I have also referred him to home health for speech therapy.   5. Gait abnormality  I will refer him for in-home physical therapy.   5. Class 1 obesity due to excess calories without serious comorbidity with body mass index (BMI) of 31.0 to 31.9 in adult  He is encouraged to initially strive for BMI less than 30 to decrease cardiac risk. He is advised to exercise no less than 150 minutes per week.  He is encouraged to start with chair exercises - he may start with performing exercises during commercials while watching tv.     [name], MD

## 2018-07-11 ENCOUNTER — Telehealth: Payer: Self-pay

## 2018-07-17 ENCOUNTER — Other Ambulatory Visit: Payer: Self-pay

## 2018-07-17 MED ORDER — LOSARTAN POTASSIUM-HCTZ 100-25 MG PO TABS: 1.0000 | ORAL_TABLET | Freq: Every day | ORAL | 1 refills | Status: DC

## 2018-07-19 ENCOUNTER — Telehealth: Payer: Self-pay

## 2018-07-23 DIAGNOSIS — I10 Essential (primary) hypertension: Secondary | ICD-10-CM | POA: Diagnosis not present

## 2018-07-23 DIAGNOSIS — F0391 Unspecified dementia with behavioral disturbance: Secondary | ICD-10-CM | POA: Diagnosis not present

## 2018-07-23 DIAGNOSIS — J189 Pneumonia, unspecified organism: Secondary | ICD-10-CM | POA: Diagnosis not present

## 2018-07-23 DIAGNOSIS — F0281 Dementia in other diseases classified elsewhere with behavioral disturbance: Secondary | ICD-10-CM | POA: Diagnosis not present

## 2018-07-23 DIAGNOSIS — E6609 Other obesity due to excess calories: Secondary | ICD-10-CM | POA: Diagnosis not present

## 2018-07-23 DIAGNOSIS — G309 Alzheimer's disease, unspecified: Secondary | ICD-10-CM | POA: Diagnosis not present

## 2018-07-23 DIAGNOSIS — Z87891 Personal history of nicotine dependence: Secondary | ICD-10-CM | POA: Diagnosis not present

## 2018-07-23 DIAGNOSIS — Z6830 Body mass index (BMI) 30.0-30.9, adult: Secondary | ICD-10-CM | POA: Diagnosis not present

## 2018-07-24 ENCOUNTER — Other Ambulatory Visit: Payer: Self-pay | Admitting: Internal Medicine

## 2018-07-24 NOTE — Progress Notes (Signed)
Pt requested refill of tramadol. Will  Not refill.

## 2018-07-25 ENCOUNTER — Telehealth: Payer: Self-pay

## 2018-07-25 DIAGNOSIS — F0281 Dementia in other diseases classified elsewhere with behavioral disturbance: Secondary | ICD-10-CM | POA: Diagnosis not present

## 2018-07-25 DIAGNOSIS — J189 Pneumonia, unspecified organism: Secondary | ICD-10-CM | POA: Diagnosis not present

## 2018-07-25 DIAGNOSIS — I10 Essential (primary) hypertension: Secondary | ICD-10-CM | POA: Diagnosis not present

## 2018-07-25 DIAGNOSIS — G309 Alzheimer's disease, unspecified: Secondary | ICD-10-CM | POA: Diagnosis not present

## 2018-07-25 DIAGNOSIS — Z6830 Body mass index (BMI) 30.0-30.9, adult: Secondary | ICD-10-CM | POA: Diagnosis not present

## 2018-07-25 DIAGNOSIS — Z87891 Personal history of nicotine dependence: Secondary | ICD-10-CM | POA: Diagnosis not present

## 2018-07-25 DIAGNOSIS — E6609 Other obesity due to excess calories: Secondary | ICD-10-CM | POA: Diagnosis not present

## 2018-07-25 DIAGNOSIS — F0391 Unspecified dementia with behavioral disturbance: Secondary | ICD-10-CM | POA: Diagnosis not present

## 2018-07-25 NOTE — Telephone Encounter (Signed)
Returned the pt's niece [name] and notified her that Dr, Allyne Gee said it's too early for a refill on the pt's tramadol and that it was last refilled on 07/09/2018.

## 2018-07-26 ENCOUNTER — Telehealth: Payer: Self-pay

## 2018-07-27 ENCOUNTER — Other Ambulatory Visit: Payer: Self-pay

## 2018-07-27 ENCOUNTER — Telehealth: Payer: Self-pay

## 2018-07-27 ENCOUNTER — Ambulatory Visit (INDEPENDENT_AMBULATORY_CARE_PROVIDER_SITE_OTHER): Payer: PPO

## 2018-07-27 DIAGNOSIS — G4700 Insomnia, unspecified: Secondary | ICD-10-CM

## 2018-07-27 DIAGNOSIS — R413 Other amnesia: Secondary | ICD-10-CM

## 2018-07-27 DIAGNOSIS — I10 Essential (primary) hypertension: Secondary | ICD-10-CM | POA: Diagnosis not present

## 2018-07-27 NOTE — Patient Instructions (Signed)
Visit Information  Goals Addressed      Patient Stated   . "I would like to get my therapy" (pt-stated)   On track    Current Barriers:  . Impaired memory loss related to Dementia . Deconditioning related to recent hospitalization for Pneumonia  Nurse Case Manager Clinical Goal(s):  [name] Over the next 30 days, patient will have received and or will be receiving in home PT/ST services per MD recommendations.  . Over the next 30 days, patient/caregiver will report adherence to his established HEP, including cognitive/memory exercises.   Interventions:  . Completed CCM telephone follow up with sister Manuel [name] . Assessed for start of in home services (sister confirms services, PT/ST have started through [facility]) . Assessed for adherence and benefits of in home services (sister states patient is getting good benefits from these services) . Assessed for patient adherence to participating in his HEP (pt is adhering but sister admits he is sleeping more now since being restricted to their home during COVID-19) . Scheduled a telephone CCM follow up with patient/caregiver  Patient Self Care Activities:   Sister Manuel [name] verbalizes understanding of the education/information provided today  . Attends all scheduled provider appointments with assistance from family . Attends church or other social activities with assistance of family and SCAT  . Participates in performing ADL's  . Participates in performing IADL's . Patient is currently UNABLE to self administer his medications (niece Manuel [name] is assisting)    Please see past updates related to this goal by clicking on the "Past Updates" button in the selected goal         Other   . COMPLETED: We would like a Neurology referral"       Current Barriers:  [name] Knowledge Deficit related Dementia  . 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   Interventions:  . Completed CCM telephone follow up with sister Manuel [name] . Reviewed with patient's sister, the new patient Neurology appointment including date/time/location  Goal met  Patient Self Care Activities:  . Sister verbalizes understanding of today's education/information provided . Attends all scheduled provider appointments with assistance from family . Attends church or other social activities with assistance of family and SCAT  . Participates in performing ADL's  . Participates in performing IADL's . Patient is currently UNABLE to self administer his medications (niece Manuel [name] is assisting)   Please see past updates related to this goal by clicking on the "Past Updates" button in the selected goal        The patient verbalized understanding of instructions provided today and declined a print copy of patient instruction materials.   The CM team will reach out to the patient again over the next 2-3 weeks.   Barb Merino, RN,CCM Care Management Coordinator [place] [facility]  Direct Phone: [phone]

## 2018-07-27 NOTE — Chronic Care Management (AMB) (Signed)
Chronic Care Management   Follow Up Note   07/27/2018 Name: Manuel [name] MRN: [id number] DOB: [birth year]  Referred by: Manuel Chard, MD Reason for referral : Chronic Care Management (TELEPHONE CCM FOLLOW UP )   Manuel [name] is a 85 y.o. year old male who is a primary care patient of Manuel Chard, MD. The CCM team was consulted for assistance with chronic disease management and care coordination needs.    Review of patient status, including review of consultants reports, relevant laboratory and other test results, and collaboration with appropriate care team members and the patient's provider was performed as part of comprehensive patient evaluation and provision of chronic care management services.    I spoke with Manuel [name] sister Manuel [name] today by telephone.   Goals Addressed      Patient Stated   . "I would like to get my therapy" (pt-stated)   On track    Current Barriers:  . Impaired memory loss related to Dementia . Deconditioning related to recent hospitalization for Pneumonia  Nurse Case Manager Clinical Goal(s):  [name] Over the next 30 days, patient will have received and or will be receiving in home PT/ST services per MD recommendations.  . Over the next 30 days, patient/caregiver will report adherence to his established HEP, including cognitive/memory exercises.   Interventions:  . Completed CCM telephone follow up with sister Manuel [name] . Assessed for start of in home services (sister confirms services, PT/ST have started through [facility]) . Assessed for adherence and benefits of in home services (sister states patient is getting good benefits from these services) . Assessed for patient adherence to participating in his HEP (pt is adhering but sister admits he is sleeping more now since being restricted to their home during COVID-19) . Scheduled a telephone CCM follow up with patient/caregiver  Patient Self Care Activities:   Sister Manuel [name] verbalizes  understanding of the education/information provided today  . Attends all scheduled provider appointments with assistance from family . Attends church or other social activities with assistance of family and SCAT  . Participates in performing ADL's  . Participates in performing IADL's . Patient is currently UNABLE to self administer his medications (niece Manuel [name] is assisting)  Please see past updates related to this goal by clicking on the "Past Updates" button in the selected goal       Other   . COMPLETED: We would like a Neurology referral"       Current Barriers:  [name] Knowledge Deficit related Dementia  . 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   Interventions:  . Completed CCM telephone follow up with sister Manuel [name] . Reviewed with patient's sister, the new patient Neurology appointment including date/time/location  Goal met  Patient Self Care Activities:  . Sister verbalizes understanding of today's education/information provided . Attends all scheduled provider appointments with assistance from family . Attends church or other social activities with assistance of family and SCAT  . Participates in performing ADL's  . Participates in performing IADL's . Patient is currently UNABLE to self administer his medications (niece Manuel [name] is assisting)  Please see past updates related to this goal by clicking on the "Past Updates" button in the selected goal         The CCM team will reach out to the patient again over the next 2-3 weeks.  Manuel Merino, RN,CCM Care Management Coordinator [place] [facility]  Direct Phone: [phone]

## 2018-07-29 ENCOUNTER — Other Ambulatory Visit: Payer: Self-pay | Admitting: Internal Medicine

## 2018-07-31 ENCOUNTER — Other Ambulatory Visit: Payer: Self-pay

## 2018-07-31 ENCOUNTER — Ambulatory Visit: Payer: Self-pay

## 2018-07-31 DIAGNOSIS — R413 Other amnesia: Secondary | ICD-10-CM

## 2018-07-31 DIAGNOSIS — I10 Essential (primary) hypertension: Secondary | ICD-10-CM

## 2018-07-31 NOTE — Chronic Care Management (AMB) (Signed)
  Chronic Care Management    Clinical Social Work Follow Up Note  07/31/2018 Name: Manuel [name] MRN: [id number] DOB: November 14, 1932  Manuel [name] is a 85 y.o. year old male who is a primary care patient of Dorothyann Peng, MD. The CCM team was consulted for assistance with chronic disease management and care coordination.  Review of patient status, including review of consultants reports, other relevant assessments, and collaboration with appropriate care team members and the patient's provider was performed as part of comprehensive patient evaluation and provision of chronic care management services.     Goals Addressed            This Visit's Progress     Patient Stated   . COMPLETED: "I need to name a healthcare power of attorney" (pt-stated)   On track    Current Barriers:  . Limited education about the difference between a healthcare power of attorney and financial power of attorney  Clinical Social Work Clinical Goal(s):  [name] Over the next 10 days, the patient will complete advance directive packet. . Over the next 30 days, the patient will have advance directives notarized. . Over the next 45 days, the patient will provide a copy of notarized advance directives to his primary care providers office.  Interventions: . Reviewed advance directive document with the patients sister and caregiver Manuel [name] . Assisted with all questions regarding document verbiage . Reiterated the importance of providing a notarized copy to all healthcare providers - the patient will await having the document notarized due to concerns of COVID-19 pandemic . Encouraged the patient and his caregiver to contact SW if future questions regarding advance directives  Patient Self Care Activities:  . The patient is able to perform ADL's independently . The patient is unable to manage medications on his own and relies on his niece for assistance . The patient is able to discuss healthcare needs and  decisions with his sister    Please see past updates related to this goal by clicking on the "Past Updates" button in the selected goal         Follow Up Plan: Client will continue to be engaged with CCM RN Case Manager, no further SW needs at this time.   Bevelyn Ngo, BSW, CDP [facility] Care Management Social Worker [phone]  Total time spent performing care coordination and/or care management activities with the patient by phone or face to face = 10 minutes.

## 2018-07-31 NOTE — Patient Instructions (Signed)
Social Worker Visit Information  Goals we discussed today:  Goals Addressed            This Visit's Progress     Patient Stated   . COMPLETED: "I need to name a healthcare power of attorney" (pt-stated)   On track    Current Barriers:  . Limited education about the difference between a healthcare power of attorney and financial power of attorney  Clinical Social Work Clinical Goal(s):  [name] Over the next 10 days, the patient will complete advance directive packet. . Over the next 30 days, the patient will have advance directives notarized. . Over the next 45 days, the patient will provide a copy of notarized advance directives to his primary care providers office.  Interventions: . Reviewed advance directive document with the patients sister and caregiver Arie Sabina . Assisted with all questions regarding document verbiage . Reiterated the importance of providing a notarized copy to all healthcare providers - the patient will await having the document notarized due to concerns of COVID-19 pandemic . Encouraged the patient and his caregiver to contact SW if future questions regarding advance directives  Patient Self Care Activities:  . The patient is able to perform ADL's independently . The patient is unable to manage medications on his own and relies on his niece for assistance . The patient is able to discuss healthcare needs and decisions with his sister    Please see past updates related to this goal by clicking on the "Past Updates" button in the selected goal          Materials Provided: Verbal education about advance directives provided by phone  Follow Up Plan: Client will continue to be engaged with CCM RN case manager. No further SW needs at this time.   Bevelyn Ngo, BSW, CDP [facility] Care Management Social Worker [phone]

## 2018-08-01 ENCOUNTER — Other Ambulatory Visit: Payer: Self-pay | Admitting: Internal Medicine

## 2018-08-01 DIAGNOSIS — E6609 Other obesity due to excess calories: Secondary | ICD-10-CM | POA: Diagnosis not present

## 2018-08-01 DIAGNOSIS — I10 Essential (primary) hypertension: Secondary | ICD-10-CM | POA: Diagnosis not present

## 2018-08-01 DIAGNOSIS — G309 Alzheimer's disease, unspecified: Secondary | ICD-10-CM | POA: Diagnosis not present

## 2018-08-01 DIAGNOSIS — Z87891 Personal history of nicotine dependence: Secondary | ICD-10-CM | POA: Diagnosis not present

## 2018-08-01 DIAGNOSIS — J189 Pneumonia, unspecified organism: Secondary | ICD-10-CM | POA: Diagnosis not present

## 2018-08-01 DIAGNOSIS — F0281 Dementia in other diseases classified elsewhere with behavioral disturbance: Secondary | ICD-10-CM | POA: Diagnosis not present

## 2018-08-01 DIAGNOSIS — Z6830 Body mass index (BMI) 30.0-30.9, adult: Secondary | ICD-10-CM | POA: Diagnosis not present

## 2018-08-01 DIAGNOSIS — F0391 Unspecified dementia with behavioral disturbance: Secondary | ICD-10-CM | POA: Diagnosis not present

## 2018-08-02 ENCOUNTER — Telehealth: Payer: Self-pay | Admitting: Internal Medicine

## 2018-08-02 NOTE — Telephone Encounter (Signed)
Speech therapist called and states that patient refused speech and physical therapy today

## 2018-08-06 ENCOUNTER — Other Ambulatory Visit: Payer: Self-pay | Admitting: Internal Medicine

## 2018-08-06 NOTE — Telephone Encounter (Signed)
Tramadol refill

## 2018-08-09 ENCOUNTER — Ambulatory Visit: Payer: Self-pay

## 2018-08-09 DIAGNOSIS — G4700 Insomnia, unspecified: Secondary | ICD-10-CM

## 2018-08-09 DIAGNOSIS — R413 Other amnesia: Secondary | ICD-10-CM

## 2018-08-09 DIAGNOSIS — F0391 Unspecified dementia with behavioral disturbance: Secondary | ICD-10-CM

## 2018-08-09 NOTE — Chronic Care Management (AMB) (Signed)
  Chronic Care Management   Follow Up Note   08/09/2018 Name: Manuel [name] MRN: [id number] DOB: Apr 06, 1933  Referred by: Dorothyann Peng, MD Reason for referral : Chronic Care Management (CCM Follow Up)   MAHMOUD MOHAR is a 85 y.o. year old male who is a primary care patient of Dorothyann Peng, MD. The CCM team was consulted for assistance with chronic disease management and care coordination needs.    Review of patient status, including review of consultants reports, relevant laboratory and other test results, and collaboration with appropriate care team members and the patient's provider was performed as part of comprehensive patient evaluation and provision of chronic care management services.    I received an update on this patient from embedded SW [facility].   Goals Addressed      Patient Stated   . "I would like to get my therapy" (pt-stated)       Current Barriers:  . Impaired memory loss related to Dementia . Deconditioning related to recent hospitalization for Pneumonia  Nurse Case Manager Clinical Goal(s):  [name] Over the next 30 days, patient will have received and or will be receiving in home PT/ST services per MD recommendations.  . Over the next 30 days, patient/caregiver will report adherence to his established HEP, including cognitive/memory exercises.   Interventions:   Received update from embedded BSW Bevelyn Ngo that the patient's family reports he is doing very well with his HH PT/OT/ST  Enrique Sack also provided the family with a senior resource for the [employer]; she provided the instructions on how to access this class via facebook live from the patient's livingroom  Scheduled a CCM follow up call for the week of 08/17/18  Patient Self Care Activities:   Sister [name] verbalizes understanding of the education/information provided today  . Attends all scheduled provider appointments with assistance from family . Attends church or other  social activities with assistance of family and SCAT  . Participates in performing ADL's  . Participates in performing IADL's . Patient is currently UNABLE to self administer his medications (niece Sheralyn Boatman is assisting)  Please see past updates related to this goal by clicking on the "Past Updates" button in the selected goal         Telephone follow up appointment with CCM team member scheduled for: 08/17/18   Delsa Sale, [facility] Care Management Coordinator [facility] Care Management/[facility]  Direct Phone: [phone]

## 2018-08-10 DIAGNOSIS — G309 Alzheimer's disease, unspecified: Secondary | ICD-10-CM | POA: Diagnosis not present

## 2018-08-10 DIAGNOSIS — I10 Essential (primary) hypertension: Secondary | ICD-10-CM | POA: Diagnosis not present

## 2018-08-10 DIAGNOSIS — F0281 Dementia in other diseases classified elsewhere with behavioral disturbance: Secondary | ICD-10-CM | POA: Diagnosis not present

## 2018-08-10 DIAGNOSIS — E6609 Other obesity due to excess calories: Secondary | ICD-10-CM | POA: Diagnosis not present

## 2018-08-10 DIAGNOSIS — F0391 Unspecified dementia with behavioral disturbance: Secondary | ICD-10-CM | POA: Diagnosis not present

## 2018-08-10 DIAGNOSIS — Z87891 Personal history of nicotine dependence: Secondary | ICD-10-CM | POA: Diagnosis not present

## 2018-08-10 DIAGNOSIS — J189 Pneumonia, unspecified organism: Secondary | ICD-10-CM | POA: Diagnosis not present

## 2018-08-10 DIAGNOSIS — Z6830 Body mass index (BMI) 30.0-30.9, adult: Secondary | ICD-10-CM | POA: Diagnosis not present

## 2018-08-16 ENCOUNTER — Telehealth: Payer: Self-pay

## 2018-08-16 NOTE — Telephone Encounter (Signed)
Erin called from [facility]. Pt refused his visit this morning. Denny Peon said that she is going to do a non-visit dicharge

## 2018-08-17 ENCOUNTER — Telehealth: Payer: Self-pay

## 2018-08-22 ENCOUNTER — Telehealth: Payer: Self-pay

## 2018-08-23 ENCOUNTER — Telehealth: Payer: Self-pay

## 2018-08-28 DIAGNOSIS — Z87891 Personal history of nicotine dependence: Secondary | ICD-10-CM | POA: Diagnosis not present

## 2018-08-28 DIAGNOSIS — F0281 Dementia in other diseases classified elsewhere with behavioral disturbance: Secondary | ICD-10-CM | POA: Diagnosis not present

## 2018-08-28 DIAGNOSIS — G309 Alzheimer's disease, unspecified: Secondary | ICD-10-CM | POA: Diagnosis not present

## 2018-08-28 DIAGNOSIS — E6609 Other obesity due to excess calories: Secondary | ICD-10-CM | POA: Diagnosis not present

## 2018-08-28 DIAGNOSIS — J189 Pneumonia, unspecified organism: Secondary | ICD-10-CM | POA: Diagnosis not present

## 2018-08-28 DIAGNOSIS — F0391 Unspecified dementia with behavioral disturbance: Secondary | ICD-10-CM | POA: Diagnosis not present

## 2018-08-28 DIAGNOSIS — Z6830 Body mass index (BMI) 30.0-30.9, adult: Secondary | ICD-10-CM | POA: Diagnosis not present

## 2018-08-28 DIAGNOSIS — I10 Essential (primary) hypertension: Secondary | ICD-10-CM | POA: Diagnosis not present

## 2018-08-29 ENCOUNTER — Other Ambulatory Visit: Payer: Self-pay | Admitting: Internal Medicine

## 2018-09-03 ENCOUNTER — Telehealth: Payer: Self-pay

## 2018-09-10 ENCOUNTER — Telehealth: Payer: Self-pay | Admitting: *Deleted

## 2018-09-10 ENCOUNTER — Encounter: Payer: Self-pay | Admitting: *Deleted

## 2018-09-10 NOTE — Telephone Encounter (Signed)
Called patient, spoke with niece, Manuel [name] who patient lives with. I advised her that due to current COVID 19 pandemic, our office is severely reducing in person visits in order to minimize the risk to our patients and healthcare providers. We recommend to reschedule when we are seeing all patient in office again. She agreed, rescheduled for August and placed on wait list in event we open to all patient before Aug. She  verbalized understanding, appreciation.

## 2018-09-11 ENCOUNTER — Ambulatory Visit: Payer: PPO | Admitting: Diagnostic Neuroimaging

## 2018-09-12 ENCOUNTER — Other Ambulatory Visit: Payer: Self-pay | Admitting: Internal Medicine

## 2018-09-12 DIAGNOSIS — F0281 Dementia in other diseases classified elsewhere with behavioral disturbance: Secondary | ICD-10-CM | POA: Diagnosis not present

## 2018-09-12 DIAGNOSIS — E6609 Other obesity due to excess calories: Secondary | ICD-10-CM | POA: Diagnosis not present

## 2018-09-12 DIAGNOSIS — I10 Essential (primary) hypertension: Secondary | ICD-10-CM | POA: Diagnosis not present

## 2018-09-12 DIAGNOSIS — J189 Pneumonia, unspecified organism: Secondary | ICD-10-CM | POA: Diagnosis not present

## 2018-09-12 DIAGNOSIS — F0391 Unspecified dementia with behavioral disturbance: Secondary | ICD-10-CM | POA: Diagnosis not present

## 2018-09-12 DIAGNOSIS — Z87891 Personal history of nicotine dependence: Secondary | ICD-10-CM | POA: Diagnosis not present

## 2018-09-12 DIAGNOSIS — G309 Alzheimer's disease, unspecified: Secondary | ICD-10-CM | POA: Diagnosis not present

## 2018-09-12 DIAGNOSIS — Z6830 Body mass index (BMI) 30.0-30.9, adult: Secondary | ICD-10-CM | POA: Diagnosis not present

## 2018-09-18 ENCOUNTER — Ambulatory Visit: Payer: Self-pay

## 2018-09-18 DIAGNOSIS — G4700 Insomnia, unspecified: Secondary | ICD-10-CM

## 2018-09-18 DIAGNOSIS — F0391 Unspecified dementia with behavioral disturbance: Secondary | ICD-10-CM

## 2018-09-18 DIAGNOSIS — I10 Essential (primary) hypertension: Secondary | ICD-10-CM

## 2018-09-18 NOTE — Chronic Care Management (AMB) (Signed)
  Chronic Care Management   Outreach Note  09/18/2018 Name: Manuel [name] MRN: [id number] DOB: 25-Oct-1932  Referred by: Dorothyann Peng, MD Reason for referral : Chronic Care Management (CCM RN Telephone Follow up )   An unsuccessful telephone outreach was attempted today. The patient was referred to the case management team by Dorothyann Peng MD for assistance with chronic care management and care coordination. I left a HIPAA compliant voice message, requesting a return phone call.   Follow Up Plan: The CCM team will reach out to the patient again over the next 2-3 days.    Delsa Sale, RN,CCM Care Management Coordinator [facility] Care Management/[facility]  Direct Phone: [phone]

## 2018-09-21 ENCOUNTER — Ambulatory Visit (INDEPENDENT_AMBULATORY_CARE_PROVIDER_SITE_OTHER): Payer: PPO

## 2018-09-21 ENCOUNTER — Telehealth: Payer: Self-pay

## 2018-09-21 DIAGNOSIS — F0391 Unspecified dementia with behavioral disturbance: Secondary | ICD-10-CM

## 2018-09-21 DIAGNOSIS — I10 Essential (primary) hypertension: Secondary | ICD-10-CM

## 2018-09-21 DIAGNOSIS — G4700 Insomnia, unspecified: Secondary | ICD-10-CM

## 2018-09-24 NOTE — Chronic Care Management (AMB) (Signed)
  Chronic Care Management   Outreach Note  09/24/2018 Name: Manuel [name] MRN: [id number] DOB: [birth year]  Referred by: Dorothyann Peng, MD Reason for referral : Chronic Care Management (CCM RNCM Telephone Follow Up )   A second unsuccessful telephone outreach was attempted today. The patient was referred to the case management team for assistance with chronic care management and care coordination.    Follow up plan: The care management team will reach out to the patient again over the next 7-14 days.      Delsa Sale, RN,CCM Care Management Coordinator [facility] Care Management/[facility]  Direct Phone: [phone]

## 2018-10-01 ENCOUNTER — Other Ambulatory Visit: Payer: Self-pay | Admitting: Internal Medicine

## 2018-10-01 DIAGNOSIS — G309 Alzheimer's disease, unspecified: Secondary | ICD-10-CM | POA: Diagnosis not present

## 2018-10-01 DIAGNOSIS — E6609 Other obesity due to excess calories: Secondary | ICD-10-CM

## 2018-10-01 DIAGNOSIS — F0281 Dementia in other diseases classified elsewhere with behavioral disturbance: Secondary | ICD-10-CM | POA: Diagnosis not present

## 2018-10-01 DIAGNOSIS — Z8701 Personal history of pneumonia (recurrent): Secondary | ICD-10-CM | POA: Diagnosis not present

## 2018-10-01 DIAGNOSIS — Z9181 History of falling: Secondary | ICD-10-CM | POA: Diagnosis not present

## 2018-10-01 DIAGNOSIS — Z6829 Body mass index (BMI) 29.0-29.9, adult: Secondary | ICD-10-CM

## 2018-10-01 DIAGNOSIS — M4010 Other secondary kyphosis, site unspecified: Secondary | ICD-10-CM | POA: Diagnosis not present

## 2018-10-01 DIAGNOSIS — Z7982 Long term (current) use of aspirin: Secondary | ICD-10-CM

## 2018-10-01 DIAGNOSIS — I10 Essential (primary) hypertension: Secondary | ICD-10-CM | POA: Diagnosis not present

## 2018-10-01 DIAGNOSIS — Z87891 Personal history of nicotine dependence: Secondary | ICD-10-CM | POA: Diagnosis not present

## 2018-10-02 ENCOUNTER — Telehealth: Payer: Self-pay

## 2018-10-09 DIAGNOSIS — Z87891 Personal history of nicotine dependence: Secondary | ICD-10-CM | POA: Diagnosis not present

## 2018-10-09 DIAGNOSIS — I10 Essential (primary) hypertension: Secondary | ICD-10-CM | POA: Diagnosis not present

## 2018-10-09 DIAGNOSIS — F0281 Dementia in other diseases classified elsewhere with behavioral disturbance: Secondary | ICD-10-CM | POA: Diagnosis not present

## 2018-10-09 DIAGNOSIS — Z7982 Long term (current) use of aspirin: Secondary | ICD-10-CM | POA: Diagnosis not present

## 2018-10-09 DIAGNOSIS — Z9181 History of falling: Secondary | ICD-10-CM | POA: Diagnosis not present

## 2018-10-09 DIAGNOSIS — M4010 Other secondary kyphosis, site unspecified: Secondary | ICD-10-CM | POA: Diagnosis not present

## 2018-10-09 DIAGNOSIS — Z6829 Body mass index (BMI) 29.0-29.9, adult: Secondary | ICD-10-CM | POA: Diagnosis not present

## 2018-10-09 DIAGNOSIS — G309 Alzheimer's disease, unspecified: Secondary | ICD-10-CM | POA: Diagnosis not present

## 2018-10-09 DIAGNOSIS — Z8701 Personal history of pneumonia (recurrent): Secondary | ICD-10-CM | POA: Diagnosis not present

## 2018-10-09 DIAGNOSIS — E6609 Other obesity due to excess calories: Secondary | ICD-10-CM | POA: Diagnosis not present

## 2018-10-17 ENCOUNTER — Ambulatory Visit (INDEPENDENT_AMBULATORY_CARE_PROVIDER_SITE_OTHER): Payer: PPO

## 2018-10-17 DIAGNOSIS — F0391 Unspecified dementia with behavioral disturbance: Secondary | ICD-10-CM

## 2018-10-17 DIAGNOSIS — R413 Other amnesia: Secondary | ICD-10-CM

## 2018-10-17 DIAGNOSIS — I10 Essential (primary) hypertension: Secondary | ICD-10-CM

## 2018-10-17 DIAGNOSIS — G4700 Insomnia, unspecified: Secondary | ICD-10-CM

## 2018-10-17 NOTE — Chronic Care Management (AMB) (Signed)
Chronic Care Management   Follow Up Note   10/17/2018 Name: Manuel [name] MRN: [id number] DOB: [birth year]  Referred by: [name], MD Reason for referral : Chronic Care Management (CCM RNCM Telephone Follow Up)   Manuel [name] is a 85 y.o. year old male who is a primary care patient of [name], MD. The CCM team was consulted for assistance with chronic disease management and care coordination needs.    Review of patient status, including review of consultants reports, relevant laboratory and other test results, and collaboration with appropriate care team members and the patient's provider was performed as part of comprehensive patient evaluation and provision of chronic care management services.    I spoke with Manuel [name] sister Manuel [name] by telephone today.   Goals Addressed      Patient Stated   . COMPLETED: "I was in the hospital for Pneumonia; I feel better now" (pt-stated)       Current Barriers:  . High Risk for infection  Nurse Case Manager Clinical Goal(s):  [name] Over the next 30 days, patient will have no ED visits and or IP admissions secondary to infection. Goal Met  CCM RN CM Interventions:  10/17/18 completed call with sister Manuel [name]  . No reported symptoms suggestive of Pneumonia or other upper respiratory s/s  Patient Self Care Activities:   Verbalizes understanding of the education/information provided today  . Attends all scheduled provider appointments with assistance from family . Attends church or other social activities with assistance of family and SCAT  . Participates in performing ADL's  . Participates in performing IADL's . Patient is currently UNABLE to self administer his medications (niece Manuel [name] is assisting)  Please see past updates related to this goal by clicking on the "Past Updates" button in the selected goal     . "I would like to get my therapy" (pt-stated)       Current Barriers:  . Impaired memory loss related  to Dementia . Deconditioning related to recent hospitalization for Pneumonia  Nurse Case Manager Clinical Goal(s):  [name] Over the next 30 days, patient will have received and or will be receiving in home PT/ST services per MD recommendations. Goal Met  . Over the next 30 days, patient/caregiver will report adherence to his established HEP, including cognitive/memory exercises. Goal Met . 10/17/18 Over the next 30 days patient will have no new or worsening symptoms of cognitive or behavior decline.  [name] 10/17/18 Over the next 60 days, patient will have completed his Neurology consult and patient/caregiver will have increased knowledge and understanding about patient's diagnosis and treatment management for Dementia.   CCM RN CM Interventions:  10/17/18 completed call with sister Manuel [name]  . Assessed for ongoing activity with in home PT/ST - sister reports patient was reinstated for ongoing services and is doing extremely well . Discussed patient's new patient appointment for Neurology date/time . Assessed for new or worsening s/s of cognitive/behavior decline  . Discussed plans with patient for ongoing care management follow up and provided patient with direct contact information for care management team  Patient Self Care Activities:   Sister Manuel [name] verbalizes understanding of the education/information provided today  . Attends all scheduled provider appointments with assistance from family . Attends church or other social activities with assistance of family and SCAT  . Participates in performing ADL's  . Participates in performing IADL's . Patient is currently UNABLE to self administer his medications (niece Manuel [name] is assisting)  Please see  past updates related to this goal by clicking on the "Past Updates" button in the selected goal       Other   . COMPLETED: "He is waking up at 3 AM every night"       Current Barriers:  . Dementia . Impaired Sleep Pattern  Nurse Case Manager  Clinical Goal(s):  Over the next 30 days, patient will demonstrate having an improved sleep pattern as evidence by patient will awaken during normal daylight hours. Goal Met  CCM RN CM Interventions:  10/17/18 call completed with patient's sister Manuel [name]   No reported concerns related to patient's sleep pattern - pt continues to take Melatonin with improvement in sleep pattern noted  Patient Self Care Activities:  Manuel [name] understanding of today's education/information provided . Attends all scheduled provider appointments with assistance from family . Attends church or other social activities with assistance of family and SCAT  . Participates in performing ADL's  . Participates in performing IADL's . Patient is currently UNABLE to self administer his medications (niece Manuel [name] is assisting)  Please see past updates related to this goal by clicking on the "Past Updates" button in the selected goal     . COMPLETED: "He needs several medication refills"       Current Barriers:  . Unable to self administer medications due to Dementia  Nurse Case Manager Clinical Goal(s):   Over the next 30 days patient will have all medications refilled; niece will report patient is taking his medications exactly as prescribed without missed doses. Goal Met  CCM RN CM Interventions:  10/17/18 completed call with patient's sister Manuel [name]  . No questions or concerns related to patient's medications - patient is adherent to his prescribed medication regimen - sister and niece assist with meds  Patient Self Care Activities:   Verbalizes understanding of the education/information provided today . Attends all scheduled provider appointments with assistance from family . Attends church or other social activities with assistance of family and SCAT  . Participates in performing ADL's  . Participates in performing IADL's . Patient is currently UNABLE to self administer his medications (niece Manuel [name]  is assisting)  Please see past updates related to this goal by clicking on the "Past Updates" button in the selected goal     . COMPLETED: "His BP can be high"       Current Barriers:  [name] Knowledge Deficit related to Hypertension  Nurse Case Manager Clinical Goal(s):   Over the next 30 days, patient's niece will have increased knowledge related to patient's target BP per PCP goal. Goal Met . Over the next 30 days, patient's niece will monitor patient's BP at home and call the CCM team and or PCP to report any/all abnormal readings Goal Met  CCM RN CM Interventions:  10/17/18 call completed with sister Manuel [name]  . No reported issues or concerns related to patient's BP . Sister and niece are able to verbalize target BP goal and report patient's BP remains to be WNL when checked at home  Patient Self Care Activities:  Manuel [name] understanding of today's education/information provided . Attends all scheduled provider appointments with assistance from family . Attends church or other social activities with assistance of family and SCAT  . Participates in performing ADL's  . Participates in performing IADL's . Patient is currently UNABLE to self administer his medications (niece Manuel [name] is assisting)  Please see past updates related to this goal by clicking on the "Past Updates" button  in the selected goal         The care management team will reach out to the patient again over the next 10-14 days.    Barb Merino, RN, BSN, CCM Care Management Coordinator [facility]  Direct Phone: [phone]

## 2018-10-17 NOTE — Patient Instructions (Signed)
Visit Information  Goals Addressed      Patient Stated   . COMPLETED: "I was in the hospital for Pneumonia; I feel better now" (pt-stated)       Current Barriers:  . High Risk for infection  Nurse Case Manager Clinical Goal(s):  [name] Over the next 30 days, patient will have no ED visits and or IP admissions secondary to infection. Goal Met  CCM RN CM Interventions:  10/17/18 completed call with sister Deeann Cree  . No reported symptoms suggestive of Pneumonia or other upper respiratory s/s  Patient Self Care Activities:   Verbalizes understanding of the education/information provided today  . Attends all scheduled provider appointments with assistance from family . Attends church or other social activities with assistance of family and SCAT  . Participates in performing ADL's  . Participates in performing IADL's . Patient is currently UNABLE to self administer his medications (niece [name] is assisting)  Please see past updates related to this goal by clicking on the "Past Updates" button in the selected goal     . "I would like to get my therapy" (pt-stated)       Current Barriers:  . Impaired memory loss related to Dementia . Deconditioning related to recent hospitalization for Pneumonia  Nurse Case Manager Clinical Goal(s):  [name] Over the next 30 days, patient will have received and or will be receiving in home PT/ST services per MD recommendations. Goal Met  . Over the next 30 days, patient/caregiver will report adherence to his established HEP, including cognitive/memory exercises. Goal Met . 10/17/18 Over the next 30 days patient will have no new or worsening symptoms of cognitive or behavior decline.  [name] 10/17/18 Over the next 60 days, patient will have completed his Neurology consult and patient/caregiver will have increased knowledge and understanding about patient's diagnosis and treatment management for Dementia.   CCM RN CM Interventions:  10/17/18 completed call with  sister Deeann Cree  . Assessed for ongoing activity with in home PT/ST - sister reports patient was reinstated for ongoing services and is doing extremely well . Discussed patient's new patient appointment for Neurology date/time . Assessed for new or worsening s/s of cognitive/behavior decline  . Discussed plans with patient for ongoing care management follow up and provided patient with direct contact information for care management team  Patient Self Care Activities:   Sister [name] verbalizes understanding of the education/information provided today  . Attends all scheduled provider appointments with assistance from family . Attends church or other social activities with assistance of family and SCAT  . Participates in performing ADL's  . Participates in performing IADL's . Patient is currently UNABLE to self administer his medications (niece [name] is assisting)  Please see past updates related to this goal by clicking on the "Past Updates" button in the selected goal       Other   . COMPLETED: "He is waking up at 3 AM every night"       Current Barriers:  . Dementia . Impaired Sleep Pattern  Nurse Case Manager Clinical Goal(s):  Over the next 30 days, patient will demonstrate having an improved sleep pattern as evidence by patient will awaken during normal daylight hours. Goal Met  CCM RN CM Interventions:  10/17/18 call completed with patient's sister Deeann Cree   No reported concerns related to patient's sleep pattern - pt continues to take Melatonin with improvement in sleep pattern noted  Patient Self Care Activities:  Rosezena Sensor understanding of today's  education/information provided . Attends all scheduled provider appointments with assistance from family . Attends church or other social activities with assistance of family and SCAT  . Participates in performing ADL's  . Participates in performing IADL's . Patient is currently UNABLE to self administer his  medications (niece [name] is assisting)   Please see past updates related to this goal by clicking on the "Past Updates" button in the selected goal     . COMPLETED: "He needs several medication refills"       Current Barriers:  . Unable to self administer medications due to Dementia  Nurse Case Manager Clinical Goal(s):   Over the next 30 days patient will have all medications refilled; niece will report patient is taking his medications exactly as prescribed without missed doses. Goal Met  CCM RN CM Interventions:  10/17/18 completed call with patient's sister Deeann Cree  . No questions or concerns related to patient's medications - patient is adherent to his prescribed medication regimen - sister and niece assist with meds  Patient Self Care Activities:   Verbalizes understanding of the education/information provided today . Attends all scheduled provider appointments with assistance from family . Attends church or other social activities with assistance of family and SCAT  . Participates in performing ADL's  . Participates in performing IADL's . Patient is currently UNABLE to self administer his medications (niece [name] is assisting)  Please see past updates related to this goal by clicking on the "Past Updates" button in the selected goal     . COMPLETED: "His BP can be high"       Current Barriers:  [name] Knowledge Deficit related to Hypertension  Nurse Case Manager Clinical Goal(s):   Over the next 30 days, patient's niece will have increased knowledge related to patient's target BP per PCP goal. Goal Met . Over the next 30 days, patient's niece will monitor patient's BP at home and call the CCM team and or PCP to report any/all abnormal readings Goal Met  CCM RN CM Interventions:  10/17/18 call completed with sister Deeann Cree  . No reported issues or concerns related to patient's BP . Sister and niece are able to verbalize target BP goal and report patient's BP remains  to be WNL when checked at home  Patient Self Care Activities:  Rosezena Sensor understanding of today's education/information provided . Attends all scheduled provider appointments with assistance from family . Attends church or other social activities with assistance of family and SCAT  . Participates in performing ADL's  . Participates in performing IADL's . Patient is currently UNABLE to self administer his medications (niece [name] is assisting)   Please see past updates related to this goal by clicking on the "Past Updates" button in the selected goal         The patient verbalized understanding of instructions provided today and declined a print copy of patient instruction materials.   The care management team will reach out to the patient again over the next 10-14 days.   Barb Merino, RN, BSN, CCM Care Management Coordinator [facility] [facility]  Direct Phone: [phone]

## 2018-10-26 DIAGNOSIS — I10 Essential (primary) hypertension: Secondary | ICD-10-CM | POA: Diagnosis not present

## 2018-10-26 DIAGNOSIS — F0281 Dementia in other diseases classified elsewhere with behavioral disturbance: Secondary | ICD-10-CM | POA: Diagnosis not present

## 2018-10-26 DIAGNOSIS — G309 Alzheimer's disease, unspecified: Secondary | ICD-10-CM | POA: Diagnosis not present

## 2018-10-26 DIAGNOSIS — Z8701 Personal history of pneumonia (recurrent): Secondary | ICD-10-CM | POA: Diagnosis not present

## 2018-10-26 DIAGNOSIS — Z7982 Long term (current) use of aspirin: Secondary | ICD-10-CM | POA: Diagnosis not present

## 2018-10-26 DIAGNOSIS — Z87891 Personal history of nicotine dependence: Secondary | ICD-10-CM | POA: Diagnosis not present

## 2018-10-26 DIAGNOSIS — M4010 Other secondary kyphosis, site unspecified: Secondary | ICD-10-CM | POA: Diagnosis not present

## 2018-10-26 DIAGNOSIS — E6609 Other obesity due to excess calories: Secondary | ICD-10-CM | POA: Diagnosis not present

## 2018-10-26 DIAGNOSIS — Z6829 Body mass index (BMI) 29.0-29.9, adult: Secondary | ICD-10-CM | POA: Diagnosis not present

## 2018-10-26 DIAGNOSIS — Z9181 History of falling: Secondary | ICD-10-CM | POA: Diagnosis not present

## 2018-10-31 ENCOUNTER — Telehealth: Payer: Self-pay

## 2018-11-09 DIAGNOSIS — G309 Alzheimer's disease, unspecified: Secondary | ICD-10-CM | POA: Diagnosis not present

## 2018-11-09 DIAGNOSIS — Z9181 History of falling: Secondary | ICD-10-CM | POA: Diagnosis not present

## 2018-11-09 DIAGNOSIS — F0281 Dementia in other diseases classified elsewhere with behavioral disturbance: Secondary | ICD-10-CM | POA: Diagnosis not present

## 2018-11-09 DIAGNOSIS — M4010 Other secondary kyphosis, site unspecified: Secondary | ICD-10-CM | POA: Diagnosis not present

## 2018-11-09 DIAGNOSIS — Z8701 Personal history of pneumonia (recurrent): Secondary | ICD-10-CM | POA: Diagnosis not present

## 2018-11-09 DIAGNOSIS — Z6829 Body mass index (BMI) 29.0-29.9, adult: Secondary | ICD-10-CM | POA: Diagnosis not present

## 2018-11-09 DIAGNOSIS — Z7982 Long term (current) use of aspirin: Secondary | ICD-10-CM | POA: Diagnosis not present

## 2018-11-09 DIAGNOSIS — Z87891 Personal history of nicotine dependence: Secondary | ICD-10-CM | POA: Diagnosis not present

## 2018-11-09 DIAGNOSIS — E6609 Other obesity due to excess calories: Secondary | ICD-10-CM | POA: Diagnosis not present

## 2018-11-09 DIAGNOSIS — I10 Essential (primary) hypertension: Secondary | ICD-10-CM | POA: Diagnosis not present

## 2018-11-12 DIAGNOSIS — Z8546 Personal history of malignant neoplasm of prostate: Secondary | ICD-10-CM | POA: Diagnosis not present

## 2018-11-13 ENCOUNTER — Other Ambulatory Visit: Payer: Self-pay | Admitting: Internal Medicine

## 2018-11-19 DIAGNOSIS — N3281 Overactive bladder: Secondary | ICD-10-CM | POA: Diagnosis not present

## 2018-11-26 ENCOUNTER — Ambulatory Visit: Payer: PPO | Admitting: Diagnostic Neuroimaging

## 2018-11-26 ENCOUNTER — Encounter: Payer: Self-pay | Admitting: Diagnostic Neuroimaging

## 2018-11-26 ENCOUNTER — Other Ambulatory Visit: Payer: Self-pay

## 2018-11-26 VITALS — BP 135/78 | HR 80 | Temp 98.6°F | Ht 67.0 in | Wt 190.4 lb

## 2018-11-26 DIAGNOSIS — F03B18 Unspecified dementia, moderate, with other behavioral disturbance: Secondary | ICD-10-CM | POA: Insufficient documentation

## 2018-11-26 DIAGNOSIS — F0391 Unspecified dementia with behavioral disturbance: Secondary | ICD-10-CM

## 2018-11-26 MED ORDER — MEMANTINE HCL 10 MG PO TABS: 10.0000 mg | ORAL_TABLET | Freq: Two times a day (BID) | ORAL | 12 refills | Status: DC

## 2018-11-26 NOTE — Progress Notes (Addendum)
[facility]  PATIENT: Manuel GessBloise E [name] DOB: 10/24/1932  REFERRING CLINICIAN: Maggie Font Sanders HISTORY FROM: patient and sister REASON FOR VISIT: new consult    HISTORICAL  CHIEF COMPLAINT:  Chief Complaint  Patient presents with  . Dementia    rm 7 New Pt, sister- [name]  MMSE 14    HISTORY OF PRESENT ILLNESS:   85 year old male here for evaluation of memory loss and dementia.  Patient has gradual onset progressive short-term memory loss, confusion, decline in ADLs since 2015.  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  Apparently he was starting to have urinary incontinence at that time but did not have the insight and judgment to handle this correctly.  He was found urinating outside of the dumpsters in the back of the store.  Patient has continued and progressive loss of memory in ADLs.  He is able to maintain most of his own personal hygiene issues.  However he is unable to use a phone, shop, prepare food, take care of his medications, drive.  Mood is stable at times, but other times he has short temper and agitation.   REVIEW OF SYSTEMS: Full 14 system review of systems performed and negative with exception of: As per HPI.  ALLERGIES: Allergies  Allergen Reactions  . Penicillins Swelling    Did it involve swelling of the face/tongue/throat, SOB, or low BP? Yes Did it involve sudden or severe rash/hives, skin peeling, or any reaction on the inside of your mouth or nose? No Did you need to seek medical attention at a hospital or doctor's office? No When did it last happen?2013 If all above answers are "NO", may proceed with cephalosporin use.     HOME MEDICATIONS: Outpatient Medications Prior to Visit  Medication Sig Dispense Refill  . acetaminophen (TYLENOL) 500 MG tablet Take 1,000 mg by mouth every 6 (six) hours as needed for moderate pain.    [name]. amLODipine (NORVASC) 5 MG tablet TAKE 1 TABLET BY MOUTH EVERY DAY 90  tablet 3  . aspirin EC 81 MG tablet Take 81 mg by mouth every evening.     . Calcium Carbonate Antacid (CALCIUM CARBONATE PO) Take 100 mg by mouth. 3 x weekly    . cholecalciferol (VITAMIN D3) 25 MCG (1000 UT) tablet Take 2,000 Units by mouth every evening.    . [employer] MELATONIN 3 MG TABS TAKE 1 TABLET (3 MG TOTAL) BY MOUTH AT BEDTIME. 90 tablet 1  . losartan-hydrochlorothiazide (HYZAAR) 100-25 MG tablet Take 1 tablet by mouth daily. 90 tablet 1  . tamsulosin (FLOMAX) 0.4 MG CAPS capsule TAKE 1 CAPSULE BY MOUTH EVERY DAY 90 capsule 1  . vitamin E 200 UNIT capsule Take 200 Units by mouth every evening.    . hydrochlorothiazide (HYDRODIURIL) 25 MG tablet TAKE 1 TABLET BY MOUTH EVERY DAY 90 tablet 0  . cyclobenzaprine (FLEXERIL) 5 MG tablet One tablet po qhs prn (Patient not taking: Reported on 11/26/2018) 30 tablet 0  . traMADol (ULTRAM) 50 MG tablet ONE NIGHTLY AS NEEDED FOR PAIN (Patient not taking: Reported on 11/26/2018) 30 tablet 0  . losartan (COZAAR) 100 MG tablet TAKE 1 TABLET BY MOUTH EVERY DAY 90 tablet 1   No facility-administered medications prior to visit.     PAST MEDICAL HISTORY: Past Medical History:  Diagnosis Date  . Alzheimer's dementia (HCC)   . Hypertension   . Prostate disorder     PAST SURGICAL HISTORY: Past Surgical History:  Procedure Laterality Date  .  TOTAL HIP ARTHROPLASTY      FAMILY HISTORY: Family History  Problem Relation Age of Onset  . Early death Mother   . Prostate cancer Father     SOCIAL HISTORY: Social History   Socioeconomic History  . Marital status: Single    Spouse name: Not on file  . Number of children: 0  . Years of education: Not on file  . Highest education level: 7th grade  Occupational History    Comment: retiredd  Social Needs  . Financial resource strain: Not on file  . Food insecurity    Worry: Never true    Inability: Never true  . Transportation needs    Medical: No    Non-medical: No  Tobacco Use  . Smoking  status: Former Smoker    Years: 2.00  . Smokeless tobacco: Never Used  Substance and Sexual Activity  . Alcohol use: No  . Drug use: No  . Sexual activity: Not on file  Lifestyle  . Physical activity    Days per week: 3 days    Minutes per session: 20 min  . Stress: Not at all  Relationships  . Social connections    Talks on phone: More than three times a week    Gets together: More than three times a week    Attends religious service: More than 4 times per year    Active member of club or organization: Yes    Attends meetings of clubs or organizations: More than 4 times per year    Relationship status: Not on file  . Intimate partner violence    Fear of current or ex partner: No    Emotionally abused: No    Physically abused: No    Forced sexual activity: No  Other Topics Concern  . Not on file  Social History Narrative   11/26/18 lives w/youngest sister and niece, Sheralyn Boatmanoni      PHYSICAL EXAM  GENERAL EXAM/CONSTITUTIONAL: Vitals:  Vitals:   11/26/18 0845  BP: 135/78  Pulse: 80  Temp: 98.6 F (37 C)  Weight: 190 lb 6.4 oz (86.4 kg)  Height: 5\' 7"  (1.702 m)     Body mass index is 29.82 kg/m. Wt Readings from Last 3 Encounters:  11/26/18 190 lb 6.4 oz (86.4 kg)  06/29/18 185 lb (83.9 kg)  06/24/18 183 lb (83 kg)     Patient is in no distress; well developed, nourished and groomed; neck is supple  CARDIOVASCULAR:  Examination of carotid arteries is normal; no carotid bruits  Regular rate and rhythm, no murmurs  Examination of peripheral vascular system by observation and palpation is normal  EYES:  Ophthalmoscopic exam of optic discs and posterior segments is normal; no papilledema or hemorrhages  No exam data present  MUSCULOSKELETAL:  Gait, strength, tone, movements noted in Neurologic exam below  NEUROLOGIC: MENTAL STATUS:  MMSE - Mini Mental State Exam 11/26/2018  Orientation to time 1  Orientation to Place 4  Registration 3  Attention/  Calculation 0  Recall 0  Language- name 2 objects 2  Language- repeat 0  Language- follow 3 step command 3  Language- read & follow direction 1  Write a sentence 0  Copy design 0  Total score 14    awake, alert, oriented to person  [facility] memory   DECR attention and concentration  language fluent, comprehension intact, naming intact  fund of knowledge appropriate  CALM  CRANIAL NERVE:   2nd - no papilledema on fundoscopic exam  2nd, 3rd, 4th, 6th - pupils equal and reactive to light, visual fields full to confrontation, extraocular muscles intact, no nystagmus  5th - facial sensation symmetric  7th - facial strength symmetric  8th - hearing intact  9th - palate elevates symmetrically, uvula midline  11th - shoulder shrug symmetric  12th - tongue protrusion midline  MOTOR:   normal bulk and tone, full strength in the BUE, BLE  SENSORY:   normal and symmetric to light touch  COORDINATION:   finger-nose-finger, fine finger movements SLOW  REFLEXES:   deep tendon reflexes TRACE and symmetric  GAIT/STATION:   SEVERE KYPHOSIS; STOOPED OVER GAIT; USING SINGLE POINT CANE; UNSTEADY     DIAGNOSTIC DATA (LABS, IMAGING, TESTING) - I reviewed patient records, labs, notes, testing and imaging myself where available.  Lab Results  Component Value Date   WBC 7.1 06/25/2018   HGB 10.1 (L) 06/25/2018   HCT 32.7 (L) 06/25/2018   MCV 95.3 06/25/2018   PLT 209 06/25/2018      Component Value Date/Time   NA 135 06/29/2018 1624   K 3.8 06/29/2018 1624   CL 92 (L) 06/29/2018 1624   CO2 24 06/29/2018 1624   GLUCOSE 99 06/29/2018 1624   GLUCOSE 118 (H) 06/25/2018 0549   BUN 20 06/29/2018 1624   CREATININE 1.41 (H) 06/29/2018 1624   CALCIUM 9.0 06/29/2018 1624   PROT 6.5 06/25/2018 0549   PROT 7.1 06/06/2018 1132   ALBUMIN 3.0 (L) 06/25/2018 0549   ALBUMIN 3.9 06/06/2018 1132   AST 13 (L) 06/25/2018 0549   ALT 10 06/25/2018 0549   ALKPHOS 48 06/25/2018  0549   BILITOT 0.6 06/25/2018 0549   BILITOT 0.2 06/06/2018 1132   GFRNONAA 46 (L) 06/29/2018 1624   GFRAA 53 (L) 06/29/2018 1624   Lab Results  Component Value Date   CHOL 150 06/06/2018   HDL 46 06/06/2018   LDLCALC 80 06/06/2018   TRIG 119 06/06/2018   CHOLHDL 3.3 06/06/2018   Lab Results  Component Value Date   HGBA1C 5.1 06/06/2018   Lab Results  Component Value Date   DQQIWLNL89 211 06/06/2018   Lab Results  Component Value Date   TSH 1.970 06/06/2018    06/24/18 CT head [I reviewed images myself and agree with interpretation. -VRP]  - Chronic atrophic and ischemic changes without acute abnormality.     ASSESSMENT AND PLAN  85 y.o. year old male here with gradual onset progressive short-term memory loss, confusion, decline in ADLs since 2015, most consistent with neurodegenerative dementia.   Dx:  1. Moderate dementia with behavioral disturbance (HCC)     PLAN:  MODERATE DEMENTIA - start memantine 10mg  at bedtime; increase to twice a day after 1-2 weeks - safety / supervision issues reviewed - caregiver resources provided - no driving  Meds ordered this encounter  Medications  . memantine (NAMENDA) 10 MG tablet    Sig: Take 1 tablet (10 mg total) by mouth 2 (two) times daily.    Dispense:  60 tablet    Refill:  12   Return for return to PCP, pending if symptoms worsen or fail to improve.    Penni Bombard, MD 12/28/1738, 8:14 AM Certified in Neurology, Neurophysiology and Neuroimaging  [facility] [address], [place], [place] [address] [phone]

## 2018-11-26 NOTE — Patient Instructions (Signed)
MODERATE DEMENTIA - start memantine 10mg  at bedtime; increase to twice a day after 1-2 weeks  - safety / supervision issues reviewed  - caregiver resources provided  - no driving

## 2018-11-28 ENCOUNTER — Telehealth: Payer: Self-pay | Admitting: Internal Medicine

## 2018-11-28 ENCOUNTER — Telehealth: Payer: Self-pay

## 2018-11-28 NOTE — Telephone Encounter (Signed)
The pt's niece [name] called and said that the pt needed a verbal order to extend physical therapy with [facility].  I called and spoke with the manager at [facility] and was told that she spoke with the pt's insurance and was told that his insurance doesn't cover maintance physical therapy and that the pt has been discharged because he is at his max for his function.

## 2018-11-28 NOTE — Telephone Encounter (Signed)
PT NIECE [facility] [place] TO EXTEND PHYSICAL THERAPY AND NEEDS VERBAL ORDER FROM PROVIDER, TONI DID NOT HAVE ANY PHONE NUMBERS OR CONTACT PERSON INFO, SHE STTD THAT ANYONE OVER AT [place]

## 2018-12-03 ENCOUNTER — Telehealth: Payer: Self-pay

## 2018-12-19 ENCOUNTER — Other Ambulatory Visit: Payer: Self-pay | Admitting: Diagnostic Neuroimaging

## 2018-12-19 NOTE — Telephone Encounter (Signed)
Received request to change namenda to 90 day supply. This was done.

## 2018-12-26 ENCOUNTER — Ambulatory Visit (INDEPENDENT_AMBULATORY_CARE_PROVIDER_SITE_OTHER): Payer: Medicare Other

## 2018-12-26 ENCOUNTER — Encounter: Payer: Self-pay | Admitting: Internal Medicine

## 2018-12-26 ENCOUNTER — Other Ambulatory Visit: Payer: Self-pay

## 2018-12-26 ENCOUNTER — Ambulatory Visit (INDEPENDENT_AMBULATORY_CARE_PROVIDER_SITE_OTHER): Payer: Medicare Other | Admitting: Internal Medicine

## 2018-12-26 VITALS — BP 136/70 | HR 87 | Temp 98.3°F | Ht 67.2 in | Wt 187.4 lb

## 2018-12-26 VITALS — BP 136/70 | HR 87 | Temp 98.3°F | Ht <= 58 in | Wt 187.4 lb

## 2018-12-26 DIAGNOSIS — M4000 Postural kyphosis, site unspecified: Secondary | ICD-10-CM | POA: Diagnosis not present

## 2018-12-26 DIAGNOSIS — I10 Essential (primary) hypertension: Secondary | ICD-10-CM

## 2018-12-26 DIAGNOSIS — Z23 Encounter for immunization: Secondary | ICD-10-CM

## 2018-12-26 DIAGNOSIS — Z79899 Other long term (current) drug therapy: Secondary | ICD-10-CM | POA: Diagnosis not present

## 2018-12-26 DIAGNOSIS — Z0000 Encounter for general adult medical examination without abnormal findings: Secondary | ICD-10-CM

## 2018-12-26 DIAGNOSIS — F0391 Unspecified dementia with behavioral disturbance: Secondary | ICD-10-CM | POA: Diagnosis not present

## 2018-12-26 DIAGNOSIS — R269 Unspecified abnormalities of gait and mobility: Secondary | ICD-10-CM | POA: Diagnosis not present

## 2018-12-26 MED ORDER — PREVNAR 13 IM SUSP: 0.5000 mL | INTRAMUSCULAR | 0 refills | Status: AC

## 2018-12-26 MED ORDER — BOOSTRIX 5-2.5-18.5 LF-MCG/0.5 IM SUSP: 0.5000 mL | Freq: Once | INTRAMUSCULAR | 0 refills | Status: AC

## 2018-12-26 NOTE — Progress Notes (Signed)
Subjective:   Manuel [name] is a 85 y.o. male who presents for Medicare Annual/Subsequent preventive examination.  Review of Systems:  n/a Cardiac Risk Factors include: advanced age (>34mn, >>59women);hypertension;sedentary lifestyle     Objective:    Vitals: BP 136/70 (Patient Position: Sitting)   Pulse 87   Temp 98.3 F (36.8 C) (Oral)   Ht 5' 7.2" (1.707 m)   Wt 187 lb 6.4 oz (85 kg)   BMI 29.18 kg/m   Body mass index is 29.18 kg/m.  Advanced Directives 12/26/2018 07/04/2018 06/24/2018 06/24/2018 12/11/2017  Does Patient Have a Medical Advance Directive? Yes No No No No  Type of AParamedicof AHavanaLiving will - - - -  Copy of H[place] Chart? No - copy requested - - - -  Would patient like information on creating a medical advance directive? - Yes (MAU/Ambulatory/Procedural Areas - Information given) No - Patient declined No - Patient declined Yes (MAU/Ambulatory/Procedural Areas - Information given)    Tobacco Social History   Tobacco Use  Smoking Status Former Smoker  . Years: 2.00  Smokeless Tobacco Never Used     Counseling given: Not Answered   Clinical Intake:  Pre-visit preparation completed: Yes  Pain : No/denies pain     Nutritional Status: BMI 25 -29 Overweight Nutritional Risks: None Diabetes: No  How often do you need to have someone help you when you read instructions, pamphlets, or other written materials from your doctor or pharmacy?: 5 - Always What is the last grade level you completed in school?: 7th grade  Interpreter Needed?: No  Information entered by :: Manuel [name]  Past Medical History:  Diagnosis Date  . Alzheimer's dementia ([place]   . Hypertension   . Prostate disorder    Past Surgical History:  Procedure Laterality Date  . TOTAL HIP ARTHROPLASTY     Family History  Problem Relation Age of Onset  . Early death Mother   . Prostate cancer Father    Social History    Socioeconomic History  . Marital status: Single    Spouse name: Not on file  . Number of children: 0  . Years of education: Not on file  . Highest education level: 7th grade  Occupational History  . Occupation: retired    Comment: retiredd  Social Needs  . Financial resource strain: Not on file  . Food insecurity    Worry: Never true    Inability: Never true  . Transportation needs    Medical: No    Non-medical: No  Tobacco Use  . Smoking status: Former Smoker    Years: 2.00  . Smokeless tobacco: Never Used  Substance and Sexual Activity  . Alcohol use: No  . Drug use: No  . Sexual activity: Not Currently  Lifestyle  . Physical activity    Days per week: 0 days    Minutes per session: 0 min  . Stress: Not at all  Relationships  . Social connections    Talks on phone: More than three times a week    Gets together: More than three times a week    Attends religious service: More than 4 times per year    Active member of club or organization: Yes    Attends meetings of clubs or organizations: More than 4 times per year    Relationship status: Not on file  Other Topics Concern  . Not on file  Social History Narrative  11/26/18 lives w/youngest sister and niece, Manuel [name]     Outpatient Encounter Medications as of 12/26/2018  Medication Sig  . acetaminophen (TYLENOL) 500 MG tablet Take 1,000 mg by mouth every 6 (six) hours as needed for moderate pain.  [name] amLODipine (NORVASC) 5 MG tablet TAKE 1 TABLET BY MOUTH EVERY DAY  . aspirin EC 81 MG tablet Take 81 mg by mouth every evening.   . Calcium Carbonate Antacid (CALCIUM CARBONATE PO) Take 100 mg by mouth. 3 x weekly  . cholecalciferol (VITAMIN D3) 25 MCG (1000 UT) tablet Take 2,000 Units by mouth every evening.  . [employer] MELATONIN 3 MG TABS TAKE 1 TABLET (3 MG TOTAL) BY MOUTH AT BEDTIME.  . cyclobenzaprine (FLEXERIL) 5 MG tablet One tablet po qhs prn (Patient not taking: Reported on 11/26/2018)  . losartan-hydrochlorothiazide  (HYZAAR) 100-25 MG tablet Take 1 tablet by mouth daily.  . memantine (NAMENDA) 10 MG tablet TAKE 1 TABLET BY MOUTH TWICE A DAY  . pneumococcal 13-valent conjugate vaccine (PREVNAR 13) SUSP injection Inject 0.5 mLs into the muscle tomorrow at 10 am for 1 dose.  . tamsulosin (FLOMAX) 0.4 MG CAPS capsule TAKE 1 CAPSULE BY MOUTH EVERY DAY  . Tdap (BOOSTRIX) 5-2.5-18.5 LF-MCG/0.5 injection Inject 0.5 mLs into the muscle once for 1 dose.  . traMADol (ULTRAM) 50 MG tablet ONE NIGHTLY AS NEEDED FOR PAIN (Patient not taking: Reported on 11/26/2018)  . vitamin E 200 UNIT capsule Take 200 Units by mouth every evening.   No facility-administered encounter medications on file as of 12/26/2018.     Activities of Daily Living In your present state of health, do you have any difficulty performing the following activities: 12/26/2018 06/24/2018  Hearing? Y N  Comment sometimes -  Vision? N N  Difficulty concentrating or making decisions? Y N  Comment dementia -  Walking or climbing stairs? Y N  Dressing or bathing? N N  Doing errands, shopping? N Y  Conservation officer, nature and eating ? N -  Using the Toilet? N -  In the past six months, have you accidently leaked urine? Y -  Comment wears depends -  Do you have problems with loss of bowel control? N -  Managing your Medications? Y -  Managing your Finances? Y -  Housekeeping or managing your Housekeeping? Y -  Some recent data might be hidden    Patient Care Team: Manuel Chard, MD as PCP - General (Internal Medicine) Manuel [name], Manuel Stapler, RN as Case Manager Manuel [name] as Social Worker   Assessment:   This is a routine wellness examination for Manuel [name].  Exercise Activities and Dietary recommendations Current Exercise Habits: The patient does not participate in regular exercise at present  Goals    . "I would like to get my therapy" (pt-stated)     Current Barriers:  . Impaired memory loss related to Dementia . Deconditioning related to recent  hospitalization for Pneumonia  Nurse Case Manager Clinical Goal(s):  [name] Over the next 30 days, patient will have received and or will be receiving in home PT/ST services per MD recommendations. Goal Met  . Over the next 30 days, patient/caregiver will report adherence to his established HEP, including cognitive/memory exercises. Goal Met . 10/17/18 Over the next 30 days patient will have no new or worsening symptoms of cognitive or behavior decline.  [name] 10/17/18 Over the next 60 days, patient will have completed his Neurology consult and patient/caregiver will have increased knowledge and understanding about patient's diagnosis and treatment  management for Dementia.   CCM RN CM Interventions:  10/17/18 completed call with sister Deeann Cree  . Assessed for ongoing activity with in home PT/ST - sister reports patient was reinstated for ongoing services and is doing extremely well . Discussed patient's new patient appointment for Neurology date/time . Assessed for new or worsening s/s of cognitive/behavior decline  . Discussed plans with patient for ongoing care management follow up and provided patient with direct contact information for care management team  Patient Self Care Activities:   Sister [name] verbalizes understanding of the education/information provided today  . Attends all scheduled provider appointments with assistance from family . Attends church or other social activities with assistance of family and [facility]  . Participates in performing ADL's  . Participates in performing IADL's . Patient is currently UNABLE to self administer his medications (niece Manuel [name] is assisting)  Please see past updates related to this goal by clicking on the "Past Updates" button in the selected goal          Fall Risk Fall Risk  12/26/2018 12/26/2018 11/26/2018 06/29/2018 12/11/2017  Falls in the past year? 0 0 0 0 No  Risk for fall due to : Medication side effect;Impaired balance/gait - - - -  Follow  up Falls evaluation completed;Education provided;Falls prevention discussed - - - -   Is the patient's home free of loose throw rugs in walkways, pet beds, electrical cords, etc?   yes      Grab bars in the bathroom? yes      Handrails on the stairs?   yes      Adequate lighting?   yes  Timed Get Up and Go Performed: n/a  Depression Screen PHQ 2/9 Scores 12/26/2018 06/29/2018 12/11/2017  PHQ - 2 Score 0 0 -  Exception Documentation Other- indicate reason in comment box - Other- indicate reason in comment box  Not completed just completed by CMA - call completed with sister    Cognitive Function MMSE - Mini Mental State Exam 11/26/2018  Orientation to time 1  Orientation to Place 4  Registration 3  Attention/ Calculation 0  Recall 0  Language- name 2 objects 2  Language- repeat 0  Language- follow 3 step command 3  Language- read & follow direction 1  Write a sentence 0  Copy design 0  Total score 14        Immunization History  Administered Date(s) Administered  . Influenza, High Dose Seasonal PF 12/26/2018    Qualifies for Shingles Vaccine? yes  Screening Tests Health Maintenance  Topic Date Due  . TETANUS/TDAP  02/21/1952  . PNA vac Low Risk Adult (1 of 2 - PCV13) 02/22/2000  . INFLUENZA VACCINE  Completed   Cancer Screenings: Lung: Low Dose CT Chest recommended if Age 64-80 years, 30 pack-year currently smoking OR have quit w/in 15years. Patient does not qualify. Colorectal: not required  Additional Screenings:  Hepatitis C Screening:n/a      Plan:    No goals set at this time. 6 CIT not completed due to dementia diagnosis.  I have personally reviewed and noted the following in the patient's chart:   . Medical and social history . Use of alcohol, tobacco or illicit drugs  . Current medications and supplements . Functional ability and status . Nutritional status . Physical activity . Advanced directives . List of other physicians . Hospitalizations,  surgeries, and ER visits in previous 12 months . Vitals . Screenings to include cognitive, depression, and falls .  Referrals and appointments  In addition, I have reviewed and discussed with patient certain preventive protocols, quality metrics, and best practice recommendations. A written personalized care plan for preventive services as well as general preventive health recommendations were provided to patient.     Kellie Simmering, [name]  09/27/3327

## 2018-12-26 NOTE — Patient Instructions (Signed)
How to Use a Kasandra Knudsen  Canes are used to help with walking. Using a cane makes you more stable, reduces pain, and eases strain on certain muscle groups. There are various kinds of canes. Most have either a single point, four points (quad cane), or three points at the bottom. The best kind of cane for you depends on what you need it for. People with arthritis generally do well with a single-point cane. People who have certain neurological conditions, such as people who have had a stroke, may do better with a quad cane because it allows them to put more weight on it (support more of their body weight). How to choose a cane that fits It is important to use a cane that fits properly. A cane fits properly if the top of the cane comes to your wrist joint when you are standing upright with your arm relaxed at your side. How to use your cane Hold your cane in the hand opposite the injured or weaker side. Always move the cane and the foot of the weaker side with each other (in unison). Walking  Put as much weight on the cane as necessary to make walking comfortable, stable, and smooth.  Stand tall with good posture and look ahead, not down at your feet.  Hold the cane about 2 inches (5 cm) in front or to the side of you.  Each time you take a step with your injured leg, move the cane at the same time to help balance you. Going up steps  Step first with your stronger foot.  Move the cane and the weaker foot up the step at the same time.  Always use the railing with your free hand. Going down steps  Step down first with the cane and your weaker foot.  Then follow with your stronger foot.  Always use the railing with your free hand. Safety tips for home Take these steps to make your home safer when you are walking with a cane:  Be familiar with your home environment.  Have sturdy handrails in your bathrooms and hallways.  Wear nonslip, comfortable, well-fitting footwear.  Use night-lights in  the dark.  Keep floor surfaces clean and dry.  Keep high-traffic areas uncluttered.  Remove any rugs, cords, or loose objects from the floor. Contact a health care provider if:  You still feel unsteady on your feet while using the cane.  You develop new pain, such as pain in your back, shoulder, wrist, or hip.  You develop any numbness or tingling. Get help right away if:  You fall. Summary  Using a cane makes you more stable, reduces pain, and eases strain on certain muscle groups.  A cane fits properly if the top of the cane comes to your wrist joint when you are standing upright with your arm relaxed at your side.  The best kind of cane for you depends on what you need it for.  Hold your cane in the hand opposite the injured or weaker side.  Always move the cane and the foot of the weaker side with each other (in unison). This information is not intended to replace advice given to you by your health care provider. Make sure you discuss any questions you have with your health care provider. Document Released: 04/11/2005 Document Revised: 05/14/2016 Document Reviewed: 05/14/2016 Elsevier Patient Education  2020 [employer].

## 2018-12-26 NOTE — Patient Instructions (Signed)
Manuel [name] , Thank you for taking time to come for your Medicare Wellness Visit. I appreciate your ongoing commitment to your health goals. Please review the following plan we discussed and let me know if I can assist you in the future.   Screening recommendations/referrals: Colonoscopy: not required Recommended yearly ophthalmology/optometry visit for glaucoma screening and checkup Recommended yearly dental visit for hygiene and checkup  Vaccinations: Influenza vaccine: today Pneumococcal vaccine: sent to pharmacy Tdap vaccine: sent to pharmacy Shingles vaccine: discussed    Advanced directives: Please bring a copy of your POA (Power of Attorney) and/or Living Will to your next appointment.    Conditions/risks identified: overweight  Next appointment: 01/17/2019 at 2:45  Preventive Care 15 Years and Older, Male Preventive care refers to lifestyle choices and visits with your health care provider that can promote health and wellness. What does preventive care include?  A yearly physical exam. This is also called an annual well check.  Dental exams once or twice a year.  Routine eye exams. Ask your health care provider how often you should have your eyes checked.  Personal lifestyle choices, including:  Daily care of your teeth and gums.  Regular physical activity.  Eating a healthy diet.  Avoiding tobacco and drug use.  Limiting alcohol use.  Practicing safe sex.  Taking low doses of aspirin every day.  Taking vitamin and mineral supplements as recommended by your health care provider. What happens during an annual well check? The services and screenings done by your health care provider during your annual well check will depend on your age, overall health, lifestyle risk factors, and family history of disease. Counseling  Your health care provider may ask you questions about your:  Alcohol use.  Tobacco use.  Drug use.  Emotional well-being.  Home and  relationship well-being.  Sexual activity.  Eating habits.  History of falls.  Memory and ability to understand (cognition).  Work and work Statistician. Screening  You may have the following tests or measurements:  Height, weight, and BMI.  Blood pressure.  Lipid and cholesterol levels. These may be checked every 5 years, or more frequently if you are over 74 years old.  Skin check.  Lung cancer screening. You may have this screening every year starting at age 72 if you have a 30-pack-year history of smoking and currently smoke or have quit within the past 15 years.  Fecal occult blood test (FOBT) of the stool. You may have this test every year starting at age 52.  Flexible sigmoidoscopy or colonoscopy. You may have a sigmoidoscopy every 5 years or a colonoscopy every 10 years starting at age 22.  Prostate cancer screening. Recommendations will vary depending on your family history and other risks.  Hepatitis C blood test.  Hepatitis B blood test.  Sexually transmitted disease (STD) testing.  Diabetes screening. This is done by checking your blood sugar (glucose) after you have not eaten for a while (fasting). You may have this done every 1-3 years.  Abdominal aortic aneurysm (AAA) screening. You may need this if you are a current or former smoker.  Osteoporosis. You may be screened starting at age 54 if you are at high risk. Talk with your health care provider about your test results, treatment options, and if necessary, the need for more tests. Vaccines  Your health care provider may recommend certain vaccines, such as:  Influenza vaccine. This is recommended every year.  Tetanus, diphtheria, and acellular pertussis (Tdap, Td) vaccine. You may  need a Td booster every 10 years.  Zoster vaccine. You may need this after age 60.  Pneumococcal 13-valent conjugate (PCV13) vaccine. One dose is recommended after age 31.  Pneumococcal polysaccharide (PPSV23) vaccine. One  dose is recommended after age 33. Talk to your health care provider about which screenings and vaccines you need and how often you need them. This information is not intended to replace advice given to you by your health care provider. Make sure you discuss any questions you have with your health care provider. Document Released: 05/08/2015 Document Revised: 12/30/2015 Document Reviewed: 02/10/2015 Elsevier Interactive Patient Education  2017 [place] Prevention in the Home Falls can cause injuries. They can happen to people of all ages. There are many things you can do to make your home safe and to help prevent falls. What can I do on the outside of my home?  Regularly fix the edges of walkways and driveways and fix any cracks.  Remove anything that might make you trip as you walk through a door, such as a raised step or threshold.  Trim any bushes or trees on the path to your home.  Use bright outdoor lighting.  Clear any walking paths of anything that might make someone trip, such as rocks or tools.  Regularly check to see if handrails are loose or broken. Make sure that both sides of any steps have handrails.  Any raised decks and porches should have guardrails on the edges.  Have any leaves, snow, or ice cleared regularly.  Use sand or salt on walking paths during winter.  Clean up any spills in your garage right away. This includes oil or grease spills. What can I do in the bathroom?  Use night lights.  Install grab bars by the toilet and in the tub and shower. Do not use towel bars as grab bars.  Use non-skid mats or decals in the tub or shower.  If you need to sit down in the shower, use a plastic, non-slip stool.  Keep the floor dry. Clean up any water that spills on the floor as soon as it happens.  Remove soap buildup in the tub or shower regularly.  Attach bath mats securely with double-sided non-slip rug tape.  Do not have throw rugs and other  things on the floor that can make you trip. What can I do in the bedroom?  Use night lights.  Make sure that you have a light by your bed that is easy to reach.  Do not use any sheets or blankets that are too big for your bed. They should not hang down onto the floor.  Have a firm chair that has side arms. You can use this for support while you get dressed.  Do not have throw rugs and other things on the floor that can make you trip. What can I do in the kitchen?  Clean up any spills right away.  Avoid walking on wet floors.  Keep items that you use a lot in easy-to-reach places.  If you need to reach something above you, use a strong step stool that has a grab bar.  Keep electrical cords out of the way.  Do not use floor polish or wax that makes floors slippery. If you must use wax, use non-skid floor wax.  Do not have throw rugs and other things on the floor that can make you trip. What can I do with my stairs?  Do not leave any items on  the stairs.  Make sure that there are handrails on both sides of the stairs and use them. Fix handrails that are broken or loose. Make sure that handrails are as long as the stairways.  Check any carpeting to make sure that it is firmly attached to the stairs. Fix any carpet that is loose or worn.  Avoid having throw rugs at the top or bottom of the stairs. If you do have throw rugs, attach them to the floor with carpet tape.  Make sure that you have a light switch at the top of the stairs and the bottom of the stairs. If you do not have them, ask someone to add them for you. What else can I do to help prevent falls?  Wear shoes that:  Do not have high heels.  Have rubber bottoms.  Are comfortable and fit you well.  Are closed at the toe. Do not wear sandals.  If you use a stepladder:  Make sure that it is fully opened. Do not climb a closed stepladder.  Make sure that both sides of the stepladder are locked into place.  Ask  someone to hold it for you, if possible.  Clearly mark and make sure that you can see:  Any grab bars or handrails.  First and last steps.  Where the edge of each step is.  Use tools that help you move around (mobility aids) if they are needed. These include:  Canes.  Walkers.  Scooters.  Crutches.  Turn on the lights when you go into a dark area. Replace any light bulbs as soon as they burn out.  Set up your furniture so you have a clear path. Avoid moving your furniture around.  If any of your floors are uneven, fix them.  If there are any pets around you, be aware of where they are.  Review your medicines with your doctor. Some medicines can make you feel dizzy. This can increase your chance of falling. Ask your doctor what other things that you can do to help prevent falls. This information is not intended to replace advice given to you by your health care provider. Make sure you discuss any questions you have with your health care provider. Document Released: 02/05/2009 Document Revised: 09/17/2015 Document Reviewed: 05/16/2014 Elsevier Interactive Patient Education  2017 [employer].

## 2018-12-27 LAB — CMP14+EGFR
ALT: 18 IU/L (ref 0–44)
AST: 21 IU/L (ref 0–40)
Albumin/Globulin Ratio: 1.2 (ref 1.2–2.2)
Albumin: 4.1 g/dL (ref 3.6–4.6)
Alkaline Phosphatase: 73 IU/L (ref 39–117)
BUN/Creatinine Ratio: 8 — ABNORMAL LOW (ref 10–24)
BUN: 11 mg/dL (ref 8–27)
Bilirubin Total: 0.2 mg/dL (ref 0.0–1.2)
CO2: 25 mmol/L (ref 20–29)
Calcium: 9.4 mg/dL (ref 8.6–10.2)
Chloride: 91 mmol/L — ABNORMAL LOW (ref 96–106)
Creatinine, Ser: 1.3 mg/dL — ABNORMAL HIGH (ref 0.76–1.27)
GFR calc Af Amer: 58 mL/min/{1.73_m2} — ABNORMAL LOW (ref >59)
GFR calc non Af Amer: 50 mL/min/{1.73_m2} — ABNORMAL LOW (ref >59)
Globulin, Total: 3.3 g/dL (ref 1.5–4.5)
Glucose: 86 mg/dL (ref 65–99)
Potassium: 3.5 mmol/L (ref 3.5–5.2)
Sodium: 129 mmol/L — ABNORMAL LOW (ref 134–144)
Total Protein: 7.4 g/dL (ref 6.0–8.5)

## 2018-12-27 LAB — DRUG SCREEN, URINE
Amphetamines, Urine: NEGATIVE ng/mL
Barbiturate screen, urine: NEGATIVE ng/mL
Benzodiazepine Quant, Ur: NEGATIVE ng/mL
Cannabinoid Quant, Ur: NEGATIVE ng/mL
Cocaine (Metab.): NEGATIVE ng/mL
Opiate Quant, Ur: NEGATIVE ng/mL
PCP Quant, Ur: NEGATIVE ng/mL

## 2018-12-27 LAB — LIPID PANEL
Chol/HDL Ratio: 3.3 ratio (ref 0.0–5.0)
Cholesterol, Total: 153 mg/dL (ref 100–199)
HDL: 47 mg/dL (ref >39)
LDL Chol Calc (NIH): 82 mg/dL (ref 0–99)
Triglycerides: 134 mg/dL (ref 0–149)
VLDL Cholesterol Cal: 24 mg/dL (ref 5–40)

## 2019-01-01 ENCOUNTER — Other Ambulatory Visit: Payer: Self-pay

## 2019-01-01 ENCOUNTER — Ambulatory Visit (INDEPENDENT_AMBULATORY_CARE_PROVIDER_SITE_OTHER): Payer: Medicare Other

## 2019-01-01 ENCOUNTER — Telehealth: Payer: Self-pay

## 2019-01-01 DIAGNOSIS — I10 Essential (primary) hypertension: Secondary | ICD-10-CM

## 2019-01-01 DIAGNOSIS — F0391 Unspecified dementia with behavioral disturbance: Secondary | ICD-10-CM | POA: Diagnosis not present

## 2019-01-02 NOTE — Chronic Care Management (AMB) (Signed)
Chronic Care Management   Follow Up Note   01/01/2019 Name: ALEPH NICKSON MRN: [id number] DOB: September 25, 1932  Referred by: [name], MD Reason for referral : Chronic Care Management (CCM RNCM Telephone Follow up )   DECORIAN SCHUENEMANN is a 85 y.o. year old male who is a primary care patient of [name], MD. The CCM team was consulted for assistance with chronic disease management and care coordination needs.    Review of patient status, including review of consultants reports, relevant laboratory and other test results, and collaboration with appropriate care team members and the patient's provider was performed as part of comprehensive patient evaluation and provision of chronic care management services.    SDOH (Social Determinants of Health) screening performed today: None. See Care Plan for related entries.   I spoke with patient's sister Deeann Cree and niece [name] by telephone today to follow up on his dementia and impaired gait disturbance.   Outpatient Encounter Medications as of 01/01/2019  Medication Sig  . acetaminophen (TYLENOL) 500 MG tablet Take 1,000 mg by mouth every 6 (six) hours as needed for moderate pain.  [name] amLODipine (NORVASC) 5 MG tablet TAKE 1 TABLET BY MOUTH EVERY DAY  . aspirin EC 81 MG tablet Take 81 mg by mouth every evening.   . Calcium Carbonate Antacid (CALCIUM CARBONATE PO) Take 100 mg by mouth. 3 x weekly  . cholecalciferol (VITAMIN D3) 25 MCG (1000 UT) tablet Take 2,000 Units by mouth every evening.  . CVS MELATONIN 3 MG TABS TAKE 1 TABLET (3 MG TOTAL) BY MOUTH AT BEDTIME.  . cyclobenzaprine (FLEXERIL) 5 MG tablet One tablet po qhs prn (Patient not taking: Reported on 11/26/2018)  . losartan-hydrochlorothiazide (HYZAAR) 100-25 MG tablet Take 1 tablet by mouth daily.  . memantine (NAMENDA) 10 MG tablet TAKE 1 TABLET BY MOUTH TWICE A DAY  . tamsulosin (FLOMAX) 0.4 MG CAPS capsule TAKE 1 CAPSULE BY MOUTH EVERY DAY  . traMADol (ULTRAM) 50 MG  tablet ONE NIGHTLY AS NEEDED FOR PAIN (Patient not taking: Reported on 11/26/2018)  . vitamin E 200 UNIT capsule Take 200 Units by mouth every evening.   No facility-administered encounter medications on file as of 01/01/2019.      Goals Addressed      Patient Stated   . COMPLETED: "I would like to get my therapy" (pt-stated)       Current Barriers:  . Impaired memory loss related to Dementia . Deconditioning related to recent hospitalization for Pneumonia  Nurse Case Manager Clinical Goal(s):  [name] Over the next 30 days, patient will have received and or will be receiving in home PT/ST services per MD recommendations. Goal Met  . Over the next 30 days, patient/caregiver will report adherence to his established HEP, including cognitive/memory exercises. Goal Met . 10/17/18 Over the next 30 days patient will have no new or worsening symptoms of cognitive or behavior decline. Goal Met . 10/17/18 Over the next 60 days, patient will have completed his Neurology consult and patient/caregiver will have increased knowledge and understanding about patient's diagnosis and treatment management for Dementia. Goal Met  CCM RN CM Interventions:  01/01/19 completed call with sister Deeann Cree and niece [name]  . Assessed for ongoing activity with in home PT/ST - discussed patient completed previous service; discussed new referral was sent by Dr. Baird Cancer for PT to evaluate and treat and to perform a HSE . Discussed Neurologist added Namenda to patient's treatment regimen; family reports no significant  change to patient's dementia at this time . Discussed Neuro will continue to follow, family will follow Neuro MD recommendations  Patient Self Care Activities:   Sister [name] verbalizes understanding of the education/information provided today  . Attends all scheduled provider appointments with assistance from family . Attends church or other social activities with assistance of family and SCAT  .  Participates in performing ADL's  . Participates in performing IADL's . Patient is currently UNABLE to self administer his medications (niece [name] is assisting)  Please see past updates related to this goal by clicking on the "Past Updates" button in the selected goal         Other   . "My uncle will start PT again for help with balance and strengthening"       Current Barriers:  [name] Knowledge Deficits related to impaired physical mobility and gait disturbance  Nurse Case Manager Clinical Goal(s):  [name] Over the next 60 days, patient will work with in home PT to address needs related to impaired gait and strengthening  CCM RN CM Interventions:  01/01/19 call completed with patient  . Evaluation of current treatment plan related to impaired physical mobility/gait disturbance and patient's adherence to plan as established by provider. . Advised patient to contact the CCM team if care coordination is needed for DME; discussed the family should notify Dr. Baird Cancer and or the CCM team of any/all falls that may occur  . Reviewed medications with patient and discussed indication, dosage and frequency of Namenda newly prescribed by established Neurologist  . Discussed plans with patient for ongoing care management follow up and provided patient with direct contact information for care management team .   Patient Self Care Activities with assistance from sister and niece  . Attends all scheduled provider appointments . Performs ADL's independently . Performs IADL's independently . Calls provider office for new concerns or questions  Initial goal documentation         Telephone follow up appointment with care management team member scheduled for: 02/12/19   Barb Merino, RN, BSN, CCM Care Management Coordinator [facility]  Direct Phone: [phone]

## 2019-01-02 NOTE — Patient Instructions (Signed)
Visit Information  Goals Addressed      Patient Stated   . COMPLETED: "I would like to get my therapy" (pt-stated)       Current Barriers:  . Impaired memory loss related to Dementia . Deconditioning related to recent hospitalization for Pneumonia  Nurse Case Manager Clinical Goal(s):  [name] Over the next 30 days, patient will have received and or will be receiving in home PT/ST services per MD recommendations. Goal Met  . Over the next 30 days, patient/caregiver will report adherence to his established HEP, including cognitive/memory exercises. Goal Met . 10/17/18 Over the next 30 days patient will have no new or worsening symptoms of cognitive or behavior decline. Goal Met . 10/17/18 Over the next 60 days, patient will have completed his Neurology consult and patient/caregiver will have increased knowledge and understanding about patient's diagnosis and treatment management for Dementia. Goal Met  CCM RN CM Interventions:  01/01/19 completed call with sister Deeann Cree and niece [name]  . Assessed for ongoing activity with in home PT/ST - discussed patient completed previous service; discussed new referral was sent by Dr. Baird Cancer for PT to evaluate and treat and to perform a HSE . Discussed Neurologist added Namenda to patient's treatment regimen; family reports no significant change to patient's dementia at this time . Discussed Neuro will continue to follow, family will follow Neuro MD recommendations  Patient Self Care Activities:   Sister [name] verbalizes understanding of the education/information provided today  . Attends all scheduled provider appointments with assistance from family . Attends church or other social activities with assistance of family and SCAT  . Participates in performing ADL's  . Participates in performing IADL's . Patient is currently UNABLE to self administer his medications (niece [name] is assisting)  Please see past updates related to this goal by  clicking on the "Past Updates" button in the selected goal       Other   . "My uncle will start PT again for help with balance and strengthening"       Current Barriers:  [name] Knowledge Deficits related to impaired physical mobility and gait disturbance  Nurse Case Manager Clinical Goal(s):  [name] Over the next 60 days, patient will work with in home PT to address needs related to impaired gait and strengthening  CCM RN CM Interventions:  01/01/19 call completed with sister Deeann Cree and niece [name]  . Evaluation of current treatment plan related to impaired physical mobility/gait disturbance and patient's adherence to plan as established by provider. . Advised patient to contact the CCM team if care coordination is needed for DME; discussed the family should notify Dr. Baird Cancer and or the CCM team of any/all falls that may occur  . Reviewed medications with patient and discussed indication, dosage and frequency of Namenda newly prescribed by established Neurologist  . Discussed plans with patient for ongoing care management follow up and provided patient with direct contact information for care management team  Patient Self Care Activities with assistance from sister and niece  . Attends all scheduled provider appointments . Performs ADL's independently . Performs IADL's independently . Calls provider office for new concerns or questions  Initial goal documentation        The patient verbalized understanding of instructions provided today and declined a print copy of patient instruction materials.   Telephone follow up appointment with care management team member scheduled for: 02/12/19  Barb Merino, RN, BSN, CCM Care Management Coordinator [facility]  Direct Phone: [phone]

## 2019-01-09 ENCOUNTER — Other Ambulatory Visit: Payer: Self-pay | Admitting: Internal Medicine

## 2019-01-13 NOTE — Progress Notes (Signed)
Subjective:     Patient ID: Manuel [name] , male    DOB: 1932-05-22 , 85 y.o.   MRN: [id number]   Chief Complaint  Patient presents with  . Hypertension    HPI  Hypertension This is a chronic problem. The current episode started more than 1 year ago. The problem has been gradually improving since onset. The problem is controlled. Pertinent negatives include no blurred vision, chest pain, palpitations or shortness of breath. Risk factors for coronary artery disease include male gender. Past treatments include angiotensin blockers and diuretics. The current treatment provides moderate improvement. Compliance problems include exercise.      Past Medical History:  Diagnosis Date  . Alzheimer's dementia ([place])   . Hypertension   . Prostate disorder      Family History  Problem Relation Age of Onset  . Early death Mother   . Prostate cancer Father      Current Outpatient Medications:  .  acetaminophen (TYLENOL) 500 MG tablet, Take 1,000 mg by mouth every 6 (six) hours as needed for moderate pain., Disp: , Rfl:  .  amLODipine (NORVASC) 5 MG tablet, TAKE 1 TABLET BY MOUTH EVERY DAY, Disp: 90 tablet, Rfl: 3 .  aspirin EC 81 MG tablet, Take 81 mg by mouth every evening. , Disp: , Rfl:  .  Calcium Carbonate Antacid (CALCIUM CARBONATE PO), Take 100 mg by mouth. 3 x weekly, Disp: , Rfl:  .  cholecalciferol (VITAMIN D3) 25 MCG (1000 UT) tablet, Take 2,000 Units by mouth every evening., Disp: , Rfl:  .  CVS MELATONIN 3 MG TABS, TAKE 1 TABLET (3 MG TOTAL) BY MOUTH AT BEDTIME., Disp: 90 tablet, Rfl: 1 .  memantine (NAMENDA) 10 MG tablet, TAKE 1 TABLET BY MOUTH TWICE A DAY, Disp: 180 tablet, Rfl: 3 .  tamsulosin (FLOMAX) 0.4 MG CAPS capsule, TAKE 1 CAPSULE BY MOUTH EVERY DAY, Disp: 90 capsule, Rfl: 1 .  vitamin E 200 UNIT capsule, Take 200 Units by mouth every evening., Disp: , Rfl:  .  cyclobenzaprine (FLEXERIL) 5 MG tablet, One tablet po qhs prn (Patient not taking: Reported on 11/26/2018),  Disp: 30 tablet, Rfl: 0 .  losartan-hydrochlorothiazide (HYZAAR) 100-25 MG tablet, TAKE 1 TABLET BY MOUTH EVERY DAY, Disp: 90 tablet, Rfl: 0 .  traMADol (ULTRAM) 50 MG tablet, ONE NIGHTLY AS NEEDED FOR PAIN (Patient not taking: Reported on 11/26/2018), Disp: 30 tablet, Rfl: 0   Allergies  Allergen Reactions  . Penicillins Swelling    Did it involve swelling of the face/tongue/throat, SOB, or low BP? Yes Did it involve sudden or severe rash/hives, skin peeling, or any reaction on the inside of your mouth or nose? No Did you need to seek medical attention at a hospital or doctor's office? No When did it last happen?2013 If all above answers are "NO", may proceed with cephalosporin use.      Review of Systems  Constitutional: Negative.   Eyes: Negative for blurred vision.  Respiratory: Negative.  Negative for shortness of breath.   Cardiovascular: Negative.  Negative for chest pain and palpitations.  Gastrointestinal: Negative.   Neurological: Negative.   Psychiatric/Behavioral: Negative.      Today's Vitals   12/26/18 1436  BP: 136/70  Pulse: 87  Temp: 98.3 F (36.8 C)  TempSrc: Oral  SpO2: 98%  Weight: 187 lb 6.4 oz (85 kg)  Height: 4' 9.2" (1.453 m)   Body mass index is 40.27 kg/m.   Objective:  Physical Exam Vitals signs and  nursing note reviewed.  Constitutional:      Appearance: Normal appearance. He is obese.  Cardiovascular:     Rate and Rhythm: Normal rate and regular rhythm.     Heart sounds: Normal heart sounds.  Pulmonary:     Effort: Pulmonary effort is normal.     Breath sounds: Normal breath sounds.  Musculoskeletal:     Comments: Marked kyphosis  Skin:    General: Skin is warm.  Neurological:     General: No focal deficit present.     Mental Status: He is alert.  Psychiatric:        Mood and Affect: Mood normal.         Assessment And Plan:     1. Essential hypertension, benign  Chronic, fair control. He will continue with current  meds. He is encouraged to avoid adding salt to his foods and to increase daily activity.   - CMP14+EGFR - Lipid panel  2. Dementia with behavioral disturbance, unspecified dementia type (HCC)  Chronic, yet stable. He will continue with current meds.    3. Gait abnormality  Both the patient and his niece agree to [facility] PT services to help with strength and endurance.   - Ambulatory referral to Home Health   4. Drug therapy  - Drug Screen, Urine  5. Kyphosis (acquired) (postural)  Chronic.     [name], MD    THE PATIENT IS ENCOURAGED TO PRACTICE SOCIAL DISTANCING DUE TO THE COVID-19 PANDEMIC.

## 2019-01-15 ENCOUNTER — Other Ambulatory Visit: Payer: Self-pay | Admitting: Internal Medicine

## 2019-01-17 ENCOUNTER — Ambulatory Visit: Payer: Self-pay | Admitting: Internal Medicine

## 2019-01-28 DIAGNOSIS — Z87891 Personal history of nicotine dependence: Secondary | ICD-10-CM

## 2019-01-28 DIAGNOSIS — G309 Alzheimer's disease, unspecified: Secondary | ICD-10-CM | POA: Diagnosis not present

## 2019-01-28 DIAGNOSIS — M4000 Postural kyphosis, site unspecified: Secondary | ICD-10-CM | POA: Diagnosis not present

## 2019-01-28 DIAGNOSIS — F0281 Dementia in other diseases classified elsewhere with behavioral disturbance: Secondary | ICD-10-CM | POA: Diagnosis not present

## 2019-01-28 DIAGNOSIS — I10 Essential (primary) hypertension: Secondary | ICD-10-CM | POA: Diagnosis not present

## 2019-01-28 DIAGNOSIS — E7800 Pure hypercholesterolemia, unspecified: Secondary | ICD-10-CM

## 2019-01-28 DIAGNOSIS — N429 Disorder of prostate, unspecified: Secondary | ICD-10-CM

## 2019-02-01 DIAGNOSIS — M4000 Postural kyphosis, site unspecified: Secondary | ICD-10-CM | POA: Diagnosis not present

## 2019-02-01 DIAGNOSIS — G309 Alzheimer's disease, unspecified: Secondary | ICD-10-CM | POA: Diagnosis not present

## 2019-02-01 DIAGNOSIS — E7800 Pure hypercholesterolemia, unspecified: Secondary | ICD-10-CM | POA: Diagnosis not present

## 2019-02-01 DIAGNOSIS — I10 Essential (primary) hypertension: Secondary | ICD-10-CM | POA: Diagnosis not present

## 2019-02-01 DIAGNOSIS — Z87891 Personal history of nicotine dependence: Secondary | ICD-10-CM | POA: Diagnosis not present

## 2019-02-05 DIAGNOSIS — G309 Alzheimer's disease, unspecified: Secondary | ICD-10-CM | POA: Diagnosis not present

## 2019-02-05 DIAGNOSIS — I10 Essential (primary) hypertension: Secondary | ICD-10-CM | POA: Diagnosis not present

## 2019-02-05 DIAGNOSIS — E7800 Pure hypercholesterolemia, unspecified: Secondary | ICD-10-CM | POA: Diagnosis not present

## 2019-02-05 DIAGNOSIS — Z87891 Personal history of nicotine dependence: Secondary | ICD-10-CM | POA: Diagnosis not present

## 2019-02-05 DIAGNOSIS — M4000 Postural kyphosis, site unspecified: Secondary | ICD-10-CM | POA: Diagnosis not present

## 2019-02-07 DIAGNOSIS — M4000 Postural kyphosis, site unspecified: Secondary | ICD-10-CM | POA: Diagnosis not present

## 2019-02-07 DIAGNOSIS — I10 Essential (primary) hypertension: Secondary | ICD-10-CM | POA: Diagnosis not present

## 2019-02-07 DIAGNOSIS — E7800 Pure hypercholesterolemia, unspecified: Secondary | ICD-10-CM | POA: Diagnosis not present

## 2019-02-07 DIAGNOSIS — G309 Alzheimer's disease, unspecified: Secondary | ICD-10-CM | POA: Diagnosis not present

## 2019-02-07 DIAGNOSIS — Z87891 Personal history of nicotine dependence: Secondary | ICD-10-CM | POA: Diagnosis not present

## 2019-02-12 ENCOUNTER — Telehealth: Payer: Self-pay

## 2019-02-12 DIAGNOSIS — Z87891 Personal history of nicotine dependence: Secondary | ICD-10-CM | POA: Diagnosis not present

## 2019-02-12 DIAGNOSIS — G309 Alzheimer's disease, unspecified: Secondary | ICD-10-CM | POA: Diagnosis not present

## 2019-02-12 DIAGNOSIS — E7800 Pure hypercholesterolemia, unspecified: Secondary | ICD-10-CM | POA: Diagnosis not present

## 2019-02-12 DIAGNOSIS — M4000 Postural kyphosis, site unspecified: Secondary | ICD-10-CM | POA: Diagnosis not present

## 2019-02-12 DIAGNOSIS — I10 Essential (primary) hypertension: Secondary | ICD-10-CM | POA: Diagnosis not present

## 2019-02-14 DIAGNOSIS — E7800 Pure hypercholesterolemia, unspecified: Secondary | ICD-10-CM | POA: Diagnosis not present

## 2019-02-14 DIAGNOSIS — I10 Essential (primary) hypertension: Secondary | ICD-10-CM | POA: Diagnosis not present

## 2019-02-14 DIAGNOSIS — Z87891 Personal history of nicotine dependence: Secondary | ICD-10-CM | POA: Diagnosis not present

## 2019-02-14 DIAGNOSIS — G309 Alzheimer's disease, unspecified: Secondary | ICD-10-CM | POA: Diagnosis not present

## 2019-02-14 DIAGNOSIS — M4000 Postural kyphosis, site unspecified: Secondary | ICD-10-CM | POA: Diagnosis not present

## 2019-02-19 DIAGNOSIS — M4000 Postural kyphosis, site unspecified: Secondary | ICD-10-CM | POA: Diagnosis not present

## 2019-02-19 DIAGNOSIS — Z87891 Personal history of nicotine dependence: Secondary | ICD-10-CM | POA: Diagnosis not present

## 2019-02-19 DIAGNOSIS — E7800 Pure hypercholesterolemia, unspecified: Secondary | ICD-10-CM | POA: Diagnosis not present

## 2019-02-19 DIAGNOSIS — I10 Essential (primary) hypertension: Secondary | ICD-10-CM | POA: Diagnosis not present

## 2019-02-19 DIAGNOSIS — G309 Alzheimer's disease, unspecified: Secondary | ICD-10-CM | POA: Diagnosis not present

## 2019-02-21 DIAGNOSIS — M4000 Postural kyphosis, site unspecified: Secondary | ICD-10-CM | POA: Diagnosis not present

## 2019-02-21 DIAGNOSIS — E7800 Pure hypercholesterolemia, unspecified: Secondary | ICD-10-CM | POA: Diagnosis not present

## 2019-02-21 DIAGNOSIS — Z87891 Personal history of nicotine dependence: Secondary | ICD-10-CM | POA: Diagnosis not present

## 2019-02-21 DIAGNOSIS — G309 Alzheimer's disease, unspecified: Secondary | ICD-10-CM | POA: Diagnosis not present

## 2019-02-21 DIAGNOSIS — I10 Essential (primary) hypertension: Secondary | ICD-10-CM | POA: Diagnosis not present

## 2019-02-26 DIAGNOSIS — E7800 Pure hypercholesterolemia, unspecified: Secondary | ICD-10-CM | POA: Diagnosis not present

## 2019-02-26 DIAGNOSIS — G309 Alzheimer's disease, unspecified: Secondary | ICD-10-CM | POA: Diagnosis not present

## 2019-02-26 DIAGNOSIS — M4000 Postural kyphosis, site unspecified: Secondary | ICD-10-CM | POA: Diagnosis not present

## 2019-02-26 DIAGNOSIS — I10 Essential (primary) hypertension: Secondary | ICD-10-CM | POA: Diagnosis not present

## 2019-02-26 DIAGNOSIS — Z87891 Personal history of nicotine dependence: Secondary | ICD-10-CM | POA: Diagnosis not present

## 2019-02-28 DIAGNOSIS — G309 Alzheimer's disease, unspecified: Secondary | ICD-10-CM | POA: Diagnosis not present

## 2019-02-28 DIAGNOSIS — Z87891 Personal history of nicotine dependence: Secondary | ICD-10-CM | POA: Diagnosis not present

## 2019-02-28 DIAGNOSIS — E7800 Pure hypercholesterolemia, unspecified: Secondary | ICD-10-CM | POA: Diagnosis not present

## 2019-02-28 DIAGNOSIS — M4000 Postural kyphosis, site unspecified: Secondary | ICD-10-CM | POA: Diagnosis not present

## 2019-02-28 DIAGNOSIS — I10 Essential (primary) hypertension: Secondary | ICD-10-CM | POA: Diagnosis not present

## 2019-03-05 DIAGNOSIS — I10 Essential (primary) hypertension: Secondary | ICD-10-CM | POA: Diagnosis not present

## 2019-03-05 DIAGNOSIS — G309 Alzheimer's disease, unspecified: Secondary | ICD-10-CM | POA: Diagnosis not present

## 2019-03-05 DIAGNOSIS — Z87891 Personal history of nicotine dependence: Secondary | ICD-10-CM | POA: Diagnosis not present

## 2019-03-05 DIAGNOSIS — M4000 Postural kyphosis, site unspecified: Secondary | ICD-10-CM | POA: Diagnosis not present

## 2019-03-05 DIAGNOSIS — E7800 Pure hypercholesterolemia, unspecified: Secondary | ICD-10-CM | POA: Diagnosis not present

## 2019-03-07 DIAGNOSIS — M4000 Postural kyphosis, site unspecified: Secondary | ICD-10-CM | POA: Diagnosis not present

## 2019-03-07 DIAGNOSIS — I10 Essential (primary) hypertension: Secondary | ICD-10-CM | POA: Diagnosis not present

## 2019-03-07 DIAGNOSIS — G309 Alzheimer's disease, unspecified: Secondary | ICD-10-CM | POA: Diagnosis not present

## 2019-03-07 DIAGNOSIS — Z87891 Personal history of nicotine dependence: Secondary | ICD-10-CM | POA: Diagnosis not present

## 2019-03-07 DIAGNOSIS — E7800 Pure hypercholesterolemia, unspecified: Secondary | ICD-10-CM | POA: Diagnosis not present

## 2019-03-11 ENCOUNTER — Telehealth: Payer: Self-pay

## 2019-03-11 NOTE — Telephone Encounter (Signed)
Spoke with Darlene at [place] at [facility] and gave a verbal order from Dr Baird Cancer for the pt to have speech therapy done at home.

## 2019-03-11 NOTE — Telephone Encounter (Signed)
-----   Message from [name], MD sent at 03/06/2019  5:24 PM EST ----- You may give her a verbal.   RS ----- Message ----- From: Michelle Nasuti, [place] Sent: 03/06/2019   3:27 PM EST To: [name], MD  Gregary Signs with [facility] wanted to know if the pt can have a referral for speech therapy because the pt is having issues with swallowing his food. [phone].

## 2019-03-12 DIAGNOSIS — E7800 Pure hypercholesterolemia, unspecified: Secondary | ICD-10-CM | POA: Diagnosis not present

## 2019-03-12 DIAGNOSIS — G309 Alzheimer's disease, unspecified: Secondary | ICD-10-CM | POA: Diagnosis not present

## 2019-03-12 DIAGNOSIS — M4000 Postural kyphosis, site unspecified: Secondary | ICD-10-CM | POA: Diagnosis not present

## 2019-03-12 DIAGNOSIS — Z87891 Personal history of nicotine dependence: Secondary | ICD-10-CM | POA: Diagnosis not present

## 2019-03-12 DIAGNOSIS — I10 Essential (primary) hypertension: Secondary | ICD-10-CM | POA: Diagnosis not present

## 2019-03-15 DIAGNOSIS — Z87891 Personal history of nicotine dependence: Secondary | ICD-10-CM | POA: Diagnosis not present

## 2019-03-15 DIAGNOSIS — I10 Essential (primary) hypertension: Secondary | ICD-10-CM | POA: Diagnosis not present

## 2019-03-15 DIAGNOSIS — M4000 Postural kyphosis, site unspecified: Secondary | ICD-10-CM | POA: Diagnosis not present

## 2019-03-15 DIAGNOSIS — G309 Alzheimer's disease, unspecified: Secondary | ICD-10-CM | POA: Diagnosis not present

## 2019-03-15 DIAGNOSIS — E7800 Pure hypercholesterolemia, unspecified: Secondary | ICD-10-CM | POA: Diagnosis not present

## 2019-03-23 DIAGNOSIS — I10 Essential (primary) hypertension: Secondary | ICD-10-CM | POA: Diagnosis not present

## 2019-03-23 DIAGNOSIS — G309 Alzheimer's disease, unspecified: Secondary | ICD-10-CM | POA: Diagnosis not present

## 2019-03-23 DIAGNOSIS — E7800 Pure hypercholesterolemia, unspecified: Secondary | ICD-10-CM | POA: Diagnosis not present

## 2019-03-23 DIAGNOSIS — Z87891 Personal history of nicotine dependence: Secondary | ICD-10-CM | POA: Diagnosis not present

## 2019-03-23 DIAGNOSIS — M4000 Postural kyphosis, site unspecified: Secondary | ICD-10-CM | POA: Diagnosis not present

## 2019-03-25 ENCOUNTER — Telehealth: Payer: Self-pay

## 2019-03-25 NOTE — Telephone Encounter (Signed)
Verbal ok given to mark to extend home health pt

## 2019-04-04 IMAGING — DX DG CHEST 1V PORT
1 series · 1 of 1 positions shown · non-contrast
Comparison: 05/19/2009 chest radiograph

CLINICAL DATA: Acute fever and cough.

EXAM:
PORTABLE CHEST 1 VIEW

[chest ap]
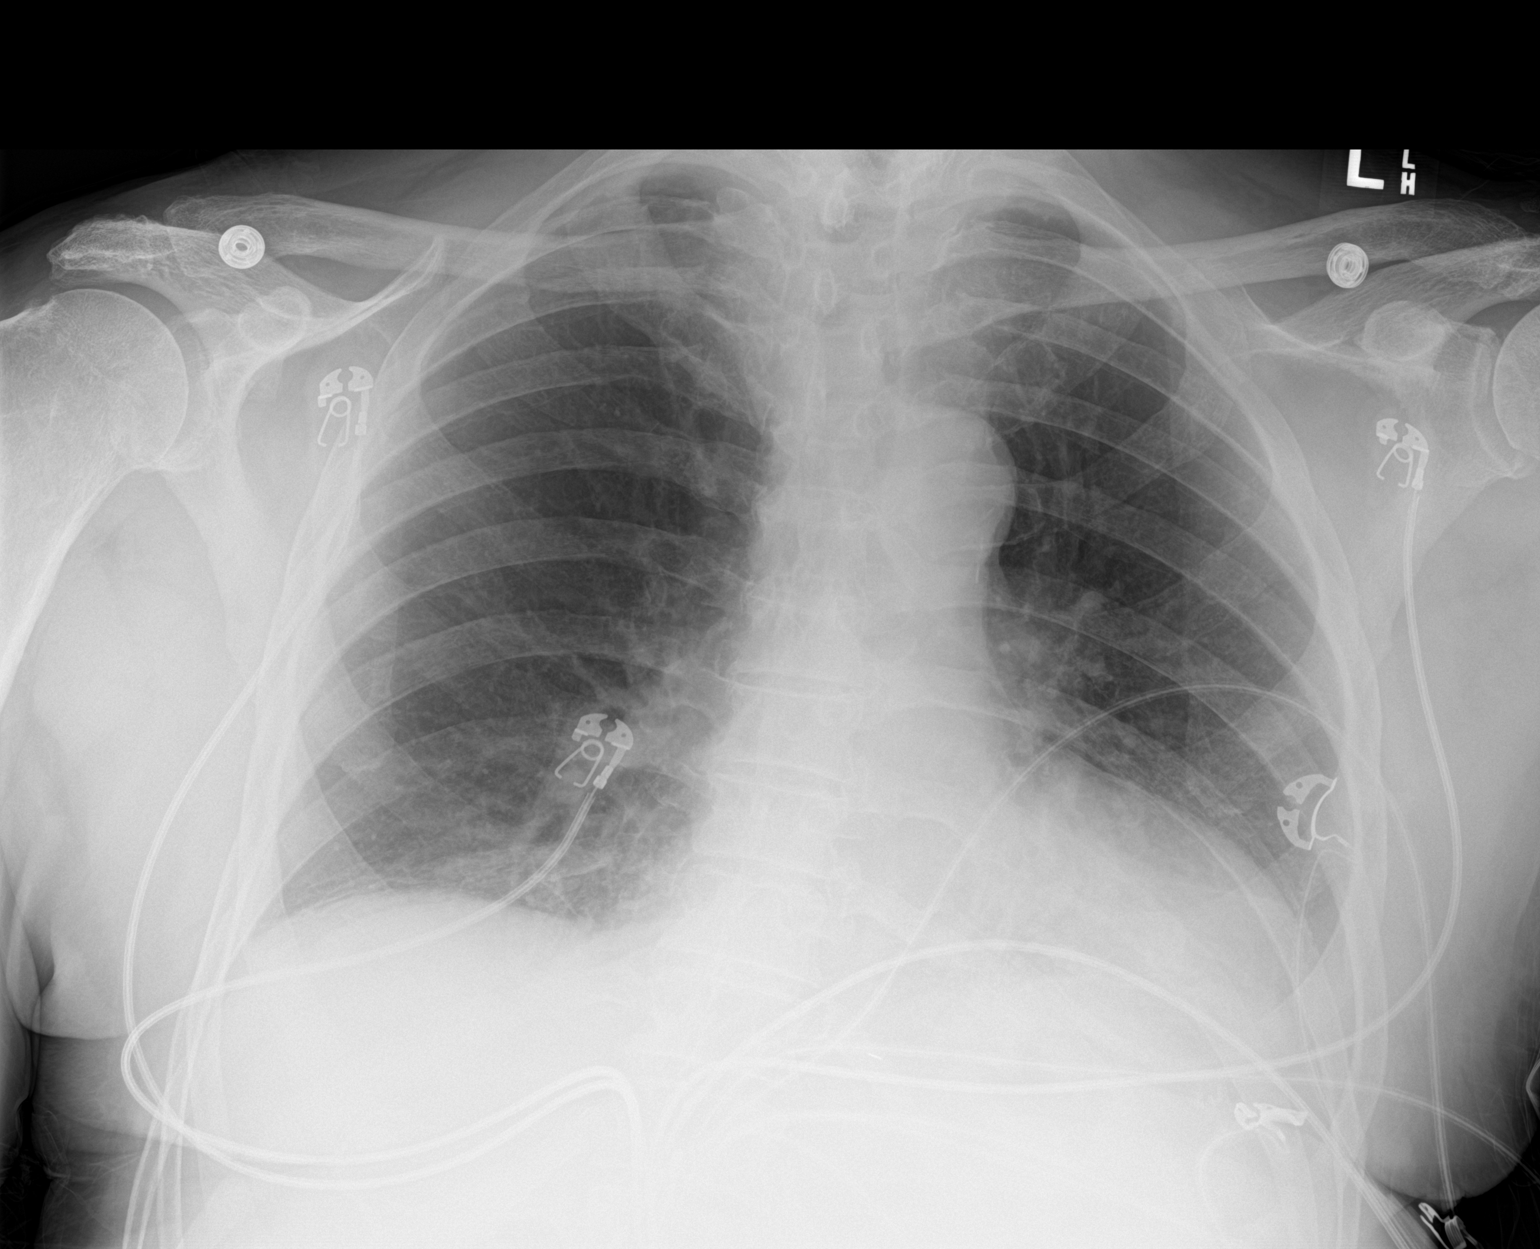

[1 of 1 positions shown; findings below may reference images not displayed]

FINDINGS: The cardiomediastinal silhouette is unremarkable.

Apparent LEFT LOWER lung retrocardiac opacity noted and may
represent airspace disease or atelectasis.

The RIGHT lung is clear.

No pneumothorax or acute bony abnormality.
IMPRESSION: Question LEFT LOWER lung retrocardiac opacity which may represent
airspace disease or atelectasis.

## 2019-04-04 IMAGING — CT CT HEAD W/O CM
2 of 3 series · 14 of 47 positions shown, 17 images · non-contrast
Comparison: 05/19/2009

CLINICAL DATA: Neck pain and fevers

EXAM:
CT HEAD WITHOUT CONTRAST
TECHNIQUE: Contiguous axial images were obtained from the base of the skull
through the vertex without intravenous contrast.

[Series 2: head wo · axial · 0.47mm/px · z∈[-126,-1]mm · 11 of 31 slices shown, 14 images]
[im 3/31  brain]
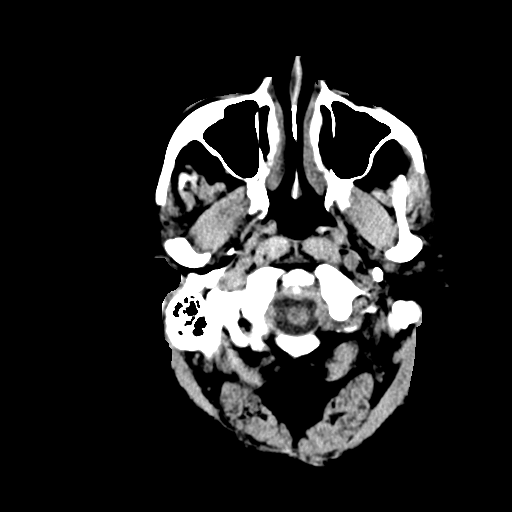
[im 3/31  bone]
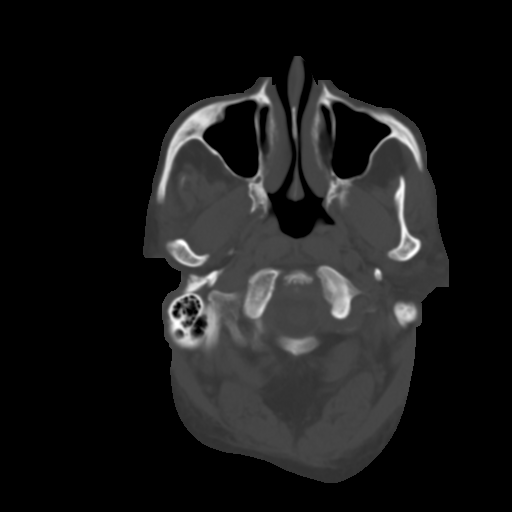
[im 5/31  brain]
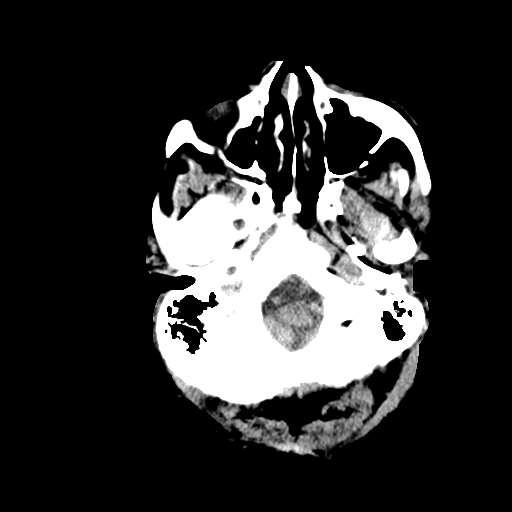
[im 8/31  brain]
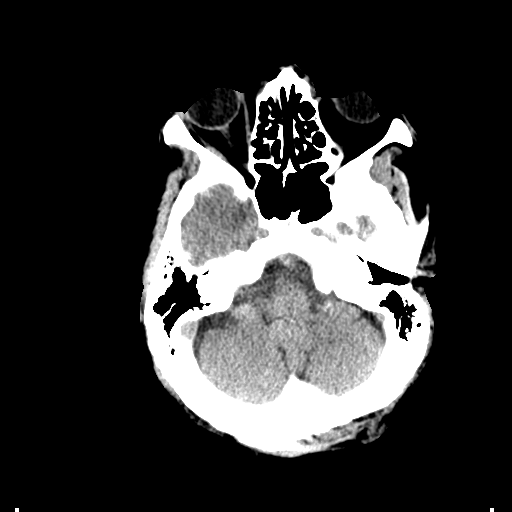
[im 10/31  brain]
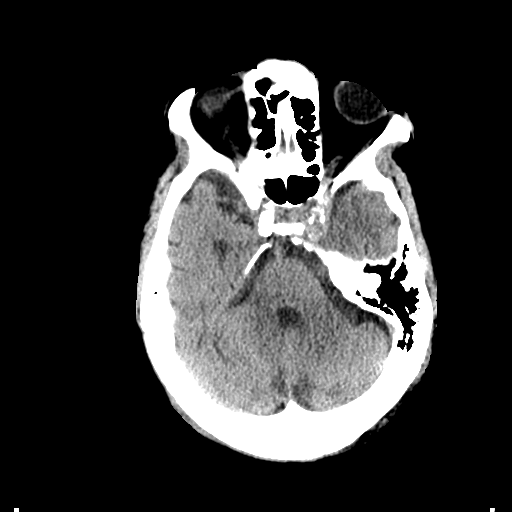
[im 13/31  brain]
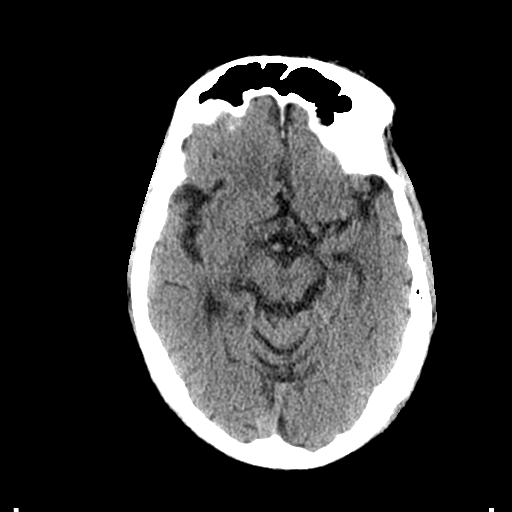
[im 13/31  bone]
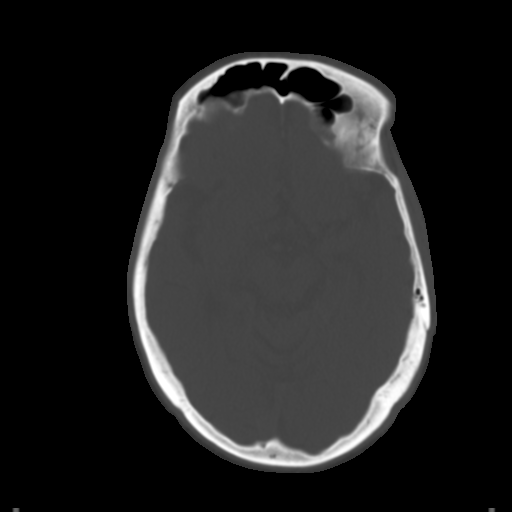
[im 16/31  brain]
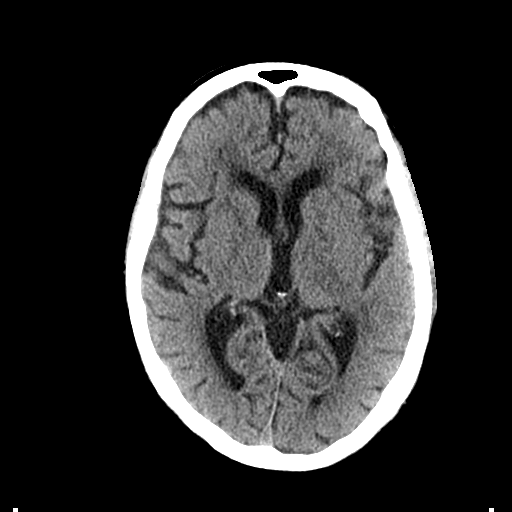
[im 18/31  brain]
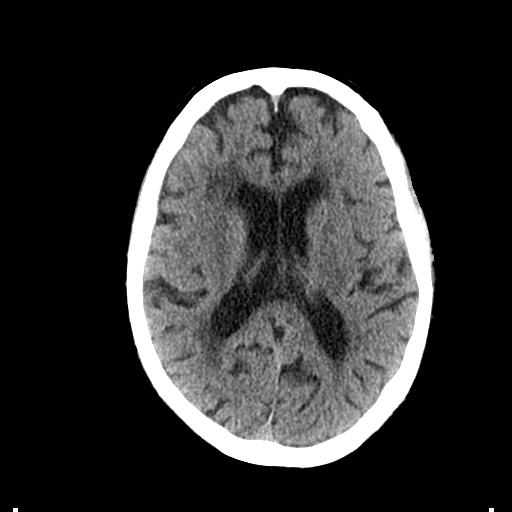
[im 21/31  brain]
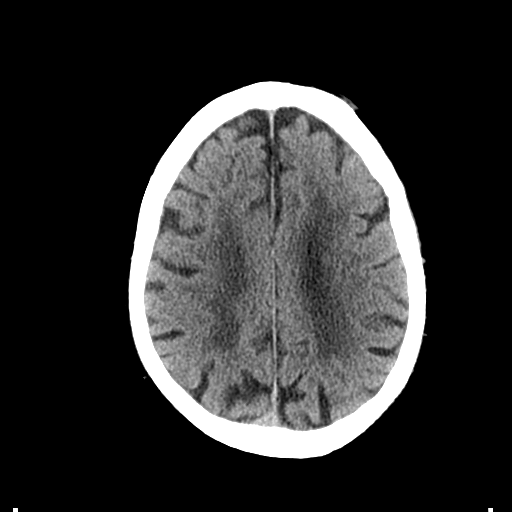
[im 23/31  brain]
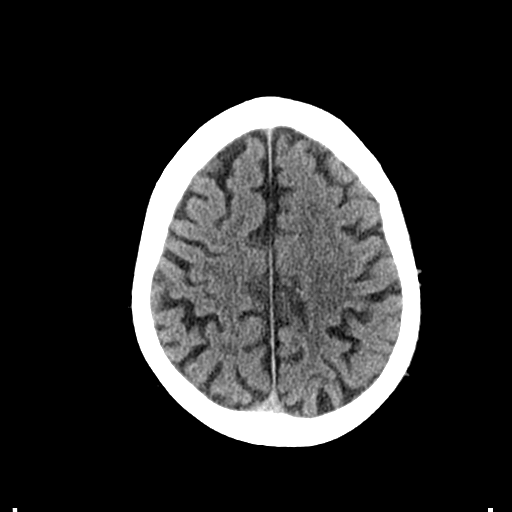
[im 23/31  bone]
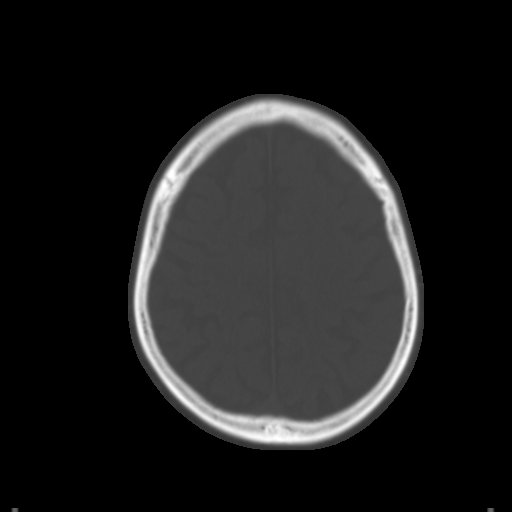
[im 26/31  brain]
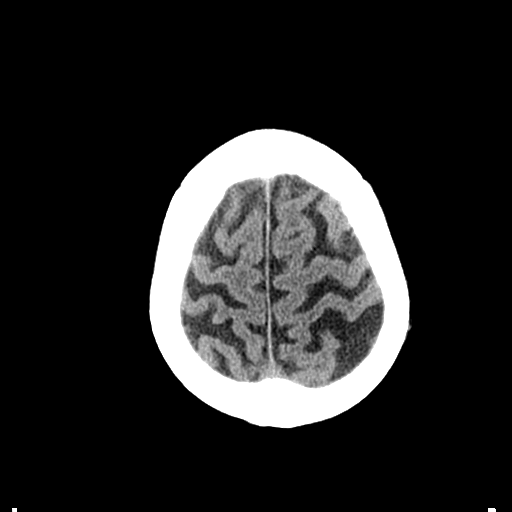
[im 28/31  brain]
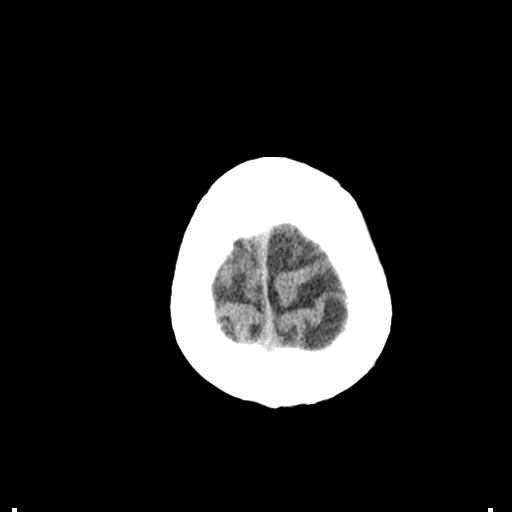

[Series 5: coronal soft tissue · coronal · 0.33mm/px · 3 of 75 slices shown]
[im 25/75  brain]
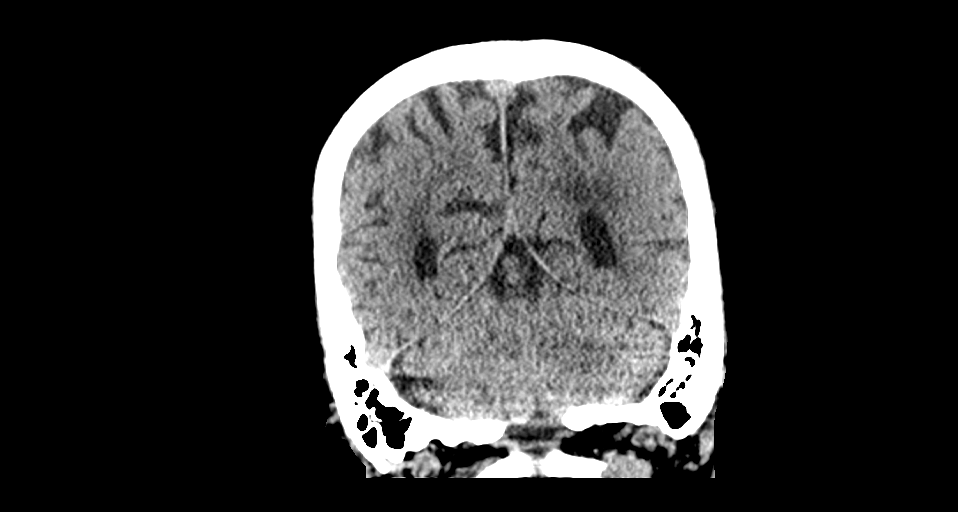
[im 33/75  brain]
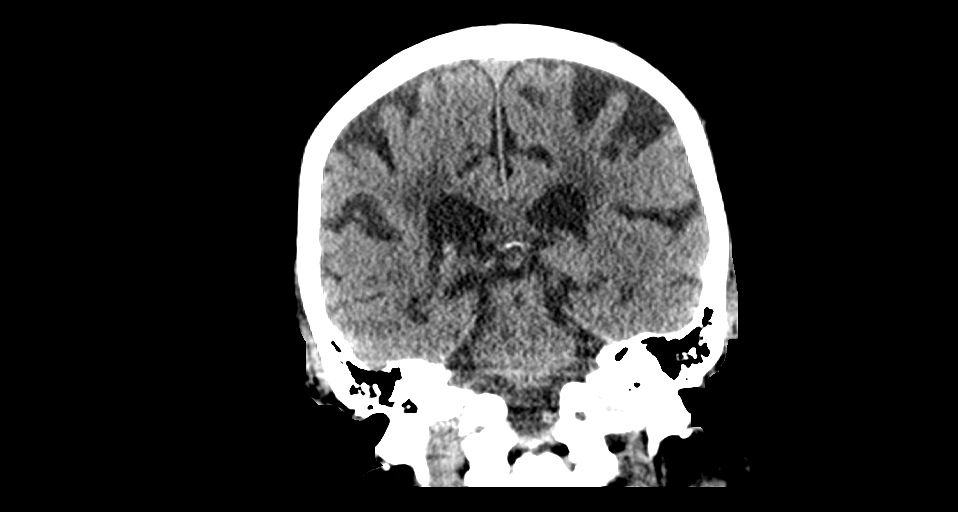
[im 42/75  brain]
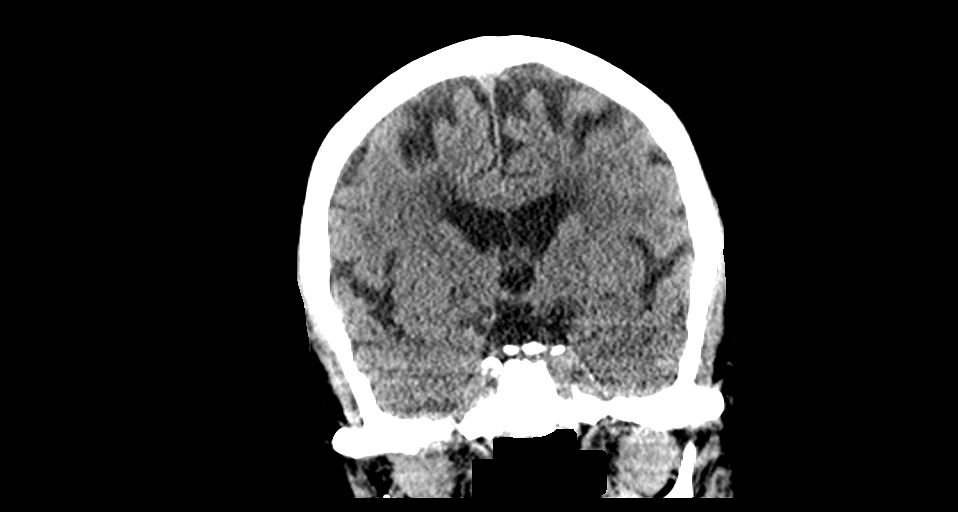

[14 of 47 positions shown; findings below may reference images not displayed]

FINDINGS: Brain: Mild atrophic changes are noted. Chronic white matter
ischemic change is seen. These changes have progressed somewhat in
the interval from the prior exam. No acute hemorrhage, acute
infarction or space-occupying mass lesion is noted.

Vascular: No hyperdense vessel or unexpected calcification.

Skull: Normal. Negative for fracture or focal lesion.

Sinuses/Orbits: No acute finding.

Other: None.
IMPRESSION: Chronic atrophic and ischemic changes without acute abnormality

## 2019-04-13 ENCOUNTER — Other Ambulatory Visit: Payer: Self-pay | Admitting: Internal Medicine

## 2019-04-17 ENCOUNTER — Telehealth: Payer: Self-pay

## 2019-04-30 ENCOUNTER — Ambulatory Visit: Payer: Self-pay

## 2019-04-30 DIAGNOSIS — I10 Essential (primary) hypertension: Secondary | ICD-10-CM

## 2019-04-30 DIAGNOSIS — F0391 Unspecified dementia with behavioral disturbance: Secondary | ICD-10-CM

## 2019-04-30 NOTE — Patient Instructions (Signed)
Social Worker Visit Information  Goals we discussed today:  Goals Addressed            This Visit's Progress   . COMPLETED: "My uncle will start PT again for help with balance and strengthening"       Current Barriers:  . Knowledge Deficits related to impaired physical mobility and gait disturbance  Nurse Case Manager Clinical Goal(s):  . Over the next 60 days, patient will work with in home PT to address needs related to impaired gait and strengthening  CCM SW Interventions Completed 04/30/2019 . Outbound call placed to the patients sister [name] Barnes o Confirmed the patient did receive in home PT as previously desired  o No recent falls . Goal Met  CCM RN CM Interventions:  01/01/19 call completed with sister [name] Barnes and niece Toni Pickard  . Evaluation of current treatment plan related to impaired physical mobility/gait disturbance and patient's adherence to plan as established by provider. . Advised patient to contact the CCM team if care coordination is needed for DME; discussed the family should notify Dr. Sanders and or the CCM team of any/all falls that may occur  . Reviewed medications with patient and discussed indication, dosage and frequency of Namenda newly prescribed by established Neurologist  . Discussed plans with patient for ongoing care management follow up and provided patient with direct contact information for care management team  Patient Self Care Activities with assistance from sister and niece  . Attends all scheduled provider appointments . Performs ADL's independently . Performs IADL's independently . Calls provider office for new concerns or questions  Please see past updates related to this goal by clicking on the "Past Updates" button in the selected goal      . Assist with ongoing care management and care coordination needs       Current Barriers:  . Ongoing chronic conditions including HTN and Dementia . Limited ability to manage own  care coordination needs due to limitations from dementia  Social Work Clinical Goal(s):  . Over the next 120 days the patient will work with CM team to address ongoing care management concerns.  CCM SW Interventions: . Outbound call placed to the patients sister and caregiver [name] Barnes . Unable to identify acute needs at this time . Discussed ongoing support available from care management team . Scheduled follow up call with RN Case Manager over the next 90 days  Patient Self Care Activities:  . Attends all scheduled provider appointments . Calls provider office for new concerns or questions . Patients sister verbalizes understanding to contact CM team with future care management needs  Initial goal documentation         Follow Up Plan: A member of the care management team will contact the patient over the next 90 days   , BSW, CDP Social Worker, Certified Dementia Practitioner [facility] [phone]        

## 2019-04-30 NOTE — Chronic Care Management (AMB) (Signed)
Chronic Care Management   Social Work Follow Up Note  04/30/2019 Name: Manuel [name] MRN: [id number] DOB: 05-02-1932  Manuel [name] is a 86 y.o. year old male who is a primary care patient of [name], MD. The CCM team was consulted for assistance with care coordination.   Review of patient status, including review of consultants reports, other relevant assessments, and collaboration with appropriate care team members and the patient's provider was performed as part of comprehensive patient evaluation and provision of chronic care management services.    SW placed an outbound call to the patients sister and caregiver Manuel [name] to assist with care coordination.  Outpatient Encounter Medications as of 04/30/2019  Medication Sig  . acetaminophen (TYLENOL) 500 MG tablet Take 1,000 mg by mouth every 6 (six) hours as needed for moderate pain.  [name] amLODipine (NORVASC) 5 MG tablet TAKE 1 TABLET BY MOUTH EVERY DAY  . aspirin EC 81 MG tablet Take 81 mg by mouth every evening.   . Calcium Carbonate Antacid (CALCIUM CARBONATE PO) Take 100 mg by mouth. 3 x weekly  . cholecalciferol (VITAMIN D3) 25 MCG (1000 UT) tablet Take 2,000 Units by mouth every evening.  . [employer] MELATONIN 3 MG TABS TAKE 1 TABLET (3 MG TOTAL) BY MOUTH AT BEDTIME.  . cyclobenzaprine (FLEXERIL) 5 MG tablet One tablet po qhs prn (Patient not taking: Reported on 11/26/2018)  . losartan-hydrochlorothiazide (HYZAAR) 100-25 MG tablet TAKE 1 TABLET BY MOUTH EVERY DAY  . memantine (NAMENDA) 10 MG tablet TAKE 1 TABLET BY MOUTH TWICE A DAY  . tamsulosin (FLOMAX) 0.4 MG CAPS capsule TAKE 1 CAPSULE BY MOUTH EVERY DAY  . traMADol (ULTRAM) 50 MG tablet ONE NIGHTLY AS NEEDED FOR PAIN (Patient not taking: Reported on 11/26/2018)  . vitamin E 200 UNIT capsule Take 200 Units by mouth every evening.   No facility-administered encounter medications on file as of 04/30/2019.     Goals Addressed            This Visit's Progress   .  COMPLETED: "My uncle will start PT again for help with balance and strengthening"       Current Barriers:  [name] Knowledge Deficits related to impaired physical mobility and gait disturbance  Nurse Case Manager Clinical Goal(s):  [name] Over the next 60 days, patient will work with in home PT to address needs related to impaired gait and strengthening  CCM SW Interventions Completed 04/30/2019 . Outbound call placed to the patients sister Manuel [name] o Confirmed the patient did receive in home PT as previously desired  o No recent falls . Goal Met  CCM RN CM Interventions:  01/01/19 call completed with sister Manuel [name] and niece Manuel [name]  . Evaluation of current treatment plan related to impaired physical mobility/gait disturbance and patient's adherence to plan as established by provider. . Advised patient to contact the CCM team if care coordination is needed for DME; discussed the family should notify Dr. Baird Cancer and or the CCM team of any/all falls that may occur  . Reviewed medications with patient and discussed indication, dosage and frequency of Namenda newly prescribed by established Neurologist  . Discussed plans with patient for ongoing care management follow up and provided patient with direct contact information for care management team  Patient Self Care Activities with assistance from sister and niece  . Attends all scheduled provider appointments . Performs ADL's independently . Performs IADL's independently . Calls provider office for new concerns or questions  Please see past updates related to this goal by clicking on the "Past Updates" button in the selected goal      . Assist with ongoing care management and care coordination needs       Current Barriers:  . Ongoing chronic conditions including HTN and Dementia . Limited ability to manage own care coordination needs due to limitations from dementia  Social Work Clinical Goal(s):  [name] Over the next 120 days the  patient will work with CM team to address ongoing care management concerns.  CCM SW Interventions: . Outbound call placed to the patients sister and caregiver Manuel [name] . Unable to identify acute needs at this time . Discussed ongoing support available from care management team . Scheduled follow up call with RN Case Manager over the next 90 days  Patient Self Care Activities:  . Attends all scheduled provider appointments . Calls provider office for new concerns or questions . Patients sister verbalizes understanding to contact CM team with future care management needs  Initial goal documentation         Follow Up Plan: A member of the care management team will contact the patient over the next 90 days.   [name], BSW, CDP Social Worker, Certified Dementia Practitioner [place] [facility] [phone]  Total time spent performing care coordination and/or care management activities with the patient by phone or face to face = 8 minutes.

## 2019-05-13 DIAGNOSIS — Z9181 History of falling: Secondary | ICD-10-CM | POA: Diagnosis not present

## 2019-05-13 DIAGNOSIS — I10 Essential (primary) hypertension: Secondary | ICD-10-CM | POA: Diagnosis not present

## 2019-05-13 DIAGNOSIS — F0390 Unspecified dementia without behavioral disturbance: Secondary | ICD-10-CM | POA: Diagnosis not present

## 2019-05-13 DIAGNOSIS — M545 Low back pain: Secondary | ICD-10-CM | POA: Diagnosis not present

## 2019-05-17 DIAGNOSIS — I10 Essential (primary) hypertension: Secondary | ICD-10-CM | POA: Diagnosis not present

## 2019-05-17 DIAGNOSIS — M545 Low back pain: Secondary | ICD-10-CM | POA: Diagnosis not present

## 2019-05-17 DIAGNOSIS — Z9181 History of falling: Secondary | ICD-10-CM | POA: Diagnosis not present

## 2019-05-21 DIAGNOSIS — Z9181 History of falling: Secondary | ICD-10-CM | POA: Diagnosis not present

## 2019-05-21 DIAGNOSIS — M545 Low back pain: Secondary | ICD-10-CM | POA: Diagnosis not present

## 2019-05-21 DIAGNOSIS — I10 Essential (primary) hypertension: Secondary | ICD-10-CM | POA: Diagnosis not present

## 2019-05-24 DIAGNOSIS — M545 Low back pain: Secondary | ICD-10-CM | POA: Diagnosis not present

## 2019-05-24 DIAGNOSIS — Z9181 History of falling: Secondary | ICD-10-CM | POA: Diagnosis not present

## 2019-05-24 DIAGNOSIS — I10 Essential (primary) hypertension: Secondary | ICD-10-CM | POA: Diagnosis not present

## 2019-05-28 DIAGNOSIS — M545 Low back pain: Secondary | ICD-10-CM | POA: Diagnosis not present

## 2019-05-28 DIAGNOSIS — Z9181 History of falling: Secondary | ICD-10-CM | POA: Diagnosis not present

## 2019-05-28 DIAGNOSIS — I10 Essential (primary) hypertension: Secondary | ICD-10-CM | POA: Diagnosis not present

## 2019-05-31 DIAGNOSIS — I10 Essential (primary) hypertension: Secondary | ICD-10-CM | POA: Diagnosis not present

## 2019-05-31 DIAGNOSIS — M545 Low back pain: Secondary | ICD-10-CM | POA: Diagnosis not present

## 2019-05-31 DIAGNOSIS — Z9181 History of falling: Secondary | ICD-10-CM | POA: Diagnosis not present

## 2019-06-03 DIAGNOSIS — I10 Essential (primary) hypertension: Secondary | ICD-10-CM | POA: Diagnosis not present

## 2019-06-03 DIAGNOSIS — Z9181 History of falling: Secondary | ICD-10-CM | POA: Diagnosis not present

## 2019-06-03 DIAGNOSIS — M545 Low back pain: Secondary | ICD-10-CM | POA: Diagnosis not present

## 2019-06-06 DIAGNOSIS — I10 Essential (primary) hypertension: Secondary | ICD-10-CM | POA: Diagnosis not present

## 2019-06-06 DIAGNOSIS — Z9181 History of falling: Secondary | ICD-10-CM | POA: Diagnosis not present

## 2019-06-06 DIAGNOSIS — M545 Low back pain: Secondary | ICD-10-CM | POA: Diagnosis not present

## 2019-06-10 DIAGNOSIS — I10 Essential (primary) hypertension: Secondary | ICD-10-CM | POA: Diagnosis not present

## 2019-06-10 DIAGNOSIS — Z9181 History of falling: Secondary | ICD-10-CM | POA: Diagnosis not present

## 2019-06-10 DIAGNOSIS — M545 Low back pain: Secondary | ICD-10-CM | POA: Diagnosis not present

## 2019-06-12 DIAGNOSIS — I10 Essential (primary) hypertension: Secondary | ICD-10-CM | POA: Diagnosis not present

## 2019-06-12 DIAGNOSIS — Z9181 History of falling: Secondary | ICD-10-CM | POA: Diagnosis not present

## 2019-06-12 DIAGNOSIS — M545 Low back pain: Secondary | ICD-10-CM | POA: Diagnosis not present

## 2019-06-17 DIAGNOSIS — M545 Low back pain: Secondary | ICD-10-CM | POA: Diagnosis not present

## 2019-06-17 DIAGNOSIS — I10 Essential (primary) hypertension: Secondary | ICD-10-CM | POA: Diagnosis not present

## 2019-06-17 DIAGNOSIS — Z9181 History of falling: Secondary | ICD-10-CM | POA: Diagnosis not present

## 2019-06-19 ENCOUNTER — Telehealth: Payer: Self-pay

## 2019-06-19 NOTE — Telephone Encounter (Signed)
Darlene with [facility] called and said that the pt would need new face to face notes for his physical therapy and a new referral if his provider feels that he still needs the physical therapy because the pt's daughter scheduled his [facility] out side of the date allowed for his face to face notes.

## 2019-06-21 ENCOUNTER — Ambulatory Visit: Payer: Medicare Other | Attending: Internal Medicine

## 2019-06-21 DIAGNOSIS — Z23 Encounter for immunization: Secondary | ICD-10-CM

## 2019-06-21 NOTE — Progress Notes (Signed)
   Covid-19 Vaccination Clinic  Name:  KEIGEN CADDELL    MRN: [id number] DOB: [birth year]  06/21/2019  Mr. Mcbee was observed post Covid-19 immunization for 15 minutes without incidence. He was provided with Vaccine Information Sheet and instruction to access the V-Safe system.   Mr. Ciavarella was instructed to call 911 with any severe reactions post vaccine: [name] Difficulty breathing  . Swelling of your face and throat  . A fast heartbeat  . A bad rash all over your body  . Dizziness and weakness    Immunizations Administered    Name Date Dose VIS Date Route   Pfizer COVID-19 Vaccine 06/21/2019  2:24 PM 0.3 mL 04/05/2019 Intramuscular   Manufacturer: [employer], [employer]   Lot: [id number]   NDC: [address]-5

## 2019-06-25 ENCOUNTER — Ambulatory Visit (INDEPENDENT_AMBULATORY_CARE_PROVIDER_SITE_OTHER): Payer: Medicare Other | Admitting: Internal Medicine

## 2019-06-25 ENCOUNTER — Other Ambulatory Visit: Payer: Self-pay

## 2019-06-25 ENCOUNTER — Encounter: Payer: Self-pay | Admitting: Internal Medicine

## 2019-06-25 VITALS — BP 130/86 | HR 88 | Temp 97.4°F | Ht 67.0 in | Wt 174.0 lb

## 2019-06-25 DIAGNOSIS — R269 Unspecified abnormalities of gait and mobility: Secondary | ICD-10-CM | POA: Diagnosis not present

## 2019-06-25 DIAGNOSIS — N183 Chronic kidney disease, stage 3 unspecified: Secondary | ICD-10-CM | POA: Diagnosis not present

## 2019-06-25 DIAGNOSIS — I129 Hypertensive chronic kidney disease with stage 1 through stage 4 chronic kidney disease, or unspecified chronic kidney disease: Secondary | ICD-10-CM

## 2019-06-25 DIAGNOSIS — N3281 Overactive bladder: Secondary | ICD-10-CM | POA: Diagnosis not present

## 2019-06-25 NOTE — Patient Instructions (Signed)
Exercises To Do While Sitting  Exercises that you do while sitting (chair exercises) can give you many of the same benefits as full exercise. Benefits include strengthening your heart, burning calories, and keeping muscles and joints healthy. Exercise can also improve your mood and help with depression and anxiety. You may benefit from chair exercises if you are unable to do standing exercises because of:  Diabetic foot pain.  Obesity.  Illness.  Arthritis.  Recovery from surgery or injury.  Breathing problems.  Balance problems.  Another type of disability. Before starting chair exercises, check with your health care provider or a physical therapist to find out how much exercise you can tolerate and which exercises are safe for you. If your health care provider approves:  Start out slowly and build up over time. Aim to work up to about 10-20 minutes for each exercise session.  Make exercise part of your daily routine.  Drink water when you exercise. Do not wait until you are thirsty. Drink every 10-15 minutes.  Stop exercising right away if you have pain, nausea, shortness of breath, or dizziness.  If you are exercising in a wheelchair, make sure to lock the wheels.  Ask your health care provider whether you can do tai chi or yoga. Many positions in these mind-body exercises can be modified to do while seated. Warm-up Before starting other exercises: 1. Sit up as straight as you can. Have your knees bent at 90 degrees, which is the shape of the capital letter "L." Keep your feet flat on the floor. 2. Sit at the front edge of your chair, if you can. 3. Pull in (tighten) the muscles in your abdomen and stretch your spine and neck as straight as you can. Hold this position for a few minutes. 4. Breathe in and out evenly. Try to concentrate on your breathing, and relax your mind. Stretching Exercise A: Arm stretch 1. Hold your arms out straight in front of your body. 2. Bend  your hands at the wrist with your fingers pointing up, as if signaling someone to stop. Notice the slight tension in your forearms as you hold the position. 3. Keeping your arms out and your hands bent, rotate your hands outward as far as you can and hold this stretch. Aim to have your thumbs pointing up and your pinkie fingers pointing down. Slowly repeat arm stretches for one minute as tolerated. Exercise B: Leg stretch 1. If you can move your legs, try to "draw" letters on the floor with the toes of your foot. Write your name with one foot. 2. Write your name with the toes of your other foot. Slowly repeat the movements for one minute as tolerated. Exercise C: Reach for the sky 1. Reach your hands as far over your head as you can to stretch your spine. 2. Move your hands and arms as if you are climbing a rope. Slowly repeat the movements for one minute as tolerated. Range of motion exercises Exercise A: Shoulder roll 1. Let your arms hang loosely at your sides. 2. Lift just your shoulders up toward your ears, then let them relax back down. 3. When your shoulders feel loose, rotate your shoulders in backward and forward circles. Do shoulder rolls slowly for one minute as tolerated. Exercise B: March in place 1. As if you are marching, pump your arms and lift your legs up and down. Lift your knees as high as you can. ? If you are unable to lift your knees,  just pump your arms and move your ankles and feet up and down. March in place for one minute as tolerated. Exercise C: Seated jumping jacks 1. Let your arms hang down straight. 2. Keeping your arms straight, lift them up over your head. Aim to point your fingers to the ceiling. 3. While you lift your arms, straighten your legs and slide your heels along the floor to your sides, as wide as you can. 4. As you bring your arms back down to your sides, slide your legs back together. ? If you are unable to use your legs, just move your  arms. Slowly repeat seated jumping jacks for one minute as tolerated. Strengthening exercises Exercise A: Shoulder squeeze 1. Hold your arms straight out from your body to your sides, with your elbows bent and your fists pointed at the ceiling. 2. Keeping your arms in the bent position, move them forward so your elbows and forearms meet in front of your face. 3. Open your arms back out as wide as you can with your elbows still bent, until you feel your shoulder blades squeezing together. Hold for 5 seconds. Slowly repeat the movements forward and backward for one minute as tolerated. Contact a health care provider if you:  Had to stop exercising due to any of the following: ? Pain. ? Nausea. ? Shortness of breath. ? Dizziness. ? Fatigue.  Have significant pain or soreness after exercising. Get help right away if you have:  Chest pain.  Difficulty breathing. These symptoms may represent a serious problem that is an emergency. Do not wait to see if the symptoms will go away. Get medical help right away. Call your local emergency services (911 in the [place] Do not drive yourself to the hospital. This information is not intended to replace advice given to you by your health care provider. Make sure you discuss any questions you have with your health care provider. Document Revised: 08/02/2018 Document Reviewed: 02/22/2017 Elsevier Patient Education  2020 [employer].

## 2019-06-26 LAB — CMP14+EGFR
ALT: 11 IU/L (ref 0–44)
AST: 16 IU/L (ref 0–40)
Albumin/Globulin Ratio: 1.2 (ref 1.2–2.2)
Albumin: 3.9 g/dL (ref 3.6–4.6)
Alkaline Phosphatase: 85 IU/L (ref 39–117)
BUN/Creatinine Ratio: 12 (ref 10–24)
BUN: 14 mg/dL (ref 8–27)
Bilirubin Total: <0.2 mg/dL (ref 0.0–1.2)
CO2: 25 mmol/L (ref 20–29)
Calcium: 9.3 mg/dL (ref 8.6–10.2)
Chloride: 92 mmol/L — ABNORMAL LOW (ref 96–106)
Creatinine, Ser: 1.2 mg/dL (ref 0.76–1.27)
GFR calc Af Amer: 64 mL/min/{1.73_m2} (ref >59)
GFR calc non Af Amer: 55 mL/min/{1.73_m2} — ABNORMAL LOW (ref >59)
Globulin, Total: 3.2 g/dL (ref 1.5–4.5)
Glucose: 92 mg/dL (ref 65–99)
Potassium: 3.3 mmol/L — ABNORMAL LOW (ref 3.5–5.2)
Sodium: 130 mmol/L — ABNORMAL LOW (ref 134–144)
Total Protein: 7.1 g/dL (ref 6.0–8.5)

## 2019-06-26 LAB — TSH: TSH: 2.3 u[IU]/mL (ref 0.450–4.500)

## 2019-06-30 NOTE — Progress Notes (Signed)
This visit occurred during the SARS-CoV-2 public health emergency.  Safety protocols were in place, including screening questions prior to the visit, additional usage of staff PPE, and extensive cleaning of exam room while observing appropriate contact time as indicated for disinfecting solutions.  Subjective:     Patient ID: Manuel [name] , male    DOB: 05-Aug-1932 , 86 y.o.   MRN: [id number]   Chief Complaint  Patient presents with  . Hypertension    BP Fu    HPI  Hypertension This is a chronic problem. The current episode started more than 1 year ago. The problem has been gradually improving since onset. The problem is controlled. Pertinent negatives include no blurred vision, chest pain, palpitations or shortness of breath. Risk factors for coronary artery disease include male gender. Past treatments include angiotensin blockers and diuretics. The current treatment provides moderate improvement. Compliance problems include exercise.  Hypertensive end-organ damage includes kidney disease.     Past Medical History:  Diagnosis Date  . Alzheimer's dementia ([place])   . Hypertension   . Prostate disorder      Family History  Problem Relation Age of Onset  . Early death Mother   . Prostate cancer Father      Current Outpatient Medications:  .  acetaminophen (TYLENOL) 500 MG tablet, Take 1,000 mg by mouth every 6 (six) hours as needed for moderate pain., Disp: , Rfl:  .  amLODipine (NORVASC) 5 MG tablet, TAKE 1 TABLET BY MOUTH EVERY DAY, Disp: 90 tablet, Rfl: 3 .  Ascorbic Acid (VITAMIN C PO), Take 100 mg by mouth daily., Disp: , Rfl:  .  aspirin EC 81 MG tablet, Take 81 mg by mouth every evening. , Disp: , Rfl:  .  Calcium Carbonate Antacid (CALCIUM CARBONATE PO), Take 100 mg by mouth. 3 x weekly, Disp: , Rfl:  .  cholecalciferol (VITAMIN D3) 25 MCG (1000 UT) tablet, Take 2,000 Units by mouth every evening., Disp: , Rfl:  .  diphenhydrAMINE HCl, Sleep, (ZZZQUIL PO), Take 1  tablet by mouth at bedtime as needed., Disp: , Rfl:  .  losartan-hydrochlorothiazide (HYZAAR) 100-25 MG tablet, TAKE 1 TABLET BY MOUTH EVERY DAY, Disp: 90 tablet, Rfl: 0 .  memantine (NAMENDA) 10 MG tablet, TAKE 1 TABLET BY MOUTH TWICE A DAY, Disp: 180 tablet, Rfl: 3 .  tamsulosin (FLOMAX) 0.4 MG CAPS capsule, TAKE 1 CAPSULE BY MOUTH EVERY DAY, Disp: 90 capsule, Rfl: 1 .  vitamin E 200 UNIT capsule, Take 200 Units by mouth every evening., Disp: , Rfl:    Allergies  Allergen Reactions  . Penicillins Swelling    Did it involve swelling of the face/tongue/throat, SOB, or low BP? Yes Did it involve sudden or severe rash/hives, skin peeling, or any reaction on the inside of your mouth or nose? No Did you need to seek medical attention at a hospital or doctor's office? No When did it last happen?2013 If all above answers are "NO", may proceed with cephalosporin use.      Review of Systems  Constitutional: Negative.   Eyes: Negative for blurred vision.  Respiratory: Negative.  Negative for shortness of breath.   Cardiovascular: Negative.  Negative for chest pain and palpitations.  Gastrointestinal: Negative.   Neurological: Negative.   Psychiatric/Behavioral: Negative.      Today's Vitals   06/25/19 1500  BP: 130/86  Pulse: 88  Temp: (!) 97.4 F (36.3 C)  Weight: 174 lb (78.9 kg)  Height: 5' 7" (1.702 m)  Body mass index is 27.25 kg/m.   Objective:  Physical Exam Vitals and nursing note reviewed.  Constitutional:      Appearance: Normal appearance.  Cardiovascular:     Rate and Rhythm: Normal rate and regular rhythm.     Heart sounds: Normal heart sounds.  Pulmonary:     Effort: Pulmonary effort is normal.     Breath sounds: Normal breath sounds.  Musculoskeletal:     Comments: kyphosis  Skin:    General: Skin is warm.  Neurological:     General: No focal deficit present.     Mental Status: He is alert.  Psychiatric:        Mood and Affect: Mood normal.          Assessment And Plan:     1. Parenchymal renal hypertension, stage 1 through stage 4 or unspecified chronic kidney disease  Chronic, controlled. He will continue with current meds. He is encouraged to stay well hydrated and take meds as directed.  I will check renal function today.   - CMP14+EGFR - TSH  2. Stage 3 chronic kidney disease, unspecified whether stage 3a or 3b CKD  Chronic, advised to stay well hydrated.   3. Gait abnormality  I will refer him to [facility] as requested. He was also given exercises to perform at home while seated.   - Ambulatory referral to [facility]  4. OAB (overactive bladder)  Chronic. He will continue with current meds.   [name], MD    THE PATIENT IS ENCOURAGED TO PRACTICE SOCIAL DISTANCING DUE TO THE COVID-19 PANDEMIC.

## 2019-07-08 DIAGNOSIS — G309 Alzheimer's disease, unspecified: Secondary | ICD-10-CM | POA: Diagnosis not present

## 2019-07-08 DIAGNOSIS — Z87891 Personal history of nicotine dependence: Secondary | ICD-10-CM

## 2019-07-08 DIAGNOSIS — Z7982 Long term (current) use of aspirin: Secondary | ICD-10-CM

## 2019-07-08 DIAGNOSIS — N3281 Overactive bladder: Secondary | ICD-10-CM

## 2019-07-08 DIAGNOSIS — N400 Enlarged prostate without lower urinary tract symptoms: Secondary | ICD-10-CM

## 2019-07-08 DIAGNOSIS — I129 Hypertensive chronic kidney disease with stage 1 through stage 4 chronic kidney disease, or unspecified chronic kidney disease: Secondary | ICD-10-CM | POA: Diagnosis not present

## 2019-07-08 DIAGNOSIS — N183 Chronic kidney disease, stage 3 unspecified: Secondary | ICD-10-CM | POA: Diagnosis not present

## 2019-07-08 DIAGNOSIS — F0281 Dementia in other diseases classified elsewhere with behavioral disturbance: Secondary | ICD-10-CM | POA: Diagnosis not present

## 2019-07-08 DIAGNOSIS — E7800 Pure hypercholesterolemia, unspecified: Secondary | ICD-10-CM

## 2019-07-09 ENCOUNTER — Other Ambulatory Visit: Payer: Self-pay | Admitting: Internal Medicine

## 2019-07-10 ENCOUNTER — Other Ambulatory Visit: Payer: Self-pay | Admitting: Internal Medicine

## 2019-07-11 ENCOUNTER — Other Ambulatory Visit: Payer: Self-pay | Admitting: Internal Medicine

## 2019-07-16 DIAGNOSIS — Z87891 Personal history of nicotine dependence: Secondary | ICD-10-CM | POA: Diagnosis not present

## 2019-07-16 DIAGNOSIS — I129 Hypertensive chronic kidney disease with stage 1 through stage 4 chronic kidney disease, or unspecified chronic kidney disease: Secondary | ICD-10-CM | POA: Diagnosis not present

## 2019-07-16 DIAGNOSIS — N183 Chronic kidney disease, stage 3 unspecified: Secondary | ICD-10-CM | POA: Diagnosis not present

## 2019-07-16 DIAGNOSIS — Z7982 Long term (current) use of aspirin: Secondary | ICD-10-CM | POA: Diagnosis not present

## 2019-07-16 DIAGNOSIS — E7800 Pure hypercholesterolemia, unspecified: Secondary | ICD-10-CM | POA: Diagnosis not present

## 2019-07-16 DIAGNOSIS — N3281 Overactive bladder: Secondary | ICD-10-CM | POA: Diagnosis not present

## 2019-07-16 DIAGNOSIS — G309 Alzheimer's disease, unspecified: Secondary | ICD-10-CM | POA: Diagnosis not present

## 2019-07-17 ENCOUNTER — Ambulatory Visit: Payer: Medicare Other | Attending: Internal Medicine

## 2019-07-17 DIAGNOSIS — Z23 Encounter for immunization: Secondary | ICD-10-CM

## 2019-07-17 NOTE — Progress Notes (Signed)
   Covid-19 Vaccination Clinic  Name:  Manuel [name]    MRN: [id number] DOB: [birth year]  07/17/2019  Manuel [name] was observed post Covid-19 immunization for 15 minutes without incident. He was provided with Vaccine Information Sheet and instruction to access the V-Safe system.   Manuel [name] was instructed to call 911 with any severe reactions post vaccine: [name] Difficulty breathing  . Swelling of face and throat  . A fast heartbeat  . A bad rash all over body  . Dizziness and weakness   Immunizations Administered    Name Date Dose VIS Date Route   Pfizer COVID-19 Vaccine 07/17/2019  8:38 AM 0.3 mL 04/05/2019 Intramuscular   Manufacturer: [employer], [employer]   Lot: [id number]   NDC: [address]-6

## 2019-07-19 DIAGNOSIS — N3281 Overactive bladder: Secondary | ICD-10-CM | POA: Diagnosis not present

## 2019-07-19 DIAGNOSIS — Z7982 Long term (current) use of aspirin: Secondary | ICD-10-CM | POA: Diagnosis not present

## 2019-07-19 DIAGNOSIS — I129 Hypertensive chronic kidney disease with stage 1 through stage 4 chronic kidney disease, or unspecified chronic kidney disease: Secondary | ICD-10-CM | POA: Diagnosis not present

## 2019-07-19 DIAGNOSIS — G309 Alzheimer's disease, unspecified: Secondary | ICD-10-CM | POA: Diagnosis not present

## 2019-07-19 DIAGNOSIS — E7800 Pure hypercholesterolemia, unspecified: Secondary | ICD-10-CM | POA: Diagnosis not present

## 2019-07-19 DIAGNOSIS — N183 Chronic kidney disease, stage 3 unspecified: Secondary | ICD-10-CM | POA: Diagnosis not present

## 2019-07-19 DIAGNOSIS — Z87891 Personal history of nicotine dependence: Secondary | ICD-10-CM | POA: Diagnosis not present

## 2019-07-22 DIAGNOSIS — N3281 Overactive bladder: Secondary | ICD-10-CM | POA: Diagnosis not present

## 2019-07-22 DIAGNOSIS — Z87891 Personal history of nicotine dependence: Secondary | ICD-10-CM | POA: Diagnosis not present

## 2019-07-22 DIAGNOSIS — Z7982 Long term (current) use of aspirin: Secondary | ICD-10-CM | POA: Diagnosis not present

## 2019-07-22 DIAGNOSIS — G309 Alzheimer's disease, unspecified: Secondary | ICD-10-CM | POA: Diagnosis not present

## 2019-07-22 DIAGNOSIS — E7800 Pure hypercholesterolemia, unspecified: Secondary | ICD-10-CM | POA: Diagnosis not present

## 2019-07-22 DIAGNOSIS — I129 Hypertensive chronic kidney disease with stage 1 through stage 4 chronic kidney disease, or unspecified chronic kidney disease: Secondary | ICD-10-CM | POA: Diagnosis not present

## 2019-07-22 DIAGNOSIS — N183 Chronic kidney disease, stage 3 unspecified: Secondary | ICD-10-CM | POA: Diagnosis not present

## 2019-07-26 DIAGNOSIS — Z7982 Long term (current) use of aspirin: Secondary | ICD-10-CM | POA: Diagnosis not present

## 2019-07-26 DIAGNOSIS — E7800 Pure hypercholesterolemia, unspecified: Secondary | ICD-10-CM | POA: Diagnosis not present

## 2019-07-26 DIAGNOSIS — N183 Chronic kidney disease, stage 3 unspecified: Secondary | ICD-10-CM | POA: Diagnosis not present

## 2019-07-26 DIAGNOSIS — G309 Alzheimer's disease, unspecified: Secondary | ICD-10-CM | POA: Diagnosis not present

## 2019-07-26 DIAGNOSIS — I129 Hypertensive chronic kidney disease with stage 1 through stage 4 chronic kidney disease, or unspecified chronic kidney disease: Secondary | ICD-10-CM | POA: Diagnosis not present

## 2019-07-26 DIAGNOSIS — N3281 Overactive bladder: Secondary | ICD-10-CM | POA: Diagnosis not present

## 2019-07-26 DIAGNOSIS — Z87891 Personal history of nicotine dependence: Secondary | ICD-10-CM | POA: Diagnosis not present

## 2019-08-01 ENCOUNTER — Telehealth: Payer: Self-pay

## 2019-08-01 DIAGNOSIS — N183 Chronic kidney disease, stage 3 unspecified: Secondary | ICD-10-CM | POA: Diagnosis not present

## 2019-08-01 DIAGNOSIS — N3281 Overactive bladder: Secondary | ICD-10-CM | POA: Diagnosis not present

## 2019-08-01 DIAGNOSIS — Z7982 Long term (current) use of aspirin: Secondary | ICD-10-CM | POA: Diagnosis not present

## 2019-08-01 DIAGNOSIS — E7800 Pure hypercholesterolemia, unspecified: Secondary | ICD-10-CM | POA: Diagnosis not present

## 2019-08-01 DIAGNOSIS — Z87891 Personal history of nicotine dependence: Secondary | ICD-10-CM | POA: Diagnosis not present

## 2019-08-01 DIAGNOSIS — I129 Hypertensive chronic kidney disease with stage 1 through stage 4 chronic kidney disease, or unspecified chronic kidney disease: Secondary | ICD-10-CM | POA: Diagnosis not present

## 2019-08-01 DIAGNOSIS — G309 Alzheimer's disease, unspecified: Secondary | ICD-10-CM | POA: Diagnosis not present

## 2019-08-05 ENCOUNTER — Telehealth: Payer: Self-pay

## 2019-08-05 NOTE — Telephone Encounter (Signed)
Verbal orders given to extend pt 1 time a week for 3 weeks starting 08/04/19

## 2019-08-06 DIAGNOSIS — N183 Chronic kidney disease, stage 3 unspecified: Secondary | ICD-10-CM | POA: Diagnosis not present

## 2019-08-06 DIAGNOSIS — E7800 Pure hypercholesterolemia, unspecified: Secondary | ICD-10-CM | POA: Diagnosis not present

## 2019-08-06 DIAGNOSIS — I129 Hypertensive chronic kidney disease with stage 1 through stage 4 chronic kidney disease, or unspecified chronic kidney disease: Secondary | ICD-10-CM | POA: Diagnosis not present

## 2019-08-06 DIAGNOSIS — Z87891 Personal history of nicotine dependence: Secondary | ICD-10-CM | POA: Diagnosis not present

## 2019-08-06 DIAGNOSIS — Z7982 Long term (current) use of aspirin: Secondary | ICD-10-CM | POA: Diagnosis not present

## 2019-08-06 DIAGNOSIS — N3281 Overactive bladder: Secondary | ICD-10-CM | POA: Diagnosis not present

## 2019-08-06 DIAGNOSIS — G309 Alzheimer's disease, unspecified: Secondary | ICD-10-CM | POA: Diagnosis not present

## 2019-08-15 DIAGNOSIS — E7800 Pure hypercholesterolemia, unspecified: Secondary | ICD-10-CM | POA: Diagnosis not present

## 2019-08-15 DIAGNOSIS — N3281 Overactive bladder: Secondary | ICD-10-CM | POA: Diagnosis not present

## 2019-08-15 DIAGNOSIS — I129 Hypertensive chronic kidney disease with stage 1 through stage 4 chronic kidney disease, or unspecified chronic kidney disease: Secondary | ICD-10-CM | POA: Diagnosis not present

## 2019-08-15 DIAGNOSIS — N183 Chronic kidney disease, stage 3 unspecified: Secondary | ICD-10-CM | POA: Diagnosis not present

## 2019-08-15 DIAGNOSIS — Z7982 Long term (current) use of aspirin: Secondary | ICD-10-CM | POA: Diagnosis not present

## 2019-08-15 DIAGNOSIS — G309 Alzheimer's disease, unspecified: Secondary | ICD-10-CM | POA: Diagnosis not present

## 2019-08-15 DIAGNOSIS — Z87891 Personal history of nicotine dependence: Secondary | ICD-10-CM | POA: Diagnosis not present

## 2019-08-23 DIAGNOSIS — E7800 Pure hypercholesterolemia, unspecified: Secondary | ICD-10-CM | POA: Diagnosis not present

## 2019-08-23 DIAGNOSIS — N183 Chronic kidney disease, stage 3 unspecified: Secondary | ICD-10-CM | POA: Diagnosis not present

## 2019-08-23 DIAGNOSIS — I129 Hypertensive chronic kidney disease with stage 1 through stage 4 chronic kidney disease, or unspecified chronic kidney disease: Secondary | ICD-10-CM | POA: Diagnosis not present

## 2019-08-23 DIAGNOSIS — Z87891 Personal history of nicotine dependence: Secondary | ICD-10-CM | POA: Diagnosis not present

## 2019-08-23 DIAGNOSIS — N3281 Overactive bladder: Secondary | ICD-10-CM | POA: Diagnosis not present

## 2019-08-23 DIAGNOSIS — G309 Alzheimer's disease, unspecified: Secondary | ICD-10-CM | POA: Diagnosis not present

## 2019-08-23 DIAGNOSIS — Z7982 Long term (current) use of aspirin: Secondary | ICD-10-CM | POA: Diagnosis not present

## 2019-09-25 ENCOUNTER — Telehealth: Payer: Self-pay

## 2019-09-25 ENCOUNTER — Other Ambulatory Visit: Payer: Self-pay

## 2019-09-25 DIAGNOSIS — R1310 Dysphagia, unspecified: Secondary | ICD-10-CM

## 2019-10-03 DIAGNOSIS — I10 Essential (primary) hypertension: Secondary | ICD-10-CM | POA: Diagnosis not present

## 2019-10-03 DIAGNOSIS — F0281 Dementia in other diseases classified elsewhere with behavioral disturbance: Secondary | ICD-10-CM | POA: Diagnosis not present

## 2019-10-03 DIAGNOSIS — G301 Alzheimer's disease with late onset: Secondary | ICD-10-CM | POA: Diagnosis not present

## 2019-10-03 DIAGNOSIS — Z87891 Personal history of nicotine dependence: Secondary | ICD-10-CM | POA: Diagnosis not present

## 2019-10-03 DIAGNOSIS — N400 Enlarged prostate without lower urinary tract symptoms: Secondary | ICD-10-CM

## 2019-10-03 DIAGNOSIS — Z7982 Long term (current) use of aspirin: Secondary | ICD-10-CM

## 2019-10-10 ENCOUNTER — Ambulatory Visit (INDEPENDENT_AMBULATORY_CARE_PROVIDER_SITE_OTHER): Payer: Medicare Other

## 2019-10-10 DIAGNOSIS — F0391 Unspecified dementia with behavioral disturbance: Secondary | ICD-10-CM

## 2019-10-10 DIAGNOSIS — N183 Chronic kidney disease, stage 3 unspecified: Secondary | ICD-10-CM

## 2019-10-10 NOTE — Patient Instructions (Addendum)
Social Worker Visit Information  Goals we discussed today:  Goals Addressed            This Visit's Progress   . Assist with ongoing care management and care coordination needs       CARE PLAN ENTRY (see longitudinal plan of care for additional care plan information)  Current Barriers:  . Ongoing chronic conditions including HTN and Dementia . Limited ability to manage own care coordination needs due to limitations from dementia  Social Work Clinical Goal(s):  [name] Over the next 120 days the patient will work with CM team to address ongoing care management concerns. Goal not met due to scheduling limitations surrounding COVID 19 pandemic . New 10/10/19- Over the next 60 days the patient and his caregivers will work with SW to become more knowledgeable of respite resources available to the patient  CCM SW Interventions: Completed 10/10/19 with [name] . Inter-disciplinary care team collaboration (see longitudinal plan of care) . Successful outbound call placed to the patients niece, [name], in response to a voice message received requesting SW outreach . Determined [name] is interested in resources to assist with caregiver respite when she and her mom are out of the home . Provided education on local resources including Diplomatic Services operational officer program, [facility] "Connections" program, and [place] in home aide program o [name] reports interest in [employer] and [facility] program but declines referral to in home aide program stating the patient "does not need a CNA" . Advised [name] would mail information on the [facility] program for her to review  . Collaboration with Leafy Half to successfully refer the patient to [employer] [facility] [place] for Caregiver Respite o [name] confirms she will contact [name] to process referral . Collaboration with RN Care Manager regarding plan to link patient to a caregiver  respite program  Patient Self Care Activities:  . Attends all scheduled provider appointments . Calls provider office for new concerns or questions . Patients sister verbalizes understanding to contact CM team with future care management needs  Please see past updates related to this goal by clicking on the "Past Updates" button in the selected goal          Materials Provided: Verbal education about caregiver respite resources provided by phone. Mailed program flyer for [facility] "Connections" program  Follow Up Plan: SW will follow up with patient by phone over the next month.  [name], BSW, CDP Social Worker, Certified Dementia Practitioner [place] [facility] [phone]

## 2019-10-10 NOTE — Chronic Care Management (AMB) (Signed)
Chronic Care Management    Social Work Follow Up Note  10/10/2019 Name: ITAI BARBIAN MRN: [id number] DOB: 1932/04/29  Catalina Gravel Old is a 86 y.o. year old male who is a primary care patient of [name], MD. The CCM team was consulted for assistance with care coordination.   Review of patient status, including review of consultants reports, other relevant assessments, and collaboration with appropriate care team members and the patient's provider was performed as part of comprehensive patient evaluation and provision of chronic care management services.    SDOH (Social Determinants of Health) assessments performed: No.    Outpatient Encounter Medications as of 10/10/2019  Medication Sig  . acetaminophen (TYLENOL) 500 MG tablet Take 1,000 mg by mouth every 6 (six) hours as needed for moderate pain.  [name] amLODipine (NORVASC) 5 MG tablet TAKE 1 TABLET BY MOUTH EVERY DAY  . Ascorbic Acid (VITAMIN C PO) Take 100 mg by mouth daily.  [name] aspirin EC 81 MG tablet Take 81 mg by mouth every evening.   . Calcium Carbonate Antacid (CALCIUM CARBONATE PO) Take 100 mg by mouth. 3 x weekly  . cholecalciferol (VITAMIN D3) 25 MCG (1000 UT) tablet Take 2,000 Units by mouth every evening.  . diphenhydrAMINE HCl, Sleep, (ZZZQUIL PO) Take 1 tablet by mouth at bedtime as needed.  [name] losartan-hydrochlorothiazide (HYZAAR) 100-25 MG tablet TAKE 1 TABLET BY MOUTH EVERY DAY  . memantine (NAMENDA) 10 MG tablet TAKE 1 TABLET BY MOUTH TWICE A DAY  . tamsulosin (FLOMAX) 0.4 MG CAPS capsule TAKE 1 CAPSULE BY MOUTH EVERY DAY  . vitamin E 200 UNIT capsule Take 200 Units by mouth every evening.   No facility-administered encounter medications on file as of 10/10/2019.     Goals Addressed            This Visit's Progress   . Assist with ongoing care management and care coordination needs       CARE PLAN ENTRY (see longitudinal plan of care for additional care plan information)  Current Barriers:  . Ongoing  chronic conditions including HTN and Dementia . Limited ability to manage own care coordination needs due to limitations from dementia  Social Work Clinical Goal(s):  [name] Over the next 120 days the patient will work with CM team to address ongoing care management concerns. Goal not met due to scheduling limitations surrounding COVID 19 pandemic . New 10/10/19- Over the next 60 days the patient and his caregivers will work with SW to become more knowledgeable of respite resources available to the patient  CCM SW Interventions: Completed 10/10/19 with [name] . Inter-disciplinary care team collaboration (see longitudinal plan of care) . Successful outbound call placed to the patients niece, [name], in response to a voice message received requesting SW outreach . Determined [name] is interested in resources to assist with caregiver respite when she and her mom are out of the home . Provided education on local resources including Diplomatic Services operational officer program, [facility] "Connections" program, and [place] in home aide program o [name] reports interest in [employer] and [facility] program but declines referral to in home aide program stating the patient "does not need a CNA" . Advised [name] would mail information on the [facility] program for her to review  . Collaboration with Leafy Half to successfully refer the patient to [employer] [facility] [place] for Caregiver Respite o [name] confirms she will contact [name] to process  referral . Collaboration with RN Care Manager regarding plan to link patient to a caregiver respite program  Patient Self Care Activities:  . Attends all scheduled provider appointments . Calls provider office for new concerns or questions . Patients sister verbalizes understanding to contact CM team with future care management needs  Please see past updates related to this goal by clicking on the  "Past Updates" button in the selected goal          Follow Up Plan: SW will follow up with patient by phone over the next month.   [name], BSW, CDP Social Worker, Certified Dementia Practitioner [place] [facility] [place] [facility] [phone]  Total time spent performing care coordination and/or care management activities with the patient by phone or face to face = 23 minutes.

## 2019-10-15 ENCOUNTER — Ambulatory Visit: Payer: Self-pay

## 2019-10-15 ENCOUNTER — Telehealth: Payer: Self-pay

## 2019-10-15 ENCOUNTER — Other Ambulatory Visit: Payer: Self-pay

## 2019-10-15 DIAGNOSIS — I10 Essential (primary) hypertension: Secondary | ICD-10-CM | POA: Diagnosis not present

## 2019-10-15 DIAGNOSIS — I129 Hypertensive chronic kidney disease with stage 1 through stage 4 chronic kidney disease, or unspecified chronic kidney disease: Secondary | ICD-10-CM | POA: Diagnosis not present

## 2019-10-15 DIAGNOSIS — N183 Chronic kidney disease, stage 3 unspecified: Secondary | ICD-10-CM

## 2019-10-15 DIAGNOSIS — F0391 Unspecified dementia with behavioral disturbance: Secondary | ICD-10-CM | POA: Diagnosis not present

## 2019-10-18 ENCOUNTER — Ambulatory Visit: Payer: Self-pay

## 2019-10-18 DIAGNOSIS — F0391 Unspecified dementia with behavioral disturbance: Secondary | ICD-10-CM

## 2019-10-18 DIAGNOSIS — N183 Chronic kidney disease, stage 3 unspecified: Secondary | ICD-10-CM | POA: Diagnosis not present

## 2019-10-18 DIAGNOSIS — I129 Hypertensive chronic kidney disease with stage 1 through stage 4 chronic kidney disease, or unspecified chronic kidney disease: Secondary | ICD-10-CM

## 2019-10-18 DIAGNOSIS — I10 Essential (primary) hypertension: Secondary | ICD-10-CM

## 2019-10-18 NOTE — Patient Instructions (Signed)
Visit Information  Goals Addressed    . "he needs to drink more water"       Sister stated CARE PLAN ENTRY (see longitudinal plan of care for additional care plan information)  Current Barriers:  [name] Knowledge deficit related to disease process and Self Health management of CKD . Chronic Disease Management support and education needs related to Essential Hypertension, Dementia, Parenchymal renal hypertension, stage 1 through 4, CKD stage 3    Nurse Case Manager Clinical Goal(s):  [name] Over the next 90 days, patient will work with the CCM team and PCP to address needs related to disease education and support to help improve Self Health management of CKD   CCM RN CM Interventions:  10/16/19 call completed with patient's sister [name]  . Inter-disciplinary care team collaboration (see longitudinal plan of care) . Evaluation of current treatment plan related to CKD and patient's adherence to plan as established by provider. . Provided education to patient re: stages of Chronic disease; Educated on most recent GFR having improved from previous level's; Provided positive reinforcement provided for making efforts to help improve patient's renal function; Reinforced importance of having patient increase his water intake, and increase his activity as tolerated  . Discussed plans with patient for ongoing care management follow up and provided patient with direct contact information for care management team . Provided patient with printed educational materials related to 6 Ways to be Water Wise, Stages of Chronic Kidney disease  Patient Self Care Activities:  . Attends all scheduled provider appointments . Calls pharmacy for medication refills . Calls provider office for new concerns or questions . Supportive family to assist with care needs   Initial goal documentation       Patient verbalizes understanding of instructions provided today.   Telephone follow up appointment with care  management team member scheduled for: 12/02/19  Delsa Sale, RN, BSN, CCM Care Management Coordinator [facility]  Direct Phone: [phone]

## 2019-10-18 NOTE — Chronic Care Management (AMB) (Signed)
Chronic Care Management    Social Work Follow Up Note  10/18/2019 Name: Manuel [name] MRN: [id number] DOB: [birth year]  Manuel [name] is a 86 y.o. year old male who is a primary care patient of Dorothyann Peng, MD. The CCM team was consulted for assistance with care coordination.   Review of patient status, including review of consultants reports, other relevant assessments, and collaboration with appropriate care team members and the patient's provider was performed as part of comprehensive patient evaluation and provision of chronic care management services.    SDOH (Social Determinants of Health) assessments performed: No    Outpatient Encounter Medications as of 10/18/2019  Medication Sig  . acetaminophen (TYLENOL) 500 MG tablet Take 1,000 mg by mouth every 6 (six) hours as needed for moderate pain.  [name] amLODipine (NORVASC) 5 MG tablet TAKE 1 TABLET BY MOUTH EVERY DAY  . Ascorbic Acid (VITAMIN C PO) Take 100 mg by mouth daily.  [name] aspirin EC 81 MG tablet Take 81 mg by mouth every evening.   . Calcium Carbonate Antacid (CALCIUM CARBONATE PO) Take 100 mg by mouth. 3 x weekly  . cholecalciferol (VITAMIN D3) 25 MCG (1000 UT) tablet Take 2,000 Units by mouth every evening.  . diphenhydrAMINE HCl, Sleep, (ZZZQUIL PO) Take 1 tablet by mouth at bedtime as needed.  [name] losartan-hydrochlorothiazide (HYZAAR) 100-25 MG tablet TAKE 1 TABLET BY MOUTH EVERY DAY  . memantine (NAMENDA) 10 MG tablet TAKE 1 TABLET BY MOUTH TWICE A DAY  . tamsulosin (FLOMAX) 0.4 MG CAPS capsule TAKE 1 CAPSULE BY MOUTH EVERY DAY  . vitamin E 200 UNIT capsule Take 200 Units by mouth every evening.   No facility-administered encounter medications on file as of 10/18/2019.     Goals Addressed            This Visit's Progress   . Assist with ongoing care management and care coordination needs   On track    CARE PLAN ENTRY (see longitudinal plan of care for additional care plan information)  Current Barriers:   . Ongoing chronic conditions including HTN and Dementia . Limited ability to manage own care coordination needs due to limitations from dementia  Social Work Clinical Goal(s):  [name] Over the next 120 days the patient will work with CM team to address ongoing care management concerns. Goal not met due to scheduling limitations surrounding COVID 19 pandemic . New 10/10/19- Over the next 60 days the patient and his caregivers will work with SW to become more knowledgeable of respite resources available to the patient  CCM SW Interventions: Completed 10/18/19 with [name] . Successful outbound call placed to the patients caregiver and niece, [name], in response to voice message received requesting a return call . Determined Mrs. Tanya Nones has received resource sent via mail . Discussed opportunity to contact [facility] regarding "Connections" program for the patient to attend during the week to allow for interaction as well as caregiver respite time o Mrs. Tanya Nones intends to contact [facility] to gather more information including cost . Reviewed patient UHC benefits via phone o Patient active with a [facility] Health Navigator o Encouraged Mrs. Tanya Nones to research patient current personal emergency response system (PERS) to determine if fall detection is included o Provided education on PERS systems offered under [facility] benefit under specific plans that include fall detection . Discussed patient plans to follow up with dentist on 7/12 to continue process forward in receiving dentures o Patient has recently had all  remaining teeth extracted o Next steps include impressions and molds . Determined [name] has not yet been contacted by [facility] regarding caregiver respite program o Collaboration with Mrs. Scott who reports plans to contact family over the next several weeks  Completed 10/10/19 with [name] . Inter-disciplinary care team collaboration (see  longitudinal plan of care) . Successful outbound call placed to the patients niece, [name], in response to a voice message received requesting SW outreach . Determined [name] is interested in resources to assist with caregiver respite when she and her mom are out of the home . Provided education on local resources including [employer] operational officer program, [facility] "Connections" program, and [place] in home aide program o [name] reports interest in [employer] and [facility] program but declines referral to in home aide program stating the patient "does not need a CNA" . Advised [name] would mail information on the [facility] program for her to review  . Collaboration with Leafy Half to successfully refer the patient to [facility] [place] for Caregiver Respite o [name] confirms she will contact [name] to process referral . Collaboration with RN Care Manager regarding plan to link patient to a caregiver respite program  Patient Self Care Activities:  . Attends all scheduled provider appointments . Calls provider office for new concerns or questions . Patients sister verbalizes understanding to contact CM team with future care management needs  Please see past updates related to this goal by clicking on the "Past Updates" button in the selected goal          Follow Up Plan: SW will follow up with patient by phone over the next 3 weeks.   [name], BSW, CDP Social Worker, Certified Dementia Practitioner [place] [facility] [place] [facility] [phone]  Total time spent performing care coordination and/or care management activities with the patient by phone or face to face = 25 minutes.

## 2019-10-18 NOTE — Chronic Care Management (AMB) (Signed)
Chronic Care Management   Follow Up Note   10/16/2019 Name: Manuel [name] MRN: [id number] DOB: 07-31-1932  Referred by: Dorothyann Peng, MD Reason for referral : Chronic Care Management (FU RN CM Call )   ARSHAN JABS is a 86 y.o. year old male who is a primary care patient of Dorothyann Peng, MD. The CCM team was consulted for assistance with chronic disease management and care coordination needs.    Review of patient status, including review of consultants reports, relevant laboratory and other test results, and collaboration with appropriate care team members and the patient's provider was performed as part of comprehensive patient evaluation and provision of chronic care management services.    SDOH (Social Determinants of Health) assessments performed: Yes - see SW goal  See Care Plan activities for detailed interventions related to [facility])    Placed outbound call to patient's sister [name] for a CCM RN CM care plan follow up.   Outpatient Encounter Medications as of 10/15/2019  Medication Sig  . acetaminophen (TYLENOL) 500 MG tablet Take 1,000 mg by mouth every 6 (six) hours as needed for moderate pain.  Manuel [name] amLODipine (NORVASC) 5 MG tablet TAKE 1 TABLET BY MOUTH EVERY DAY  . Ascorbic Acid (VITAMIN C PO) Take 100 mg by mouth daily.  Manuel [name] aspirin EC 81 MG tablet Take 81 mg by mouth every evening.   . Calcium Carbonate Antacid (CALCIUM CARBONATE PO) Take 100 mg by mouth. 3 x weekly  . cholecalciferol (VITAMIN D3) 25 MCG (1000 UT) tablet Take 2,000 Units by mouth every evening.  . diphenhydrAMINE HCl, Sleep, (ZZZQUIL PO) Take 1 tablet by mouth at bedtime as needed.  Manuel [name] losartan-hydrochlorothiazide (HYZAAR) 100-25 MG tablet TAKE 1 TABLET BY MOUTH EVERY DAY  . memantine (NAMENDA) 10 MG tablet TAKE 1 TABLET BY MOUTH TWICE A DAY  . tamsulosin (FLOMAX) 0.4 MG CAPS capsule TAKE 1 CAPSULE BY MOUTH EVERY DAY  . vitamin E 200 UNIT capsule Take 200 Units by mouth every evening.   No  facility-administered encounter medications on file as of 10/15/2019.     Objective:  Lab Results  Component Value Date   HGBA1C 5.1 06/06/2018   Lab Results  Component Value Date   LDLCALC 82 12/26/2018   CREATININE 1.20 06/25/2019   BP Readings from Last 3 Encounters:  06/25/19 130/86  12/26/18 136/70  12/26/18 136/70    Goals Addressed    . "he needs to drink more water"       Sister stated CARE PLAN ENTRY (see longitudinal plan of care for additional care plan information)  Current Barriers:  Manuel [name] Knowledge deficit related to disease process and Self Health management of CKD . Chronic Disease Management support and education needs related to Essential Hypertension, Dementia, Parenchymal renal hypertension, stage 1 through 4, CKD stage 3    Nurse Case Manager Clinical Goal(s):  Manuel [name] Over the next 90 days, patient will work with the CCM team and PCP to address needs related to disease education and support to help improve Self Health management of CKD   CCM RN CM Interventions:  10/16/19 call completed with patient's sister Manuel [name]  . Inter-disciplinary care team collaboration (see longitudinal plan of care) . Evaluation of current treatment plan related to CKD and patient's adherence to plan as established by provider. . Provided education to patient re: stages of Chronic disease; Educated on most recent GFR having improved from previous level's; Provided positive reinforcement provided for making efforts to help improve patient's  renal function; Reinforced importance of having patient increase his water intake, and increase his activity as tolerated  . Discussed plans with patient for ongoing care management follow up and provided patient with direct contact information for care management team . Provided patient with printed educational materials related to 6 Ways to be Water Wise, Stages of Chronic Kidney disease  Patient Self Care Activities:  . Attends all scheduled  provider appointments . Calls pharmacy for medication refills . Calls provider office for new concerns or questions . Supportive family to assist with care needs   Initial goal documentation       Plan:   Telephone follow up appointment with care management team member scheduled for: 12/02/19   Barb Merino, RN, BSN, CCM Care Management Coordinator [place] [facility]  Direct Phone: [phone]

## 2019-10-18 NOTE — Patient Instructions (Signed)
Social Worker Visit Information  Goals we discussed today:  Goals Addressed            This Visit's Progress   . Assist with ongoing care management and care coordination needs   On track    [place] (see longitudinal plan of care for additional care plan information)  Current Barriers:  . Ongoing chronic conditions including HTN and Dementia . Limited ability to manage own care coordination needs due to limitations from dementia  Social Work Clinical Goal(s):  [name] Over the next 120 days the patient will work with CM team to address ongoing care management concerns. Goal not met due to scheduling limitations surrounding COVID 19 pandemic . New 10/10/19- Over the next 60 days the patient and his caregivers will work with SW to become more knowledgeable of respite resources available to the patient  CCM SW Interventions: Completed 10/18/19 with [name] . Successful outbound call placed to the patients caregiver and niece, [name], in response to voice message received requesting a return call . Determined [name] has received resource sent via mail . Discussed opportunity to contact [facility] regarding "Connections" program for the patient to attend during the week to allow for interaction as well as caregiver respite time o [name] intends to contact [facility] to gather more information including cost . Reviewed patient UHC benefits via phone o Patient active with a [facility] Health Navigator o Encouraged [name] to research patient current personal emergency response system (PERS) to determine if fall detection is included o Provided education on PERS systems offered under [facility] benefit under specific plans that include fall detection . Discussed patient plans to follow up with dentist on 7/12 to continue process forward in receiving dentures o Patient has recently had all remaining teeth extracted o Next steps include impressions and  molds . Determined [name] has not yet been contacted by [facility] [place] regarding caregiver respite program o Collaboration with Mrs. Scott who reports plans to contact family over the next several weeks  Completed 10/10/19 with [name] . Inter-disciplinary care team collaboration (see longitudinal plan of care) . Successful outbound call placed to the patients niece, [name], in response to a voice message received requesting SW outreach . Determined [name] is interested in resources to assist with caregiver respite when she and her mom are out of the home . Provided education on local resources including Diplomatic Services operational officer program, [facility] "Connections" program, and [place] in home aide program o [name] reports interest in [employer] and [facility] program but declines referral to in home aide program stating the patient "does not need a CNA" . Advised [name] would mail information on the [facility] program for her to review  . Collaboration with Leafy Half to successfully refer the patient to [facility] [place] for Caregiver Respite o [name] confirms she will contact [name] to process referral . Collaboration with RN Care Manager regarding plan to link patient to a caregiver respite program  Patient Self Care Activities:  . Attends all scheduled provider appointments . Calls provider office for new concerns or questions . Patients sister verbalizes understanding to contact CM team with future care management needs  Please see past updates related to this goal by clicking on the "Past Updates" button in the selected goal          Materials Provided: Verbal education about health  plan benefits and caregiver resources provided by phone  Follow Up Plan: SW will follow up with patient by phone over the next three weeks.   [name], BSW, CDP Social Worker, Certified  Dementia Practitioner [place] / [place] [facility] [phone]

## 2019-10-25 ENCOUNTER — Ambulatory Visit: Payer: Self-pay

## 2019-10-25 DIAGNOSIS — F0391 Unspecified dementia with behavioral disturbance: Secondary | ICD-10-CM

## 2019-10-25 DIAGNOSIS — N183 Chronic kidney disease, stage 3 unspecified: Secondary | ICD-10-CM

## 2019-10-25 NOTE — Chronic Care Management (AMB) (Signed)
  Chronic Care Management   Outreach Note  10/25/2019 Name: Manuel [name] MRN: [id number] DOB: [birth year]  Referred by: Dorothyann Peng, MD Reason for referral : Care Coordination   SW placed an unsuccessful outbound call to the patients niece, [name], in response to a voice message requesting SW assistance. SW received a text message from Mrs. Tanya Nones after missed call stating "I will call you right back". No return call has been received at this time.  Follow Up Plan: SW will follow up with the patient and his family over the next 10 days as previously planned.  Bevelyn Ngo, BSW, CDP Social Worker, Certified Dementia Practitioner [facility] Management [phone]

## 2019-10-31 ENCOUNTER — Ambulatory Visit (INDEPENDENT_AMBULATORY_CARE_PROVIDER_SITE_OTHER): Payer: Medicare Other

## 2019-10-31 DIAGNOSIS — F0391 Unspecified dementia with behavioral disturbance: Secondary | ICD-10-CM

## 2019-10-31 DIAGNOSIS — N183 Chronic kidney disease, stage 3 unspecified: Secondary | ICD-10-CM | POA: Diagnosis not present

## 2019-10-31 NOTE — Patient Instructions (Signed)
Social Worker Visit Information  Goals we discussed today:  Goals Addressed            This Visit's Progress   . Assist with ongoing care management and care coordination needs       CARE PLAN ENTRY (see longitudinal plan of care for additional care plan information)  Current Barriers:  . Ongoing chronic conditions including HTN and Dementia . Limited ability to manage own care coordination needs due to limitations from dementia  Social Work Clinical Goal(s):  [name] Over the next 120 days the patient will work with CM team to address ongoing care management concerns. Goal not met due to scheduling limitations surrounding COVID 19 pandemic . New 10/10/19- Over the next 60 days the patient and his caregivers will work with SW to become more knowledgeable of respite resources available to the patient  CCM SW Interventions: Completed 10/31/19 with [name] . Collaboration with Michelle Nasuti, [place] with primary proivder office who reports contact by [facility] requesting follow up on respite care plans for the patient . Successful outbound call placed to the patients niece, [name] to review previous education surrounding respite care resources o Discussed opportunity to enroll patient with [facility] "Connections" program - family not interested at this time due to cost o Reviewed the patient recent referral to [facility] caregiver respite program- family has yet to be contacted. SW advised the last communication received from [name] was the family would be contacted over the next several weeks . Discussed [name] plans to go out of town for a week and is concerned about leaving the patient and his sister alone in the home o The patients other niece also lives in [place] and is available to help as needed Lyn Hollingshead from [facility] plans to check on the patient daily from Tuesday - Friday . Advised [name] there are no resources to  assist with checking on the patient next week other than privately hiring a caregiver o [name] reports this is not an options . Provided education on local resources and the capacity in which their "respite" programs are able to offer assistance . Answered questions surrounding Medicare benefit and what it does/ does not cover in relations to respite caregiver assistance . Educated on the [facility] program including services offered and funding  o Obtained verbal consent to refer patient to [facility] to determine if the patient would qualify for services and what the monthly cost would be . Collaboration with Jola Baptist, SW with [facility]  o Referral received from Mrs. Lorel Monaco who reports plans to contact [name]  . Scheduled follow up over the next month  Patient Self Care Activities:  . Attends all scheduled provider appointments . Calls provider office for new concerns or questions . Patients sister verbalizes understanding to contact CM team with future care management needs  Please see past updates related to this goal by clicking on the "Past Updates" button in the selected goal          Materials Provided: Verbal education about respite resources provided by phone  Follow Up Plan: SW will follow up with patient by phone over the next month.  [name], BSW, CDP Social Worker, Certified Dementia Practitioner [place] [facility] [place] [facility] [phone]

## 2019-10-31 NOTE — Chronic Care Management (AMB) (Signed)
Chronic Care Management    Social Work Follow Up Note  10/31/2019 Name: Manuel [name] MRN: [id number] DOB: May 02, 1932  Manuel [name] is a 86 y.o. year old male who is a primary care patient of Manuel Chard, MD. The CCM team was consulted for assistance with care coordination.   Review of patient status, including review of consultants reports, other relevant assessments, and collaboration with appropriate care team members and the patient's provider was performed as part of comprehensive patient evaluation and provision of chronic care management services.    SDOH (Social Determinants of Health) assessments performed: No    Outpatient Encounter Medications as of 10/31/2019  Medication Sig  . acetaminophen (TYLENOL) 500 MG tablet Take 1,000 mg by mouth every 6 (six) hours as needed for moderate pain.  [name] amLODipine (NORVASC) 5 MG tablet TAKE 1 TABLET BY MOUTH EVERY DAY  . Ascorbic Acid (VITAMIN C PO) Take 100 mg by mouth daily.  [name] aspirin EC 81 MG tablet Take 81 mg by mouth every evening.   . Calcium Carbonate Antacid (CALCIUM CARBONATE PO) Take 100 mg by mouth. 3 x weekly  . cholecalciferol (VITAMIN D3) 25 MCG (1000 UT) tablet Take 2,000 Units by mouth every evening.  . diphenhydrAMINE HCl, Sleep, (ZZZQUIL PO) Take 1 tablet by mouth at bedtime as needed.  [name] losartan-hydrochlorothiazide (HYZAAR) 100-25 MG tablet TAKE 1 TABLET BY MOUTH EVERY DAY  . memantine (NAMENDA) 10 MG tablet TAKE 1 TABLET BY MOUTH TWICE A DAY  . tamsulosin (FLOMAX) 0.4 MG CAPS capsule TAKE 1 CAPSULE BY MOUTH EVERY DAY  . vitamin E 200 UNIT capsule Take 200 Units by mouth every evening.   No facility-administered encounter medications on file as of 10/31/2019.     Goals Addressed            This Visit's Progress   . Assist with ongoing care management and care coordination needs       CARE PLAN ENTRY (see longitudinal plan of care for additional care plan information)  Current Barriers:  . Ongoing  chronic conditions including HTN and Dementia . Limited ability to manage own care coordination needs due to limitations from dementia  Social Work Clinical Goal(s):  [name] Over the next 120 days the patient will work with CM team to address ongoing care management concerns. Goal not met due to scheduling limitations surrounding COVID 19 pandemic . New 10/10/19- Over the next 60 days the patient and his caregivers will work with Manuel to become more knowledgeable of respite resources available to the patient  CCM Manuel Interventions: Completed 10/31/19 with Manuel [name] . Collaboration with Manuel [name], [place] with primary proivder office who reports contact by [name] requesting follow up on respite care plans for the patient . Successful outbound call placed to the patients niece, Manuel [name] to review previous education surrounding respite care resources o Discussed opportunity to enroll patient with [name]" program - family not interested at this time due to cost o Reviewed the patient recent referral to [facility] caregiver respite program- family has yet to be contacted. Manuel advised the last communication received from Mrs. Manuel [name] was the family would be contacted over the next several weeks . Discussed Manuel [name] plans to go out of town for a week and is concerned about leaving the patient and his sister alone in the home o The patients other niece also lives in [place] and is available to help as needed Manuel [name] from [name] plans to check on the patient daily from Tuesday - Friday . Advised Manuel [name] there are no resources to assist with checking on the patient next week other than privately hiring a caregiver o Manuel [name] reports this is not an options . Provided education on local resources and the capacity in which their "respite" programs are able to offer assistance . Answered questions surrounding Medicare benefit and what  it does/ does not cover in relations to respite caregiver assistance . Educated on the [name] program including services offered and funding  o Obtained verbal consent to refer patient to [name] to determine if the patient would qualify for services and what the monthly cost would be . Collaboration with Manuel [name], Manuel [name]  o Referral received from Manuel [name] who reports plans to contact Manuel [name]  . Scheduled follow up over the next month  Patient Self Care Activities:  . Attends all scheduled provider appointments . Calls provider office for new concerns or questions . Patients sister verbalizes understanding to contact CM team with future care management needs  Please see past updates related to this goal by clicking on the "Past Updates" button in the selected goal          Follow Up Plan: Manuel will follow up with patient by phone over the next month.  Manuel [name], Manuel [name], CDP Social Worker, Certified Dementia Practitioner [place] [facility] [place] [facility] [phone]  Total time spent performing care coordination and/or care management activities with the patient by phone or face to face = 27 minutes.

## 2019-11-07 DIAGNOSIS — N390 Urinary tract infection, site not specified: Secondary | ICD-10-CM | POA: Diagnosis not present

## 2019-11-12 ENCOUNTER — Encounter: Payer: Self-pay | Admitting: Internal Medicine

## 2019-11-21 ENCOUNTER — Telehealth: Payer: Self-pay

## 2019-11-21 NOTE — Telephone Encounter (Signed)
The pt's niece [name] was notified that the prescription for the pt's Rolator walker is ready for pickup.

## 2019-12-02 ENCOUNTER — Encounter: Payer: Self-pay | Admitting: Internal Medicine

## 2019-12-02 ENCOUNTER — Telehealth: Payer: Self-pay

## 2019-12-09 ENCOUNTER — Telehealth: Payer: Self-pay

## 2019-12-09 ENCOUNTER — Ambulatory Visit: Payer: Medicare Other

## 2019-12-09 DIAGNOSIS — I10 Essential (primary) hypertension: Secondary | ICD-10-CM

## 2019-12-09 DIAGNOSIS — F0391 Unspecified dementia with behavioral disturbance: Secondary | ICD-10-CM

## 2019-12-09 NOTE — Chronic Care Management (AMB) (Signed)
Chronic Care Management    Social Work Follow Up Note  12/09/2019 Name: Manuel [name] MRN: [id number] DOB: 1933/02/06  Manuel [name] is a 86 y.o. year old male who is a primary care patient of [name], MD. The CCM team was consulted for assistance with care coordination.   Review of patient status, including review of consultants reports, other relevant assessments, and collaboration with appropriate care team members and the patient's provider was performed as part of comprehensive patient evaluation and provision of chronic care management services.    SDOH (Social Determinants of Health) assessments performed: No    Outpatient Encounter Medications as of 12/09/2019  Medication Sig  . acetaminophen (TYLENOL) 500 MG tablet Take 1,000 mg by mouth every 6 (six) hours as needed for moderate pain.  [name] amLODipine (NORVASC) 5 MG tablet TAKE 1 TABLET BY MOUTH EVERY DAY  . Ascorbic Acid (VITAMIN C PO) Take 100 mg by mouth daily.  [name] aspirin EC 81 MG tablet Take 81 mg by mouth every evening.   . Calcium Carbonate Antacid (CALCIUM CARBONATE PO) Take 100 mg by mouth. 3 x weekly  . cholecalciferol (VITAMIN D3) 25 MCG (1000 UT) tablet Take 2,000 Units by mouth every evening.  . diphenhydrAMINE HCl, Sleep, (ZZZQUIL PO) Take 1 tablet by mouth at bedtime as needed.  [name] losartan-hydrochlorothiazide (HYZAAR) 100-25 MG tablet TAKE 1 TABLET BY MOUTH EVERY DAY  . memantine (NAMENDA) 10 MG tablet TAKE 1 TABLET BY MOUTH TWICE A DAY  . tamsulosin (FLOMAX) 0.4 MG CAPS capsule TAKE 1 CAPSULE BY MOUTH EVERY DAY  . vitamin E 200 UNIT capsule Take 200 Units by mouth every evening.   No facility-administered encounter medications on file as of 12/09/2019.     Goals Addressed            This Visit's Progress   . Assist with ongoing care management and care coordination needs       CARE PLAN ENTRY (see longitudinal plan of care for additional care plan information)  Current Barriers:  . Ongoing  chronic conditions including HTN and Dementia . Limited ability to manage own care coordination needs due to limitations from dementia  Social Work Clinical Goal(s):  [name] Over the next 120 days the patient will work with CM team to address ongoing care management concerns. Goal not met due to scheduling limitations surrounding COVID 19 pandemic . New 10/10/19- Over the next 60 days the patient and his caregivers will work with SW to become more knowledgeable of respite resources available to the patient  CCM SW Interventions: Completed 12/09/19 with [name] . Successful outbound call placed to [name] to assess goal progression . Determined the patient is experiencing some difficulty swallowing o [name] reports the patient will not eat with dentures in  o The patient is having difficulty swallowing food including applesauce but is not experiencing difficulty with thin liquids o [name] reports she has noted some weight loss as well . Advised [name] the patient would need to be seen by his physician to assess needs and place a referral for home health . Collaboration with patient primary provider Dr. Baird Cancer regarding above information o Informed the patients niece was contacted by Michelle Nasuti, [place] who reports [name] does not want an appointment during the month of August and will bring patient to next scheduled appointment in September . Scheduled follow up call over the next two weeks  Completed 10/31/19 with [name] . Collaboration with  Michelle Nasuti, CMA with primary proivder office who reports contact by [facility] requesting follow up on respite care plans for the patient . Successful outbound call placed to the patients niece, [name] to review previous education surrounding respite care resources o Discussed opportunity to enroll patient with [facility]" program - family not interested at this time due to  cost o Reviewed the patient recent referral to [facility] caregiver respite program- family has yet to be contacted. SW advised the last communication received from [name] was the family would be contacted over the next several weeks . Discussed [name] plans to go out of town for a week and is concerned about leaving the patient and his sister alone in the home o The patients other niece also lives in [place] and is available to help as needed Lyn Hollingshead from [facility] plans to check on the patient daily from Tuesday - Friday . Advised [name] there are no resources to assist with checking on the patient next week other than privately hiring a caregiver o [name] reports this is not an options . Provided education on local resources and the capacity in which their "respite" programs are able to offer assistance . Answered questions surrounding Medicare benefit and what it does/ does not cover in relations to respite caregiver assistance . Educated on the [facility] program including services offered and funding  o Obtained verbal consent to refer patient to [facility] to determine if the patient would qualify for services and what the monthly cost would be . Collaboration with Jola Baptist, SW with [facility]  o Referral received from Mrs. Lorel Monaco who reports plans to contact [name]  . Scheduled follow up over the next month  Patient Self Care Activities:  . Attends all scheduled provider appointments . Calls provider office for new concerns or questions . Patients sister verbalizes understanding to contact CM team with future care management needs  Please see past updates related to this goal by clicking on the "Past Updates" button in the selected goal          Follow Up Plan: SW will follow up with patient by phone over the next two weeks.   [name], BSW, CDP Social Worker, Certified Dementia  Practitioner [place] [facility] [place] [facility] [phone]  Total time spent performing care coordination and/or care management activities with the patient by phone or face to face = 16 minutes.

## 2019-12-09 NOTE — Telephone Encounter (Signed)
toni said that she wanted an appt for next month for the pt, she was told he had an appt sept 16th and was she going to wait that long to bring him about the swallowing and she said yes that it's not that serious and that she will call ems if the pt gets worse.

## 2019-12-09 NOTE — Patient Instructions (Signed)
Social Worker Visit Information  Goals we discussed today:  Goals Addressed            This Visit's Progress   . Assist with ongoing care management and care coordination needs       CARE PLAN ENTRY (see longitudinal plan of care for additional care plan information)  Current Barriers:  . Ongoing chronic conditions including HTN and Dementia . Limited ability to manage own care coordination needs due to limitations from dementia  Social Work Clinical Goal(s):  [name] Over the next 120 days the patient will work with CM team to address ongoing care management concerns. Goal not met due to scheduling limitations surrounding COVID 19 pandemic . New 10/10/19- Over the next 60 days the patient and his caregivers will work with SW to become more knowledgeable of respite resources available to the patient  CCM SW Interventions: Completed 12/09/19 with [name] . Successful outbound call placed to [name] to assess goal progression . Determined the patient is experiencing some difficulty swallowing o [name] reports the patient will not eat with dentures in  o The patient is having difficulty swallowing food including applesauce but is not experiencing difficulty with thin liquids o [name] reports she has noted some weight loss as well . Advised [name] the patient would need to be seen by his physician to assess needs and place a referral for home health . Collaboration with patient primary provider Dr. Baird Cancer regarding above information o Informed the patients niece was contacted by Michelle Nasuti, [place] who reports [name] does not want an appointment during the month of August and will bring patient to next scheduled appointment in September . Scheduled follow up call over the next two weeks  Completed 10/31/19 with [name] . Collaboration with Michelle Nasuti, [place] with primary proivder office who reports contact by [facility] requesting follow up on  respite care plans for the patient . Successful outbound call placed to the patients niece, [name] to review previous education surrounding respite care resources o Discussed opportunity to enroll patient with [facility]" program - family not interested at this time due to cost o Reviewed the patient recent referral to [facility] caregiver respite program- family has yet to be contacted. SW advised the last communication received from [name] was the family would be contacted over the next several weeks . Discussed [name] plans to go out of town for a week and is concerned about leaving the patient and his sister alone in the home o The patients other niece also lives in [place] and is available to help as needed Lyn Hollingshead from [facility] plans to check on the patient daily from Tuesday - Friday . Advised [name] there are no resources to assist with checking on the patient next week other than privately hiring a caregiver o [name] reports this is not an options . Provided education on local resources and the capacity in which their "respite" programs are able to offer assistance . Answered questions surrounding Medicare benefit and what it does/ does not cover in relations to respite caregiver assistance . Educated on the [facility] including services offered and funding  o Obtained verbal consent to refer patient to [facility] to determine if the patient would qualify for services and what the monthly cost would be . Collaboration with Jola Baptist, SW with [facility]  o Referral received  from Mrs. Lorel Monaco who reports plans to contact [name]  . Scheduled follow up over the next month  Patient Self Care Activities:  . Attends all scheduled provider appointments . Calls provider office for new concerns or questions . Patients sister verbalizes understanding to contact CM team with  future care management needs  Please see past updates related to this goal by clicking on the "Past Updates" button in the selected goal          Materials Provided: Verbal education about home health services provided by phone  Follow Up Plan: SW will follow up with patient by phone over the next two weeks   [name], BSW, CDP Social Worker, Certified Dementia Practitioner [place] [facility] [place] [facility] [phone]

## 2019-12-12 ENCOUNTER — Ambulatory Visit (INDEPENDENT_AMBULATORY_CARE_PROVIDER_SITE_OTHER): Payer: Medicare Other

## 2019-12-12 ENCOUNTER — Telehealth: Payer: Medicare Other

## 2019-12-12 ENCOUNTER — Other Ambulatory Visit: Payer: Self-pay

## 2019-12-12 DIAGNOSIS — N183 Chronic kidney disease, stage 3 unspecified: Secondary | ICD-10-CM | POA: Diagnosis not present

## 2019-12-12 DIAGNOSIS — I10 Essential (primary) hypertension: Secondary | ICD-10-CM | POA: Diagnosis not present

## 2019-12-12 DIAGNOSIS — F0391 Unspecified dementia with behavioral disturbance: Secondary | ICD-10-CM | POA: Diagnosis not present

## 2019-12-12 DIAGNOSIS — R1310 Dysphagia, unspecified: Secondary | ICD-10-CM

## 2019-12-17 NOTE — Chronic Care Management (AMB) (Signed)
Chronic Care Management   Follow Up Note   12/12/2019 Name: Manuel [name] MRN: [id number] DOB: May 16, 1932  Referred by: Dorothyann Peng, MD Reason for referral : Chronic Care Management (FU RN CM Call)   Manuel [name] is a 86 y.o. year old male who is a primary care patient of Dorothyann Peng, MD. The CCM team was consulted for assistance with chronic disease management and care coordination needs.    Review of patient status, including review of consultants reports, relevant laboratory and other test results, and collaboration with appropriate care team members and the patient's provider was performed as part of comprehensive patient evaluation and provision of chronic care management services.    SDOH (Social Determinants of Health) assessments performed: Yes - no acute challenges identifed See Care Plan activities for detailed interventions related to SDOH)   Placed outbound follow up call to sister [name] for a CCM RN CM update.     Outpatient Encounter Medications as of 12/12/2019  Medication Sig  . acetaminophen (TYLENOL) 500 MG tablet Take 1,000 mg by mouth every 6 (six) hours as needed for moderate pain.  [name] amLODipine (NORVASC) 5 MG tablet TAKE 1 TABLET BY MOUTH EVERY DAY  . Ascorbic Acid (VITAMIN C PO) Take 100 mg by mouth daily.  [name] aspirin EC 81 MG tablet Take 81 mg by mouth every evening.   . Calcium Carbonate Antacid (CALCIUM CARBONATE PO) Take 100 mg by mouth. 3 x weekly  . cholecalciferol (VITAMIN D3) 25 MCG (1000 UT) tablet Take 2,000 Units by mouth every evening.  . diphenhydrAMINE HCl, Sleep, (ZZZQUIL PO) Take 1 tablet by mouth at bedtime as needed.  [name] losartan-hydrochlorothiazide (HYZAAR) 100-25 MG tablet TAKE 1 TABLET BY MOUTH EVERY DAY  . memantine (NAMENDA) 10 MG tablet TAKE 1 TABLET BY MOUTH TWICE A DAY  . tamsulosin (FLOMAX) 0.4 MG CAPS capsule TAKE 1 CAPSULE BY MOUTH EVERY DAY  . vitamin E 200 UNIT capsule Take 200 Units by mouth every evening.   No  facility-administered encounter medications on file as of 12/12/2019.     Objective:  Lab Results  Component Value Date   HGBA1C 5.1 06/06/2018   Lab Results  Component Value Date   LDLCALC 82 12/26/2018   CREATININE 1.20 06/25/2019   BP Readings from Last 3 Encounters:  06/25/19 130/86  12/26/18 136/70  12/26/18 136/70    Goals Addressed    . "he needs a W/C"       CARE PLAN ENTRY (see longitudinal plan of care for additional care plan information)  Current Barriers:  [name] Knowledge Deficits related to how to obtain DME (manual W/C) . Chronic Disease Management support and education needs related to Essential Hypertension, Dementia, Parenchymal renal hypertension, stage 1 through 4, CKD stage 3   . Cognitive Deficits  Nurse Case Manager Clinical Goal(s):  [name] Over the next 30 days, patient will work with PCP and DME supplier to address needs related to DME needs for improved mobility and to help reduce fall risk  Interventions:  . Inter-disciplinary care team collaboration (see longitudinal plan of care) . Evaluation of current treatment plan related to Impaired Physical Mobility/Gait and patient's adherence to plan as established by provider . Determined Mr. Talent will benefit from having a rollator walker for indoor and outdoor use to help with impaired gait and reduce his fall risk . Educated niece Sheralyn Boatman about how to obtain this type of DME from a DME supplier, coordinating MD order with PCP, advised her  of next steps  . Collaborated with PCP Dr. Allyne Gee via in basket message regarding patient's need for a rollator walker to be sent to [facility] . Discussed plans with patient for ongoing care management follow up and provided patient with direct contact information for care management team  Patient Self Care Activities:  . Self administers medications as prescribed . Attends all scheduled provider appointments . Calls pharmacy for medication refills . Calls provider office  for new concerns or questions . Supportive family to assist with care needs  Initial goal documentation     . "he needs to drink more water"   On track    Sister stated CARE PLAN ENTRY (see longitudinal plan of care for additional care plan information)  Current Barriers:  [name] Knowledge deficit related to disease process and Self Health management of CKD . Chronic Disease Management support and education needs related to Essential Hypertension, Dementia, Parenchymal renal hypertension, stage 1 through 4, CKD stage 3    Nurse Case Manager Clinical Goal(s):  [name] Over the next 90 days, patient will work with the CCM team and PCP to address needs related to disease education and support to help improve Self Health management of CKD   CCM RN CM Interventions:  12/12/19 call completed with patient's sister [name]  . Inter-disciplinary care team collaboration (see longitudinal plan of care) . Evaluation of current treatment plan related to CKD and patient's adherence to plan as established by provider. [name] Re-education to sister/niece re: stages of Chronic disease; Educated on most recent GFR having improved from previous level's; Provided positive reinforcement provided for making efforts to help improve patient's renal function; Reinforced importance of having patient increase his water intake, and increase his activity as tolerated  . Discussed plans with patient for ongoing care management follow up and provided patient with direct contact information for care management team  Patient Self Care Activities:  . Attends all scheduled provider appointments . Calls pharmacy for medication refills . Calls provider office for new concerns or questions . Supportive family to assist with care needs   Please see past updates related to this goal by clicking on the "Past Updates" button in the selected goal      . "he's having difficulty chewing and swallowing his food"       Sister/niece stated   CARE PLAN ENTRY (see longitudinal plan of care for additional care plan information)  Current Barriers:  [name] Knowledge Deficits related to evaluation and treatment of dysphagia and risk for aspiration  . Chronic Disease Management support and education needs related to Essential Hypertension, Dementia, Parenchymal renal hypertension, stage 1 through 4, CKD stage 3   . Cognitive Deficits  Nurse Case Manager Clinical Goal(s):  [name] Over the next 90 days, patient will work with the CCM team and PCP to address needs related to evaluation and treatment of dysphagia to help lower the risk for aspiration and malnutrition  Interventions:  . Inter-disciplinary care team collaboration (see longitudinal plan of care) . Evaluation of current treatment plan related to Impaired chewing and swallowing  and patient's adherence to plan as established by provider. . Provided education to patient re: potential risk for aspiration; Educated on recommendations for aspiration prevention; Educated on s/s suggestive of aspiration and to call the PCP and or CCM RN promptly to report symptoms or concerns  . Collaborated with PCP Dr. Dorothyann Peng via in basket message regarding reported s/s suggestive of dysphagia and difficulty chewing with new dentures,  advised of aspiration concerns; advised patient did not complete ST previously ordered; Requested a new referral for ST be sent to [facility] per family's request . Discussed plans with patient for ongoing care management follow up and provided patient with direct contact information for care management team . Provided patient with printed educational materials related to Aspiration prevention   Patient Self Care Activities:  . Self administers medications as prescribed . Attends all scheduled provider appointments . Calls pharmacy for medication refills . Calls provider office for new concerns or questions . Supportive family to assist with care needs  Initial  goal documentation       Plan:   Telephone follow up appointment with care management team member scheduled for: 01/10/20  Delsa Sale, RN, BSN, CCM Care Management Coordinator [facility]  Direct Phone: [phone]

## 2019-12-17 NOTE — Patient Instructions (Signed)
Visit Information  Goals Addressed    . "he needs a W/C"       CARE PLAN ENTRY (see longitudinal plan of care for additional care plan information)  Current Barriers:  [name] Knowledge Deficits related to how to obtain DME (manual W/C) . Chronic Disease Management support and education needs related to Essential Hypertension, Dementia, Parenchymal renal hypertension, stage 1 through 4, CKD stage 3   . Cognitive Deficits  Nurse Case Manager Clinical Goal(s):  [name] Over the next 30 days, patient will work with PCP and DME supplier to address needs related to DME needs for improved mobility and to help reduce fall risk  Interventions:  . Inter-disciplinary care team collaboration (see longitudinal plan of care) . Evaluation of current treatment plan related to Impaired Physical Mobility/Gait and patient's adherence to plan as established by provider . Determined Mr. Haff will benefit from having a rollator walker for indoor and outdoor use to help with impaired gait and reduce his fall risk . Educated niece Sheralyn Boatman about how to obtain this type of DME from a DME supplier, coordinating MD order with PCP, advised her of next steps  . Collaborated with PCP Dr. Allyne Gee via in basket message regarding patient's need for a rollator walker to be sent to [facility] . Discussed plans with patient for ongoing care management follow up and provided patient with direct contact information for care management team  Patient Self Care Activities:  . Self administers medications as prescribed . Attends all scheduled provider appointments . Calls pharmacy for medication refills . Calls provider office for new concerns or questions . Supportive family to assist with care needs  Initial goal documentation     . "he needs to drink more water"   On track    Sister stated CARE PLAN ENTRY (see longitudinal plan of care for additional care plan information)  Current Barriers:  [name] Knowledge deficit related to  disease process and Self Health management of CKD . Chronic Disease Management support and education needs related to Essential Hypertension, Dementia, Parenchymal renal hypertension, stage 1 through 4, CKD stage 3    Nurse Case Manager Clinical Goal(s):  [name] Over the next 90 days, patient will work with the CCM team and PCP to address needs related to disease education and support to help improve Self Health management of CKD   CCM RN CM Interventions:  12/12/19 call completed with patient's sister [name]  . Inter-disciplinary care team collaboration (see longitudinal plan of care) . Evaluation of current treatment plan related to CKD and patient's adherence to plan as established by provider. [name] Re-education to sister/niece re: stages of Chronic disease; Educated on most recent GFR having improved from previous level's; Provided positive reinforcement provided for making efforts to help improve patient's renal function; Reinforced importance of having patient increase his water intake, and increase his activity as tolerated  . Discussed plans with patient for ongoing care management follow up and provided patient with direct contact information for care management team  Patient Self Care Activities:  . Attends all scheduled provider appointments . Calls pharmacy for medication refills . Calls provider office for new concerns or questions . Supportive family to assist with care needs   Please see past updates related to this goal by clicking on the "Past Updates" button in the selected goal      . "he's having difficulty chewing and swallowing his food"       Sister/niece stated  CARE PLAN ENTRY (see  longitudinal plan of care for additional care plan information)  Current Barriers:  [name] Knowledge Deficits related to evaluation and treatment of dysphagia and risk for aspiration  . Chronic Disease Management support and education needs related to Essential Hypertension, Dementia,  Parenchymal renal hypertension, stage 1 through 4, CKD stage 3   . Cognitive Deficits  Nurse Case Manager Clinical Goal(s):  [name] Over the next 90 days, patient will work with the CCM team and PCP to address needs related to evaluation and treatment of dysphagia to help lower the risk for aspiration and malnutrition  Interventions:  . Inter-disciplinary care team collaboration (see longitudinal plan of care) . Evaluation of current treatment plan related to Impaired chewing and swallowing  and patient's adherence to plan as established by provider. . Provided education to patient re: potential risk for aspiration; Educated on recommendations for aspiration prevention; Educated on s/s suggestive of aspiration and to call the PCP and or CCM RN promptly to report symptoms or concerns  . Collaborated with PCP Dr. Dorothyann Peng via in basket message regarding reported s/s suggestive of dysphagia and difficulty chewing with new dentures, advised of aspiration concerns; advised patient did not complete ST previously ordered; Requested a new referral for ST be sent to [facility] per family's request . Discussed plans with patient for ongoing care management follow up and provided patient with direct contact information for care management team . Provided patient with printed educational materials related to Aspiration prevention   Patient Self Care Activities:  . Self administers medications as prescribed . Attends all scheduled provider appointments . Calls pharmacy for medication refills . Calls provider office for new concerns or questions . Supportive family to assist with care needs  Initial goal documentation       Patient verbalizes understanding of instructions provided today.   Telephone follow up appointment with care management team member scheduled for: 01/10/20  Delsa Sale, RN, BSN, CCM Care Management Coordinator [facility] Care [facility]  Direct  Phone: [phone]

## 2019-12-18 ENCOUNTER — Telehealth: Payer: Self-pay

## 2019-12-18 ENCOUNTER — Other Ambulatory Visit: Payer: Self-pay | Admitting: Internal Medicine

## 2019-12-18 DIAGNOSIS — R1310 Dysphagia, unspecified: Secondary | ICD-10-CM

## 2019-12-18 NOTE — Telephone Encounter (Signed)
The pt's niece Sheralyn Boatman called and said that there is a concern about the pt's difficulty swallowing now that he has his new dentures, she wanted the pt to have speech therapy come out to the home.  Sheralyn Boatman was told that Dr. Allyne Gee can place a referral for speech therapy but that it's no guarantee  that the will come out because the pt's hasn't been evaluated by Dr. Allyne Gee for this.  Sheralyn Boatman said that she would wait until the pt's next appt to discuss the issue more. Sheralyn Boatman also wanted to know if Dr. Allyne Gee can check the pt for a mini stroke and she was told that she would need to take the pt to the ER.

## 2019-12-24 ENCOUNTER — Ambulatory Visit: Payer: Medicare Other

## 2019-12-24 DIAGNOSIS — I10 Essential (primary) hypertension: Secondary | ICD-10-CM | POA: Diagnosis not present

## 2019-12-24 DIAGNOSIS — R1310 Dysphagia, unspecified: Secondary | ICD-10-CM

## 2019-12-24 DIAGNOSIS — F0391 Unspecified dementia with behavioral disturbance: Secondary | ICD-10-CM | POA: Diagnosis not present

## 2019-12-24 DIAGNOSIS — N183 Chronic kidney disease, stage 3 unspecified: Secondary | ICD-10-CM | POA: Diagnosis not present

## 2019-12-24 DIAGNOSIS — R269 Unspecified abnormalities of gait and mobility: Secondary | ICD-10-CM | POA: Diagnosis not present

## 2019-12-24 DIAGNOSIS — N3281 Overactive bladder: Secondary | ICD-10-CM | POA: Diagnosis not present

## 2019-12-24 NOTE — Patient Instructions (Signed)
Social Worker Visit Information  Goals we discussed today:  Goals Addressed            This Visit's Progress   . "he needs a W/C"   On track    [place] (see longitudinal plan of care for additional care plan information)  Current Barriers:  [name] Knowledge Deficits related to how to obtain DME (manual W/C) . Chronic Disease Management support and education needs related to Essential Hypertension, Dementia, Parenchymal renal hypertension, stage 1 through 4, CKD stage 3   . Cognitive Deficits  Nurse Case Manager Clinical Goal(s):  [name] Over the next 30 days, patient will work with PCP and DME supplier to address needs related to DME needs for improved mobility and to help reduce fall risk  CCM SW Interventions: Completed 12/24/19 with [name] . Successful outbound call placed to the patients niece/caregiver to assess goal progression . Determined [name] has been in contact with [employer] regarding delivery of rollator to the patients home o Co-pay has been paid o Rollator is to be delivered "in 5-7 days" . Discussed [name] has noticed the patient "slowing down" during outings and is concerned he may need a wheelchair in the near future to assist with physician appointments and other outings . Encouraged [name] to contact the care management team as needed to assist with coordination of DME needs . Collaboration with RN Care Manager to provide an update on intervention and plan surrounding DME needs  Interventions:  . Inter-disciplinary care team collaboration (see longitudinal plan of care) . Evaluation of current treatment plan related to Impaired Physical Mobility/Gait and patient's adherence to plan as established by provider . Determined Mr. Whitesel will benefit from having a rollator walker for indoor and outdoor use to help with impaired gait and reduce his fall risk . Educated niece [name] about how to obtain this type of DME from a DME supplier,  coordinating MD order with PCP, advised her of next steps  . Collaborated with PCP Dr. Baird Cancer via in basket message regarding patient's need for a rollator walker to be sent to [facility] . Discussed plans with patient for ongoing care management follow up and provided patient with direct contact information for care management team  Patient Self Care Activities:  . Self administers medications as prescribed . Attends all scheduled provider appointments . Calls pharmacy for medication refills . Calls provider office for new concerns or questions . Supportive family to assist with care needs  Please see past updates related to this goal by clicking on the "Past Updates" button in the selected goal      . Assist with ongoing care management and care coordination needs   On track    [place] (see longitudinal plan of care for additional care plan information)  Current Barriers:  . Ongoing chronic conditions including HTN and Dementia . Limited ability to manage own care coordination needs due to limitations from dementia  Social Work Clinical Goal(s):  [name] Over the next 120 days the patient will work with CM team to address ongoing care management concerns. Goal not met due to scheduling limitations surrounding COVID 19 pandemic . New 10/10/19- Over the next 60 days the patient and his caregivers will work with SW to become more knowledgeable of respite resources available to the patient  CCM SW Interventions: Completed 12/24/19 with [name] . Successful outbound call placed to the patients niece/caregiver to assess goal progression . Determined the patient  has been seen recently by his dentist to address concerns with denture use and discuss barriers to eating while using dentures o [name] reports that since the dental appointment the patient has not had any other complaints surrounding denture use or swallowing . Assessed for interventions done to dentures by  dentist o [name] reports no changes were made and that since the patient is responding better to use she is no longer concerned . Encouraged [name] to contact care management team as needed to assist with care coordination needs . Scheduled follow up call over the next month  Completed 12/09/19 with [name] . Successful outbound call placed to [name] to assess goal progression . Determined the patient is experiencing some difficulty swallowing o [name] reports the patient will not eat with dentures in  o The patient is having difficulty swallowing food including applesauce but is not experiencing difficulty with thin liquids o [name] reports she has noted some weight loss as well . Advised [name] the patient would need to be seen by his physician to assess needs and place a referral for home health . Collaboration with patient primary provider Dr. Baird Cancer regarding above information o Informed the patients niece was contacted by Michelle Nasuti, [place] who reports [name] does not want an appointment during the month of August and will bring patient to next scheduled appointment in September . Scheduled follow up call over the next two weeks  Completed 10/31/19 with [name] . Collaboration with Michelle Nasuti, [place] with primary proivder office who reports contact by [facility] requesting follow up on respite care plans for the patient . Successful outbound call placed to the patients niece, [name] to review previous education surrounding respite care resources o Discussed opportunity to enroll patient with [facility]" program - family not interested at this time due to cost o Reviewed the patient recent referral to [facility] caregiver respite program- family has yet to be contacted. SW advised the last communication received from [name] was the family would be contacted over the next several  weeks . Discussed [name] plans to go out of town for a week and is concerned about leaving the patient and his sister alone in the home o The patients other niece also lives in [place] and is available to help as needed Lyn Hollingshead from [facility] plans to check on the patient daily from Tuesday - Friday . Advised [name] there are no resources to assist with checking on the patient next week other than privately hiring a caregiver o [name] reports this is not an options . Provided education on local resources and the capacity in which their "respite" programs are able to offer assistance . Answered questions surrounding Medicare benefit and what it does/ does not cover in relations to respite caregiver assistance . Educated on the [facility] program including services offered and funding  o Obtained verbal consent to refer patient to [facility] to determine if the patient would qualify for services and what the monthly cost would be . Collaboration with Jola Baptist, SW with [facility]  o Referral received from Mrs. Lorel Monaco who reports plans to contact [name]  . Scheduled follow up over the next month  Patient Self Care Activities:  . Attends all scheduled provider appointments . Calls provider office for new concerns or questions . Patients sister verbalizes understanding to contact CM  team with future care management needs  Please see past updates related to this goal by clicking on the "Past Updates" button in the selected goal          Follow Up Plan: SW will follow up with patient by phone over the next month.   [name], BSW, CDP Social Worker, Certified Dementia Practitioner [place] [facility] [phone]

## 2019-12-24 NOTE — Chronic Care Management (AMB) (Signed)
Chronic Care Management    Social Work Follow Up Note  12/24/2019 Name: EUELL SCHIFF MRN: [id number] DOB: 1932/12/18  Catalina Gravel Massing is a 86 y.o. year old male who is a primary care patient of [name], MD. The CCM team was consulted for assistance with care coordination.   Review of patient status, including review of consultants reports, other relevant assessments, and collaboration with appropriate care team members and the patient's provider was performed as part of comprehensive patient evaluation and provision of chronic care management services.    SDOH (Social Determinants of Health) assessments performed: No    Outpatient Encounter Medications as of 12/24/2019  Medication Sig  . acetaminophen (TYLENOL) 500 MG tablet Take 1,000 mg by mouth every 6 (six) hours as needed for moderate pain.  [name] amLODipine (NORVASC) 5 MG tablet TAKE 1 TABLET BY MOUTH EVERY DAY  . Ascorbic Acid (VITAMIN C PO) Take 100 mg by mouth daily.  [name] aspirin EC 81 MG tablet Take 81 mg by mouth every evening.   . Calcium Carbonate Antacid (CALCIUM CARBONATE PO) Take 100 mg by mouth. 3 x weekly  . cholecalciferol (VITAMIN D3) 25 MCG (1000 UT) tablet Take 2,000 Units by mouth every evening.  . diphenhydrAMINE HCl, Sleep, (ZZZQUIL PO) Take 1 tablet by mouth at bedtime as needed.  [name] losartan-hydrochlorothiazide (HYZAAR) 100-25 MG tablet TAKE 1 TABLET BY MOUTH EVERY DAY  . memantine (NAMENDA) 10 MG tablet TAKE 1 TABLET BY MOUTH TWICE A DAY  . tamsulosin (FLOMAX) 0.4 MG CAPS capsule TAKE 1 CAPSULE BY MOUTH EVERY DAY  . vitamin E 200 UNIT capsule Take 200 Units by mouth every evening.   No facility-administered encounter medications on file as of 12/24/2019.     Goals Addressed            This Visit's Progress   . "he needs a W/C"   On track    [place] (see longitudinal plan of care for additional care plan information)  Current Barriers:  [name] Knowledge Deficits related to how to obtain DME  (manual W/C) . Chronic Disease Management support and education needs related to Essential Hypertension, Dementia, Parenchymal renal hypertension, stage 1 through 4, CKD stage 3   . Cognitive Deficits  Nurse Case Manager Clinical Goal(s):  [name] Over the next 30 days, patient will work with PCP and DME supplier to address needs related to DME needs for improved mobility and to help reduce fall risk  CCM SW Interventions: Completed 12/24/19 with [name] . Successful outbound call placed to the patients niece/caregiver to assess goal progression . Determined [name] has been in contact with [place] regarding delivery of rollator to the patients home o Co-pay has been paid o Rollator is to be delivered "in 5-7 days" . Discussed [name] has noticed the patient "slowing down" during outings and is concerned he may need a wheelchair in the near future to assist with physician appointments and other outings . Encouraged [name] to contact the care management team as needed to assist with coordination of DME needs . Collaboration with RN Care Manager to provide an update on intervention and plan surrounding DME needs  Interventions:  . Inter-disciplinary care team collaboration (see longitudinal plan of care) . Evaluation of current treatment plan related to Impaired Physical Mobility/Gait and patient's adherence to plan as established by provider . Determined Mr. Breighner will benefit from having a rollator walker for indoor and outdoor use to help with impaired  gait and reduce his fall risk . Educated niece [name] about how to obtain this type of DME from a DME supplier, coordinating MD order with PCP, advised her of next steps  . Collaborated with PCP Dr. Baird Cancer via in basket message regarding patient's need for a rollator walker to be sent to [place] . Discussed plans with patient for ongoing care management follow up and provided patient with direct contact information for  care management team  Patient Self Care Activities:  . Self administers medications as prescribed . Attends all scheduled provider appointments . Calls pharmacy for medication refills . Calls provider office for new concerns or questions . Supportive family to assist with care needs  Please see past updates related to this goal by clicking on the "Past Updates" button in the selected goal      . Assist with ongoing care management and care coordination needs   On track    [place] (see longitudinal plan of care for additional care plan information)  Current Barriers:  . Ongoing chronic conditions including HTN and Dementia . Limited ability to manage own care coordination needs due to limitations from dementia  Social Work Clinical Goal(s):  [name] Over the next 120 days the patient will work with CM team to address ongoing care management concerns. Goal not met due to scheduling limitations surrounding COVID 19 pandemic . New 10/10/19- Over the next 60 days the patient and his caregivers will work with SW to become more knowledgeable of respite resources available to the patient  CCM SW Interventions: Completed 12/24/19 with [name] . Successful outbound call placed to the patients niece/caregiver to assess goal progression . Determined the patient has been seen recently by his dentist to address concerns with denture use and discuss barriers to eating while using dentures o [name] reports that since the dental appointment the patient has not had any other complaints surrounding denture use or swallowing . Assessed for interventions done to dentures by dentist o [name] reports no changes were made and that since the patient is responding better to use she is no longer concerned . Encouraged [name] to contact care management team as needed to assist with care coordination needs . Scheduled follow up call over the next month  Completed 12/09/19 with [name] . Successful outbound call placed to [name] to assess goal progression . Determined the patient is experiencing some difficulty swallowing o [name] reports the patient will not eat with dentures in  o The patient is having difficulty swallowing food including applesauce but is not experiencing difficulty with thin liquids o [name] reports she has noted some weight loss as well . Advised [name] the patient would need to be seen by his physician to assess needs and place a referral for home health . Collaboration with patient primary provider Dr. Baird Cancer regarding above information o Informed the patients niece was contacted by Michelle Nasuti, [place] who reports [name] does not want an appointment during the month of August and will bring patient to next scheduled appointment in September . Scheduled follow up call over the next two weeks  Completed 10/31/19 with [name] . Collaboration with Michelle Nasuti, [place] with primary proivder office who reports contact by [facility] requesting follow up on respite care plans for the patient . Successful outbound call placed to the patients niece, [name] to review previous education surrounding respite care resources o Discussed opportunity to enroll patient  with [facility]" program - family not interested at this time due to cost o Reviewed the patient recent referral to [facility] caregiver respite program- family has yet to be contacted. SW advised the last communication received from [name] was the family would be contacted over the next several weeks . Discussed [name] plans to go out of town for a week and is concerned about leaving the patient and his sister alone in the home o The patients other niece also lives in [place] and is available to help as needed Lyn Hollingshead from [facility] plans to check on the patient daily from Tuesday -  Friday . Advised [name] there are no resources to assist with checking on the patient next week other than privately hiring a caregiver o [name] reports this is not an options . Provided education on local resources and the capacity in which their "respite" programs are able to offer assistance . Answered questions surrounding Medicare benefit and what it does/ does not cover in relations to respite caregiver assistance . Educated on the [facility] program including services offered and funding  o Obtained verbal consent to refer patient to [facility] to determine if the patient would qualify for services and what the monthly cost would be . Collaboration with Jola Baptist, SW with [facility]  o Referral received from Mrs. Lorel Monaco who reports plans to contact [name]  . Scheduled follow up over the next month  Patient Self Care Activities:  . Attends all scheduled provider appointments . Calls provider office for new concerns or questions . Patients sister verbalizes understanding to contact CM team with future care management needs  Please see past updates related to this goal by clicking on the "Past Updates" button in the selected goal          Follow Up Plan: SW will follow up with patient by phone over the next month.   [name], BSW, CDP Social Worker, Certified Dementia Practitioner [place] [facility] [place] [facility] [phone]  Total time spent performing care coordination and/or care management activities with the patient by phone or face to face = 21 minutes.

## 2020-01-03 ENCOUNTER — Other Ambulatory Visit: Payer: Self-pay | Admitting: Internal Medicine

## 2020-01-05 ENCOUNTER — Other Ambulatory Visit: Payer: Self-pay | Admitting: Diagnostic Neuroimaging

## 2020-01-08 ENCOUNTER — Telehealth: Payer: Self-pay

## 2020-01-09 ENCOUNTER — Ambulatory Visit (INDEPENDENT_AMBULATORY_CARE_PROVIDER_SITE_OTHER): Payer: Medicare Other

## 2020-01-09 ENCOUNTER — Other Ambulatory Visit: Payer: Self-pay

## 2020-01-09 ENCOUNTER — Ambulatory Visit (INDEPENDENT_AMBULATORY_CARE_PROVIDER_SITE_OTHER): Payer: Medicare Other | Admitting: Internal Medicine

## 2020-01-09 ENCOUNTER — Encounter: Payer: Self-pay | Admitting: Internal Medicine

## 2020-01-09 VITALS — BP 140/80 | HR 97 | Temp 98.5°F | Wt 186.6 lb

## 2020-01-09 VITALS — BP 140/80 | HR 97 | Temp 98.6°F | Ht 67.0 in | Wt 186.6 lb

## 2020-01-09 DIAGNOSIS — M4000 Postural kyphosis, site unspecified: Secondary | ICD-10-CM

## 2020-01-09 DIAGNOSIS — F5101 Primary insomnia: Secondary | ICD-10-CM | POA: Diagnosis not present

## 2020-01-09 DIAGNOSIS — Z23 Encounter for immunization: Secondary | ICD-10-CM

## 2020-01-09 DIAGNOSIS — E559 Vitamin D deficiency, unspecified: Secondary | ICD-10-CM

## 2020-01-09 DIAGNOSIS — Z0000 Encounter for general adult medical examination without abnormal findings: Secondary | ICD-10-CM

## 2020-01-09 DIAGNOSIS — L304 Erythema intertrigo: Secondary | ICD-10-CM | POA: Diagnosis not present

## 2020-01-09 DIAGNOSIS — I10 Essential (primary) hypertension: Secondary | ICD-10-CM

## 2020-01-09 DIAGNOSIS — R269 Unspecified abnormalities of gait and mobility: Secondary | ICD-10-CM

## 2020-01-09 LAB — POCT UA - MICROALBUMIN
Creatinine, POC: 100 mg/dL
Microalbumin Ur, POC: 80 mg/L

## 2020-01-09 LAB — POCT URINALYSIS DIPSTICK
Bilirubin, UA: NEGATIVE
Glucose, UA: NEGATIVE
Ketones, UA: NEGATIVE
Leukocytes, UA: NEGATIVE
Nitrite, UA: NEGATIVE
Protein, UA: POSITIVE — AB
Spec Grav, UA: 1.02 (ref 1.010–1.025)
Urobilinogen, UA: 0.2 E.U./dL
pH, UA: 7.5 (ref 5.0–8.0)

## 2020-01-09 MED ORDER — BOOSTRIX 5-2.5-18.5 LF-MCG/0.5 IM SUSP: 0.5000 mL | Freq: Once | INTRAMUSCULAR | 0 refills | Status: AC

## 2020-01-09 MED ORDER — PREVNAR 13 IM SUSP: 0.5000 mL | INTRAMUSCULAR | 0 refills | Status: AC

## 2020-01-09 NOTE — Patient Instructions (Signed)
Manuel [name] , Thank you for taking time to come for your Medicare Wellness Visit. I appreciate your ongoing commitment to your health goals. Please review the following plan we discussed and let me know if I can assist you in the future.   Screening recommendations/referrals: Colonoscopy: not required Recommended yearly ophthalmology/optometry visit for glaucoma screening and checkup Recommended yearly dental visit for hygiene and checkup  Vaccinations: Influenza vaccine: today Pneumococcal vaccine: sent to pharmacy Tdap vaccine: sent to pharmacy Shingles vaccine: discussed   Covid-19:  07/17/2019, 06/21/2019  Advanced directives: copy in chart  Conditions/risks identified: none  Next appointment: 07/09/2020 at 2:15 Follow up in one year for your annual wellness visit.   Preventive Care 84 Years and Older, Male Preventive care refers to lifestyle choices and visits with your health care provider that can promote health and wellness. What does preventive care include?  A yearly physical exam. This is also called an annual well check.  Dental exams once or twice a year.  Routine eye exams. Ask your health care provider how often you should have your eyes checked.  Personal lifestyle choices, including:  Daily care of your teeth and gums.  Regular physical activity.  Eating a healthy diet.  Avoiding tobacco and drug use.  Limiting alcohol use.  Practicing safe sex.  Taking low doses of aspirin every day.  Taking vitamin and mineral supplements as recommended by your health care provider. What happens during an annual well check? The services and screenings done by your health care provider during your annual well check will depend on your age, overall health, lifestyle risk factors, and family history of disease. Counseling  Your health care provider may ask you questions about your:  Alcohol use.  Tobacco use.  Drug use.  Emotional well-being.  Home and  relationship well-being.  Sexual activity.  Eating habits.  History of falls.  Memory and ability to understand (cognition).  Work and work Astronomer. Screening  You may have the following tests or measurements:  Height, weight, and BMI.  Blood pressure.  Lipid and cholesterol levels. These may be checked every 5 years, or more frequently if you are over 22 years old.  Skin check.  Lung cancer screening. You may have this screening every year starting at age 34 if you have a 30-pack-year history of smoking and currently smoke or have quit within the past 15 years.  Fecal occult blood test (FOBT) of the stool. You may have this test every year starting at age 20.  Flexible sigmoidoscopy or colonoscopy. You may have a sigmoidoscopy every 5 years or a colonoscopy every 10 years starting at age 33.  Prostate cancer screening. Recommendations will vary depending on your family history and other risks.  Hepatitis C blood test.  Hepatitis B blood test.  Sexually transmitted disease (STD) testing.  Diabetes screening. This is done by checking your blood sugar (glucose) after you have not eaten for a while (fasting). You may have this done every 1-3 years.  Abdominal aortic aneurysm (AAA) screening. You may need this if you are a current or former smoker.  Osteoporosis. You may be screened starting at age 50 if you are at high risk. Talk with your health care provider about your test results, treatment options, and if necessary, the need for more tests. Vaccines  Your health care provider may recommend certain vaccines, such as:  Influenza vaccine. This is recommended every year.  Tetanus, diphtheria, and acellular pertussis (Tdap, Td) vaccine. You may need  a Td booster every 10 years.  Zoster vaccine. You may need this after age 75.  Pneumococcal 13-valent conjugate (PCV13) vaccine. One dose is recommended after age 5.  Pneumococcal polysaccharide (PPSV23) vaccine. One  dose is recommended after age 80. Talk to your health care provider about which screenings and vaccines you need and how often you need them. This information is not intended to replace advice given to you by your health care provider. Make sure you discuss any questions you have with your health care provider. Document Released: 05/08/2015 Document Revised: 12/30/2015 Document Reviewed: 02/10/2015 Elsevier Interactive Patient Education  2017 [place] Prevention in the Home Falls can cause injuries. They can happen to people of all ages. There are many things you can do to make your home safe and to help prevent falls. What can I do on the outside of my home?  Regularly fix the edges of walkways and driveways and fix any cracks.  Remove anything that might make you trip as you walk through a door, such as a raised step or threshold.  Trim any bushes or trees on the path to your home.  Use bright outdoor lighting.  Clear any walking paths of anything that might make someone trip, such as rocks or tools.  Regularly check to see if handrails are loose or broken. Make sure that both sides of any steps have handrails.  Any raised decks and porches should have guardrails on the edges.  Have any leaves, snow, or ice cleared regularly.  Use sand or salt on walking paths during winter.  Clean up any spills in your garage right away. This includes oil or grease spills. What can I do in the bathroom?  Use night lights.  Install grab bars by the toilet and in the tub and shower. Do not use towel bars as grab bars.  Use non-skid mats or decals in the tub or shower.  If you need to sit down in the shower, use a plastic, non-slip stool.  Keep the floor dry. Clean up any water that spills on the floor as soon as it happens.  Remove soap buildup in the tub or shower regularly.  Attach bath mats securely with double-sided non-slip rug tape.  Do not have throw rugs and other  things on the floor that can make you trip. What can I do in the bedroom?  Use night lights.  Make sure that you have a light by your bed that is easy to reach.  Do not use any sheets or blankets that are too big for your bed. They should not hang down onto the floor.  Have a firm chair that has side arms. You can use this for support while you get dressed.  Do not have throw rugs and other things on the floor that can make you trip. What can I do in the kitchen?  Clean up any spills right away.  Avoid walking on wet floors.  Keep items that you use a lot in easy-to-reach places.  If you need to reach something above you, use a strong step stool that has a grab bar.  Keep electrical cords out of the way.  Do not use floor polish or wax that makes floors slippery. If you must use wax, use non-skid floor wax.  Do not have throw rugs and other things on the floor that can make you trip. What can I do with my stairs?  Do not leave any items on the  stairs.  Make sure that there are handrails on both sides of the stairs and use them. Fix handrails that are broken or loose. Make sure that handrails are as long as the stairways.  Check any carpeting to make sure that it is firmly attached to the stairs. Fix any carpet that is loose or worn.  Avoid having throw rugs at the top or bottom of the stairs. If you do have throw rugs, attach them to the floor with carpet tape.  Make sure that you have a light switch at the top of the stairs and the bottom of the stairs. If you do not have them, ask someone to add them for you. What else can I do to help prevent falls?  Wear shoes that:  Do not have high heels.  Have rubber bottoms.  Are comfortable and fit you well.  Are closed at the toe. Do not wear sandals.  If you use a stepladder:  Make sure that it is fully opened. Do not climb a closed stepladder.  Make sure that both sides of the stepladder are locked into place.  Ask  someone to hold it for you, if possible.  Clearly mark and make sure that you can see:  Any grab bars or handrails.  First and last steps.  Where the edge of each step is.  Use tools that help you move around (mobility aids) if they are needed. These include:  Canes.  Walkers.  Scooters.  Crutches.  Turn on the lights when you go into a dark area. Replace any light bulbs as soon as they burn out.  Set up your furniture so you have a clear path. Avoid moving your furniture around.  If any of your floors are uneven, fix them.  If there are any pets around you, be aware of where they are.  Review your medicines with your doctor. Some medicines can make you feel dizzy. This can increase your chance of falling. Ask your doctor what other things that you can do to help prevent falls. This information is not intended to replace advice given to you by your health care provider. Make sure you discuss any questions you have with your health care provider. Document Released: 02/05/2009 Document Revised: 09/17/2015 Document Reviewed: 05/16/2014 Elsevier Interactive Patient Education  2017 [employer].

## 2020-01-09 NOTE — Progress Notes (Signed)
This visit occurred during the SARS-CoV-2 public health emergency.  Safety protocols were in place, including screening questions prior to the visit, additional usage of staff PPE, and extensive cleaning of exam room while observing appropriate contact time as indicated for disinfecting solutions.  Subjective:   Manuel [name] is a 86 y.o. male who presents for Medicare Annual/Subsequent preventive examination.  Review of Systems     Cardiac Risk Factors include: advanced age (>69mn, >>41women);hypertension;male gender;sedentary lifestyle     Objective:    Today's Vitals   01/09/20 1412  BP: 140/80  Pulse: 97  Temp: 98.5 F (36.9 C)  TempSrc: Oral  SpO2: 98%  Weight: 186 lb 9.6 oz (84.6 kg)   Body mass index is 29.23 kg/m.  Advanced Directives 01/09/2020 12/26/2018 07/04/2018 06/24/2018 06/24/2018 12/11/2017  Does Patient Have a Medical Advance Directive? Yes Yes No No No No  Type of Advance Directive Out of facility DNR (pink MOST or yellow form) Manuel [name] will - - - -  Copy of [place] Chart? - No - copy requested - - - -  Would patient like information on creating a medical advance directive? - - Yes (MAU/Ambulatory/Procedural Areas - Information given) No - Patient declined No - Patient declined Yes (MAU/Ambulatory/Procedural Areas - Information given)    Current Medications (verified) Outpatient Encounter Medications as of 01/09/2020  Medication Sig  . acetaminophen (TYLENOL) 500 MG tablet Take 1,000 mg by mouth every 6 (six) hours as needed for moderate pain.  .Marland KitchenamLODipine (NORVASC) 5 MG tablet TAKE 1 TABLET BY MOUTH EVERY DAY  . Ascorbic Acid (VITAMIN C PO) Take 100 mg by mouth daily.  .Marland Kitchenaspirin EC 81 MG tablet Take 81 mg by mouth every evening.   . Calcium Carbonate Antacid (CALCIUM CARBONATE PO) Take 100 mg by mouth. 3 x weekly  . cholecalciferol (VITAMIN D3) 25 MCG (1000 UT) tablet Take 2,000 Units by mouth every evening.    . diphenhydrAMINE HCl, Sleep, (ZZZQUIL PO) Take 1 tablet by mouth at bedtime as needed.  .Marland Kitchenlosartan-hydrochlorothiazide (HYZAAR) 100-25 MG tablet TAKE 1 TABLET BY MOUTH EVERY DAY  . memantine (NAMENDA) 10 MG tablet TAKE 1 TABLET BY MOUTH TWICE A DAY  . tamsulosin (FLOMAX) 0.4 MG CAPS capsule TAKE 1 CAPSULE BY MOUTH EVERY DAY  . vitamin E 200 UNIT capsule Take 200 Units by mouth every evening.  . pneumococcal 13-valent conjugate vaccine (PREVNAR 13) SUSP injection Inject 0.5 mLs into the muscle tomorrow at 10 am for 1 dose.  . Tdap (BOOSTRIX) 5-2.5-18.5 LF-MCG/0.5 injection Inject 0.5 mLs into the muscle once for 1 dose.   No facility-administered encounter medications on file as of 01/09/2020.    Allergies (verified) Penicillins   History: Past Medical History:  Diagnosis Date  . Alzheimer's dementia [place]   . Hypertension   . Prostate disorder    Past Surgical History:  Procedure Laterality Date  . TOTAL HIP ARTHROPLASTY     Family History  Problem Relation Age of Onset  . Early death Mother   . Prostate cancer Father    Social History   Socioeconomic History  . Marital status: Single    Spouse name: Not on file  . Number of children: 0  . Years of education: Not on file  . Highest education level: 7th grade  Occupational History  . Occupation: retired    Comment: retiredd  Tobacco Use  . Smoking status: Former Smoker    Years: 2.00  .  Smokeless tobacco: Never Used  Vaping Use  . Vaping Use: Never used  Substance and Sexual Activity  . Alcohol use: No  . Drug use: No  . Sexual activity: Not Currently  Other Topics Concern  . Not on file  Social History Narrative   11/26/18 lives w/youngest sister and niece, Manuel [name]    Social Determinants of Health   Financial Resource Strain: Low Risk   . Difficulty of Paying Living Expenses: Not hard at all  Food Insecurity: No Food Insecurity  . Worried About Charity fundraiser in the Last Year: Never true  . Ran Out of  Food in the Last Year: Never true  Transportation Needs: No Transportation Needs  . Lack of Transportation (Medical): No  . Lack of Transportation (Non-Medical): No  Physical Activity: Inactive  . Days of Exercise per Week: 0 days  . Minutes of Exercise per Session: 0 min  Stress: Stress Concern Present  . Feeling of Stress : To some extent  Social Connections:   . Frequency of Communication with Friends and Family: Not on file  . Frequency of Social Gatherings with Friends and Family: Not on file  . Attends Religious Services: Not on file  . Active Member of Clubs or Organizations: Not on file  . Attends Archivist Meetings: Not on file  . Marital Status: Not on file    Tobacco Counseling Counseling given: Not Answered   Clinical Intake:  Pre-visit preparation completed: Yes  Pain : No/denies pain     Nutritional Status: BMI 25 -29 Overweight Nutritional Risks: None Diabetes: No     Diabetic? no  Interpreter Needed?: No  Information entered by :: NAllen LPN   Activities of Daily Living In your present state of health, do you have any difficulty performing the following activities: 01/09/2020  Hearing? Y  Comment a little bit  Vision? Y  Comment kind of blurry  Difficulty concentrating or making decisions? Y  Walking or climbing stairs? N  Dressing or bathing? N  Doing errands, shopping? Y  Preparing Food and eating ? Y  Using the Toilet? N  In the past six months, have you accidently leaked urine? Y  Comment wears depends  Do you have problems with loss of bowel control? N  Managing your Medications? Y  Managing your Finances? Y  Housekeeping or managing your Housekeeping? Y  Some recent data might be hidden    Patient Care Team: [name], MD as PCP - General (Internal Medicine) Rex Kras, Claudette Stapler, RN as Case Manager [name] as Social Worker  Indicate any recent Medical Services you may have received from other than [facility]  providers in the past year (date may be approximate).     Assessment:   This is a routine wellness examination for Manuel [name].  Hearing/Vision screen  Hearing Screening   125Hz 250Hz 500Hz 1000Hz 2000Hz 3000Hz 4000Hz 6000Hz 8000Hz  Right ear:           Left ear:           Vision Screening Comments: Regular eye exams, Dr. Felipa Emory  Dietary issues and exercise activities discussed: Current Exercise Habits: The patient does not participate in regular exercise at present  Goals    . "he needs a W/C"     CARE PLAN ENTRY (see longitudinal plan of care for additional care plan information)  Current Barriers:  [name] Knowledge Deficits related to how to obtain DME (manual W/C) . Chronic Disease Management support and education  needs related to Essential Hypertension, Dementia, Parenchymal renal hypertension, stage 1 through 4, CKD stage 3   . Cognitive Deficits  Nurse Case Manager Clinical Goal(s):  [name] Over the next 30 days, patient will work with PCP and DME supplier to address needs related to DME needs for improved mobility and to help reduce fall risk  CCM SW Interventions: Completed 12/24/19 with [name] . Successful outbound call placed to the patients niece/caregiver to assess goal progression . Determined [name] has been in contact with [place] regarding delivery of rollator to the patients home o Co-pay has been paid o Rollator is to be delivered "in 5-7 days" . Discussed [name] has noticed the patient "slowing down" during outings and is concerned he may need a wheelchair in the near future to assist with physician appointments and other outings . Encouraged [name] to contact the care management team as needed to assist with coordination of DME needs . Collaboration with RN Care Manager to provide an update on intervention and plan surrounding DME needs  Interventions:  . Inter-disciplinary care team collaboration (see longitudinal plan of care) . Evaluation of  current treatment plan related to Impaired Physical Mobility/Gait and patient's adherence to plan as established by provider . Determined Mr. Counsell will benefit from having a rollator walker for indoor and outdoor use to help with impaired gait and reduce his fall risk . Educated niece Manuel [name] about how to obtain this type of DME from a DME supplier, coordinating MD order with PCP, advised her of next steps  . Collaborated with PCP Dr. Baird Cancer via in basket message regarding patient's need for a rollator walker to be sent to [place] . Discussed plans with patient for ongoing care management follow up and provided patient with direct contact information for care management team  Patient Self Care Activities:  . Self administers medications as prescribed . Attends all scheduled provider appointments . Calls pharmacy for medication refills . Calls provider office for new concerns or questions . Supportive family to assist with care needs  Please see past updates related to this goal by clicking on the "Past Updates" button in the selected goal      . "he needs to drink more water"     Sister stated Lucky (see longitudinal plan of care for additional care plan information)  Current Barriers:  [name] Knowledge deficit related to disease process and Self Health management of CKD . Chronic Disease Management support and education needs related to Essential Hypertension, Dementia, Parenchymal renal hypertension, stage 1 through 4, CKD stage 3    Nurse Case Manager Clinical Goal(s):  [name] Over the next 90 days, patient will work with the CCM team and PCP to address needs related to disease education and support to help improve Self Health management of CKD   CCM RN CM Interventions:  12/12/19 call completed with patient's sister Rondell Reams  . Inter-disciplinary care team collaboration (see longitudinal plan of care) . Evaluation of current treatment plan related to CKD and patient's  adherence to plan as established by provider. [name] Re-education to sister/niece re: stages of Chronic disease; Educated on most recent GFR having improved from previous level's; Provided positive reinforcement provided for making efforts to help improve patient's renal function; Reinforced importance of having patient increase his water intake, and increase his activity as tolerated  . Discussed plans with patient for ongoing care management follow up and provided patient with direct contact information for care management  team  Patient Self Care Activities:  . Attends all scheduled provider appointments . Calls pharmacy for medication refills . Calls provider office for new concerns or questions . Supportive family to assist with care needs   Please see past updates related to this goal by clicking on the "Past Updates" button in the selected goal      . "he's having difficulty chewing and swallowing his food"     Sister/niece stated  CARE PLAN ENTRY (see longitudinal plan of care for additional care plan information)  Current Barriers:  [name] Knowledge Deficits related to evaluation and treatment of dysphagia and risk for aspiration  . Chronic Disease Management support and education needs related to Essential Hypertension, Dementia, Parenchymal renal hypertension, stage 1 through 4, CKD stage 3   . Cognitive Deficits  Nurse Case Manager Clinical Goal(s):  [name] Over the next 90 days, patient will work with the CCM team and PCP to address needs related to evaluation and treatment of dysphagia to help lower the risk for aspiration and malnutrition  Interventions:  . Inter-disciplinary care team collaboration (see longitudinal plan of care) . Evaluation of current treatment plan related to Impaired chewing and swallowing  and patient's adherence to plan as established by provider. . Provided education to patient re: potential risk for aspiration; Educated on recommendations for aspiration  prevention; Educated on s/s suggestive of aspiration and to call the PCP and or CCM RN promptly to report symptoms or concerns  . Collaborated with PCP Dr. [name] via in basket message regarding reported s/s suggestive of dysphagia and difficulty chewing with new dentures, advised of aspiration concerns; advised patient did not complete ST previously ordered; Requested a new referral for ST be sent to [facility] home per family's request . Discussed plans with patient for ongoing care management follow up and provided patient with direct contact information for care management team . Provided patient with printed educational materials related to Aspiration prevention   Patient Self Care Activities:  . Self administers medications as prescribed . Attends all scheduled provider appointments . Calls pharmacy for medication refills . Calls provider office for new concerns or questions . Supportive family to assist with care needs  Initial goal documentation     . Assist with ongoing care management and care coordination needs     CARE PLAN ENTRY (see longitudinal plan of care for additional care plan information)  Current Barriers:  . Ongoing chronic conditions including HTN and Dementia . Limited ability to manage own care coordination needs due to limitations from dementia  Social Work Clinical Goal(s):  [name] Over the next 120 days the patient will work with CM team to address ongoing care management concerns. Goal not met due to scheduling limitations surrounding COVID 19 pandemic . New 10/10/19- Over the next 60 days the patient and his caregivers will work with SW to become more knowledgeable of respite resources available to the patient  CCM SW Interventions: Completed 12/24/19 with [name] . Successful outbound call placed to the patients niece/caregiver to assess goal progression . Determined the patient has been seen recently by his dentist to address concerns with denture  use and discuss barriers to eating while using dentures o [name] reports that since the dental appointment the patient has not had any other complaints surrounding denture use or swallowing . Assessed for interventions done to dentures by dentist o [name] reports no changes were made and that since the patient is responding better to use  she is no longer concerned . Encouraged [name] to contact care management team as needed to assist with care coordination needs . Scheduled follow up call over the next month  Completed 12/09/19 with [name] . Successful outbound call placed to [name] to assess goal progression . Determined the patient is experiencing some difficulty swallowing o [name] reports the patient will not eat with dentures in  o The patient is having difficulty swallowing food including applesauce but is not experiencing difficulty with thin liquids o [name] reports she has noted some weight loss as well . Advised [name] the patient would need to be seen by his physician to assess needs and place a referral for home health . Collaboration with patient primary provider Dr. Baird Cancer regarding above information o Informed the patients niece was contacted by Michelle Nasuti, [place] who reports [name] does not want an appointment during the month of August and will bring patient to next scheduled appointment in September . Scheduled follow up call over the next two weeks  Completed 10/31/19 with [name] . Collaboration with Michelle Nasuti, [place] with primary proivder office who reports contact by [facility] requesting follow up on respite care plans for the patient . Successful outbound call placed to the patients niece, [name] to review previous education surrounding respite care resources o Discussed opportunity to enroll patient with [facility]" program - family not interested at this time due to  cost o Reviewed the patient recent referral to [facility] caregiver respite program- family has yet to be contacted. SW advised the last communication received from [name] was the family would be contacted over the next several weeks . Discussed [name] plans to go out of town for a week and is concerned about leaving the patient and his sister alone in the home o The patients other niece also lives in [place] and is available to help as needed Lyn Hollingshead from [facility] plans to check on the patient daily from Tuesday - Friday . Advised [name] there are no resources to assist with checking on the patient next week other than privately hiring a caregiver o [name] reports this is not an options . Provided education on local resources and the capacity in which their "respite" programs are able to offer assistance . Answered questions surrounding Medicare benefit and what it does/ does not cover in relations to respite caregiver assistance . Educated on the [facility] program including services offered and funding  o Obtained verbal consent to refer patient to [facility] to determine if the patient would qualify for services and what the monthly cost would be . Collaboration with Jola Baptist, SW with [facility]  o Referral received from Mrs. Lorel Monaco who reports plans to contact [name]  . Scheduled follow up over the next month  Patient Self Care Activities:  . Attends all scheduled provider appointments . Calls provider office for new concerns or questions . Patients sister verbalizes understanding to contact CM team with future care management needs  Please see past updates related to this goal by clicking on the "Past Updates" button in the selected goal      . Patient Stated     01/09/2020, niece states needs to sleep better at night      Depression Screen PHQ 2/9 Scores 01/09/2020 06/25/2019 12/26/2018 06/29/2018  12/11/2017  PHQ - 2 Score 0 0 0  0 -  Exception Documentation - - Other- indicate reason in comment box - Other- indicate reason in comment box  Not completed - - just completed by CMA - call completed with sister    Fall Risk Fall Risk  01/09/2020 06/25/2019 12/26/2018 12/26/2018 11/26/2018  Falls in the past year? 0 0 0 0 0  Number falls in past yr: - 0 - - -  Injury with Fall? - 0 - - -  Risk for fall due to : Impaired balance/gait;Medication side effect;Impaired mobility - Medication side effect;Impaired balance/gait - -  Follow up Falls evaluation completed;Education provided;Falls prevention discussed - Falls evaluation completed;Education provided;Falls prevention discussed - -    Any stairs in or around the home? Yes  If so, are there any without handrails? No  Home free of loose throw rugs in walkways, pet beds, electrical cords, etc? Yes  Adequate lighting in your home to reduce risk of falls? Yes   ASSISTIVE DEVICES UTILIZED TO PREVENT FALLS:  Life alert? Yes  Use of a cane, walker or w/c? Yes  Grab bars in the bathroom? Yes  Shower chair or bench in shower? Yes  Elevated toilet seat or a handicapped toilet? No   TIMED UP AND GO:  Was the test performed? No .  Wheeled on rollato  Cognitive Function: MMSE - Mini Mental State Exam 11/26/2018  Orientation to time 1  Orientation to Place 4  Registration 3  Attention/ Calculation 0  Recall 0  Language- name 2 objects 2  Language- repeat 0  Language- follow 3 step command 3  Language- read & follow direction 1  Write a sentence 0  Copy design 0  Total score 14        Immunizations Immunization History  Administered Date(s) Administered  . Fluad Quad(high Dose 65+) 01/09/2020  . Influenza, High Dose Seasonal PF 12/26/2018  . PFIZER SARS-COV-2 Vaccination 06/21/2019, 07/17/2019    TDAP status: sent to pharmacy Flu Vaccine status: Completed at today's visit Pneumococcal vaccine status: sent to pharmacy Covid-19  vaccine status: Completed vaccines  Qualifies for Shingles Vaccine? Yes   Zostavax completed Yes   Shingrix Completed?: No.    Education has been provided regarding the importance of this vaccine. Patient has been advised to call insurance company to determine out of pocket expense if they have not yet received this vaccine. Advised may also receive vaccine at local pharmacy or Health Dept. Verbalized acceptance and understanding.  Screening Tests Health Maintenance  Topic Date Due  . TETANUS/TDAP  Never done  . PNA vac Low Risk Adult (1 of 2 - PCV13) Never done  . INFLUENZA VACCINE  Completed  . COVID-19 Vaccine  Completed    Health Maintenance  Health Maintenance Due  Topic Date Due  . TETANUS/TDAP  Never done  . PNA vac Low Risk Adult (1 of 2 - PCV13) Never done    Colorectal cancer screening: No longer required.   Lung Cancer Screening: (Low Dose CT Chest recommended if Age 14-80 years, 30 pack-year currently smoking OR have quit w/in 15years.) does not qualify.   Lung Cancer Screening Referral: no  Additional Screening:  Hepatitis C Screening: does not qualify;   Vision Screening: Recommended annual ophthalmology exams for early detection of glaucoma and other disorders of the eye. Is the patient up to date with their annual eye exam?  Yes  Who is the provider or what is the name of the office in which the patient attends annual eye exams? Dr.  Luni If pt is not established with a provider, would they like to be referred to a provider to establish care? No .   Dental Screening: Recommended annual dental exams for proper oral hygiene  Community Resource Referral / Chronic Care Management: CRR required this visit?  No   CCM required this visit?  No      Plan:     I have personally reviewed and noted the following in the patient's chart:   . Medical and social history . Use of alcohol, tobacco or illicit drugs  . Current medications and supplements . Functional  ability and status . Nutritional status . Physical activity . Advanced directives . List of other physicians . Hospitalizations, surgeries, and ER visits in previous 12 months . Vitals . Screenings to include cognitive, depression, and falls . Referrals and appointments  In addition, I have reviewed and discussed with patient certain preventive protocols, quality metrics, and best practice recommendations. A written personalized care plan for preventive services as well as general preventive health recommendations were provided to patient.     Kellie Simmering, LPN   1/66/0630   Nurse Notes: 6 CIT not performed. Patient has diagnosis of dementia.

## 2020-01-09 NOTE — Patient Instructions (Signed)

## 2020-01-09 NOTE — Progress Notes (Signed)
I,Manuel [name],acting as a Education administrator for Manuel Greenland, MD.,have documented all relevant documentation on the behalf of Manuel Greenland, MD,as directed by  Manuel Greenland, MD while in the presence of Manuel Greenland, MD.  This visit occurred during the SARS-CoV-2 public health emergency.  Safety protocols were in place, including screening questions prior to the visit, additional usage of staff PPE, and extensive cleaning of exam room while observing appropriate contact time as indicated for disinfecting solutions.  Subjective:     Patient ID: Manuel [name] , male    DOB: April 09, 1933 , 86 y.o.   MRN: [id number]   Chief Complaint  Patient presents with  . Annual Exam  . Hypertension    HPI  The patient is here today for a physical examination. He is brought in today by his niece. She reports he is compliant with meds; however, he is having issues with sleep at night.  Hypertension This is a chronic problem. The current episode started more than 1 year ago. The problem has been gradually improving since onset. The problem is controlled. Pertinent negatives include no blurred vision, chest pain, palpitations or shortness of breath. Risk factors for coronary artery disease include male gender and sedentary lifestyle.     Past Medical History:  Diagnosis Date  . Alzheimer's dementia ([place])   . Hypertension   . Prostate disorder      Family History  Problem Relation Age of Onset  . Early death Mother   . Prostate cancer Father      Current Outpatient Medications:  .  acetaminophen (TYLENOL) 500 MG tablet, Take 1,000 mg by mouth every 6 (six) hours as needed for moderate pain., Disp: , Rfl:  .  amLODipine (NORVASC) 5 MG tablet, TAKE 1 TABLET BY MOUTH EVERY DAY, Disp: 90 tablet, Rfl: 3 .  Ascorbic Acid (VITAMIN C PO), Take 100 mg by mouth daily., Disp: , Rfl:  .  aspirin EC 81 MG tablet, Take 81 mg by mouth every evening. , Disp: , Rfl:  .  Calcium Carbonate Antacid (CALCIUM  CARBONATE PO), Take 100 mg by mouth. 3 x weekly, Disp: , Rfl:  .  cholecalciferol (VITAMIN D3) 25 MCG (1000 UT) tablet, Take 2,000 Units by mouth every evening., Disp: , Rfl:  .  diphenhydrAMINE HCl, Sleep, (ZZZQUIL PO), Take 1 tablet by mouth at bedtime as needed., Disp: , Rfl:  .  losartan-hydrochlorothiazide (HYZAAR) 100-25 MG tablet, TAKE 1 TABLET BY MOUTH EVERY DAY, Disp: 90 tablet, Rfl: 1 .  memantine (NAMENDA) 10 MG tablet, TAKE 1 TABLET BY MOUTH TWICE A DAY, Disp: 180 tablet, Rfl: 3 .  QUEtiapine (SEROQUEL) 25 MG tablet, Take half tablet at bedtime., Disp: 30 tablet, Rfl: 1 .  tamsulosin (FLOMAX) 0.4 MG CAPS capsule, TAKE 1 CAPSULE BY MOUTH EVERY DAY, Disp: 90 capsule, Rfl: 1 .  vitamin E 200 UNIT capsule, Take 200 Units by mouth every evening., Disp: , Rfl:    Allergies  Allergen Reactions  . Penicillins Swelling    Did it involve swelling of the face/tongue/throat, SOB, or low BP? Yes Did it involve sudden or severe rash/hives, skin peeling, or any reaction on the inside of your mouth or nose? No Did you need to seek medical attention at a hospital or doctor's office? No When did it last happen?2013 If all above answers are "NO", may proceed with cephalosporin use.      Men's preventive visit. Patient Health Questionnaire (PHQ-2) is    Clinical Support  from 01/09/2020 in [facility]  PHQ-2 Total Score 0    . Patient is on a healthy diet. Marital status: Single. Relevant history for alcohol use is:  Social History   Substance and Sexual Activity  Alcohol Use No  . Relevant history for tobacco use is:  Social History   Tobacco Use  Smoking Status Former Smoker  . Years: 2.00  Smokeless Tobacco Never Used  .   Review of Systems  Constitutional: Negative.   HENT: Negative.   Eyes: Negative.  Negative for blurred vision.  Respiratory: Negative.  Negative for shortness of breath.   Cardiovascular: Negative.  Negative for chest pain and  palpitations.  Gastrointestinal: Negative.   Endocrine: Negative.   Genitourinary: Negative.   Musculoskeletal: Negative.   Skin: Negative.   Allergic/Immunologic: Negative.   Neurological: Negative.   Hematological: Negative.   Psychiatric/Behavioral: Positive for sleep disturbance.     Today's Vitals   01/09/20 1438  BP: 140/80  Pulse: 97  Temp: 98.6 F (37 C)  TempSrc: Oral  Weight: 186 lb 9.6 oz (84.6 kg)  Height: _0  (1.702 m)   Body mass index is 29.23 kg/m.   Objective:  Physical Exam Vitals and nursing note reviewed.  Constitutional:      Appearance: Normal appearance. He is obese.  HENT:     Head: Normocephalic and atraumatic.     Right Ear: Tympanic membrane, ear canal and external ear normal.     Left Ear: Tympanic membrane, ear canal and external ear normal.     Nose:     Comments: Deferred, masked    Mouth/Throat:     Comments: Deferred, masked Eyes:     Extraocular Movements: Extraocular movements intact.     Pupils: Pupils are equal, round, and reactive to light.  Cardiovascular:     Rate and Rhythm: Normal rate and regular rhythm.     Heart sounds: Normal heart sounds.  Pulmonary:     Effort: Pulmonary effort is normal.     Breath sounds: Normal breath sounds.  Abdominal:     General: Bowel sounds are normal.     Palpations: Abdomen is soft.  Genitourinary:    Comments: Deferred  Musculoskeletal:     Cervical back: Normal range of motion.     Right lower leg: Edema present.     Left lower leg: Edema present.     Comments: kyphosis  Skin:    General: Skin is warm.     Comments: Areas of erythema underneath both breasts. No vesicular lesions noted.   Neurological:     General: No focal deficit present.     Mental Status: He is alert. He is disoriented.     Gait: Gait abnormal.  Psychiatric:        Mood and Affect: Mood normal.        Behavior: Behavior normal.         Assessment And Plan:    1. Routine general medical  examination at a health care facility Comments: A full exam was performed.  DRE declined, he is also followed by Urology. PATIENT IS ADVISED TO GET 30-45 MINUTES REGULAR EXERCISE NO LESS THAN FOUR TO FIVE DAYS PER WEEK - BOTH WEIGHTBEARING EXERCISES AND AEROBIC ARE RECOMMENDED.  PATIENT IS ADVISED TO FOLLOW A HEALTHY DIET WITH AT LEAST SIX FRUITS/VEGGIES PER DAY, DECREASE INTAKE OF RED MEAT, AND TO INCREASE FISH INTAKE TO TWO DAYS PER WEEK.  MEATS/FISH SHOULD NOT BE FRIED, BAKED OR BROILED  IS PREFERABLE.  I SUGGEST WEARING SPF 50 SUNSCREEN ON EXPOSED PARTS AND ESPECIALLY WHEN IN THE DIRECT SUNLIGHT FOR AN EXTENDED PERIOD OF TIME.  PLEASE AVOID FAST FOOD RESTAURANTS AND INCREASE YOUR WATER INTAKE.   2. Essential hypertension, benign Comments: Chronic, fair control. He will continue with current meds.He is encouraged to follow a low sodium diet. EKG performed, NSR w/ first degree AV block. He will rto in six months for re-evaluation.  - EKG 12-Lead - CBC - CMP14+EGFR - Lipid panel  3. Intertrigo Comments: Advised to use Gold Bond powder twice daily. His niece will let me know if there is no improvement in his sx.   4. Kyphosis (acquired) (postural) Comments: Chronic.   5. Primary insomnia Comments: He was given samples of Belsomra to use nightly as needed. If not helpful, will consider another medication.  6. Gait abnormality Comments: Also with UE weakness. Due to UE/LE weakness, he has difficulty with turning in bed w/o assistance. Would benefit from hospital bed b/c this would help with positional changes. This would also decrease his risk of aspiration due to underlying dementia.   7. Vitamin D deficiency disease Comments: I will check a vitamin D level and supplement as needed.  - VITAMIN D 25 Hydroxy (Vit-D Deficiency, Fractures)     Patient was given opportunity to ask questions. Patient verbalized understanding of the plan and was able to repeat key elements of the plan. All  questions were answered to their satisfaction.   Manuel Greenland, MD   I, Manuel Greenland, MD, have reviewed all documentation for this visit. The documentation on 01/22/20 for the exam, diagnosis, procedures, and orders are all accurate and complete.  THE PATIENT IS ENCOURAGED TO PRACTICE SOCIAL DISTANCING DUE TO THE COVID-19 PANDEMIC.

## 2020-01-10 ENCOUNTER — Ambulatory Visit: Payer: Medicare Other

## 2020-01-10 ENCOUNTER — Telehealth: Payer: Self-pay

## 2020-01-10 DIAGNOSIS — F5101 Primary insomnia: Secondary | ICD-10-CM

## 2020-01-10 DIAGNOSIS — I10 Essential (primary) hypertension: Secondary | ICD-10-CM

## 2020-01-10 LAB — CMP14+EGFR
ALT: 18 IU/L (ref 0–44)
AST: 17 IU/L (ref 0–40)
Albumin/Globulin Ratio: 1.3 (ref 1.2–2.2)
Albumin: 4 g/dL (ref 3.6–4.6)
Alkaline Phosphatase: 70 IU/L (ref 44–121)
BUN/Creatinine Ratio: 12 (ref 10–24)
BUN: 13 mg/dL (ref 8–27)
Bilirubin Total: 0.2 mg/dL (ref 0.0–1.2)
CO2: 24 mmol/L (ref 20–29)
Calcium: 9.5 mg/dL (ref 8.6–10.2)
Chloride: 92 mmol/L — ABNORMAL LOW (ref 96–106)
Creatinine, Ser: 1.07 mg/dL (ref 0.76–1.27)
GFR calc Af Amer: 73 mL/min/{1.73_m2} (ref >59)
GFR calc non Af Amer: 63 mL/min/{1.73_m2} (ref >59)
Globulin, Total: 3.2 g/dL (ref 1.5–4.5)
Glucose: 90 mg/dL (ref 65–99)
Potassium: 3.7 mmol/L (ref 3.5–5.2)
Sodium: 131 mmol/L — ABNORMAL LOW (ref 134–144)
Total Protein: 7.2 g/dL (ref 6.0–8.5)

## 2020-01-10 NOTE — Chronic Care Management (AMB) (Signed)
Chronic Care Management    Social Work Follow Up Note  01/10/2020 Name: Manuel [name] MRN: [id number] DOB: 10/02/1932  Manuel [name] is a 86 y.o. year old male who is a primary care patient of Dorothyann Peng, MD. The CCM team was consulted for assistance with care coordination.   Review of patient status, including review of consultants reports, other relevant assessments, and collaboration with appropriate care team members and the patient's provider was performed as part of comprehensive patient evaluation and provision of chronic care management services.    SDOH (Social Determinants of Health) assessments performed: No    Outpatient Encounter Medications as of 01/10/2020  Medication Sig  . acetaminophen (TYLENOL) 500 MG tablet Take 1,000 mg by mouth every 6 (six) hours as needed for moderate pain.  [name] amLODipine (NORVASC) 5 MG tablet TAKE 1 TABLET BY MOUTH EVERY DAY  . Ascorbic Acid (VITAMIN C PO) Take 100 mg by mouth daily.  [name] aspirin EC 81 MG tablet Take 81 mg by mouth every evening.   . Calcium Carbonate Antacid (CALCIUM CARBONATE PO) Take 100 mg by mouth. 3 x weekly  . cholecalciferol (VITAMIN D3) 25 MCG (1000 UT) tablet Take 2,000 Units by mouth every evening.  . diphenhydrAMINE HCl, Sleep, (ZZZQUIL PO) Take 1 tablet by mouth at bedtime as needed.  [name] losartan-hydrochlorothiazide (HYZAAR) 100-25 MG tablet TAKE 1 TABLET BY MOUTH EVERY DAY  . memantine (NAMENDA) 10 MG tablet TAKE 1 TABLET BY MOUTH TWICE A DAY  . tamsulosin (FLOMAX) 0.4 MG CAPS capsule TAKE 1 CAPSULE BY MOUTH EVERY DAY  . vitamin E 200 UNIT capsule Take 200 Units by mouth every evening.   No facility-administered encounter medications on file as of 01/10/2020.     Goals Addressed            This Visit's Progress   . COMPLETED: "he needs a W/C"       CARE PLAN ENTRY (see longitudinal plan of care for additional care plan information)  Current Barriers:  [name] Knowledge Deficits related to how to obtain  DME (manual W/C) . Chronic Disease Management support and education needs related to Essential Hypertension, Dementia, Parenchymal renal hypertension, stage 1 through 4, CKD stage 3   . Cognitive Deficits  Nurse Case Manager Clinical Goal(s):  [name] Over the next 30 days, patient will work with PCP and DME supplier to address needs related to DME needs for improved mobility and to help reduce fall risk  CCM SW Interventions: Completed 01/10/20 . Successful outbound call placed to the patients sister/caregiver Manuel [name] . Determined the patient has obtained his walker and is doing well with it . Goal closed  Completed 12/24/19 with Manuel [name] . Successful outbound call placed to the patients niece/caregiver to assess goal progression . Determined Mrs. Manuel [name] has been in contact with [employer] regarding delivery of rollator to the patients home o Co-pay has been paid o Rollator is to be delivered "in 5-7 days" . Discussed Mrs. Manuel [name] has noticed the patient "slowing down" during outings and is concerned he may need a wheelchair in the near future to assist with physician appointments and other outings . Encouraged Mrs. Manuel [name] to contact the care management team as needed to assist with coordination of DME needs . Collaboration with RN Care Manager to provide an update on intervention and plan surrounding DME needs  Interventions:  . Inter-disciplinary care team collaboration (see longitudinal plan of care) . Evaluation of current treatment plan related to Impaired  Physical Mobility/Gait and patient's adherence to plan as established by provider . Determined Mr. Pentecost will benefit from having a rollator walker for indoor and outdoor use to help with impaired gait and reduce his fall risk . Educated niece Manuel [name] about how to obtain this type of DME from a DME supplier, coordinating MD order with PCP, advised her of next steps  . Collaborated with PCP Dr. Baird [name] via in basket message  regarding patient's need for a rollator walker to be sent to [place] . Discussed plans with patient for ongoing care management follow up and provided patient with direct contact information for care management team  Patient Self Care Activities:  . Self administers medications as prescribed . Attends all scheduled provider appointments . Calls pharmacy for medication refills . Calls provider office for new concerns or questions . Supportive family to assist with care needs  Please see past updates related to this goal by clicking on the "Past Updates" button in the selected goal      . Assist with ongoing care management and care coordination needs       CARE PLAN ENTRY (see longitudinal plan of care for additional care plan information)  Current Barriers:  . Ongoing chronic conditions including HTN and Dementia . Limited ability to manage own care coordination needs due to limitations from dementia  Social Work Clinical Goal(s):  [name] Over the next 120 days the patient will work with CM team to address ongoing care management concerns. Goal not met due to scheduling limitations surrounding COVID 19 pandemic . New 10/10/19- Over the next 60 days the patient and his caregivers will work with SW to become more knowledgeable of respite resources available to the patient Goal Met . New 01/10/20 Over the next 14 days the patient and his caregiver will follow up with patients primary provider to address ongoing insomnia  CCM SW Interventions: Completed 01/10/20 with Manuel [name] and Manuel [name] . Successful outbound call placed to the patients caregivers Manuel [name] and Manuel [name] . Discussed the patient was seen by his primary provider on 9/16 and given samples of medication to help with sleep o Mrs. Manuel [name] reports she was under the impression this medication was also being called into the patients pharmacy - "It's not at the pharmacy" o Determined Mrs. Manuel [name] is choosing to hold the  medication due to concern she will start a new medication and run out prior to the pharmacy receiving a prescription . Determined the patient is not sleeping well and was up all last night . Advised Mrs. Manuel [name] SW would communicate prescription needs to the patients provider for follow up . Collaboration with Dr. Baird [name] regarding prescription refill needs . Scheduled follow up over the next week to assess goal progression  Completed 12/24/19 with Manuel [name] . Successful outbound call placed to the patients niece/caregiver to assess goal progression . Determined the patient has been seen recently by his dentist to address concerns with denture use and discuss barriers to eating while using dentures o Mrs. Manuel [name] reports that since the dental appointment the patient has not had any other complaints surrounding denture use or swallowing . Assessed for interventions done to dentures by dentist o Mrs. Manuel [name] reports no changes were made and that since the patient is responding better to use she is no longer concerned . Encouraged Mrs. Manuel [name] to contact care management team as needed to assist with care coordination needs . Scheduled follow up call over the next month  Completed  12/09/19 with Manuel [name] . Successful outbound call placed to Mrs. Manuel [name] to assess goal progression . Determined the patient is experiencing some difficulty swallowing o Mrs. Manuel [name] reports the patient will not eat with dentures in  o The patient is having difficulty swallowing food including applesauce but is not experiencing difficulty with thin liquids o Mrs. Manuel [name] reports she has noted some weight loss as well . Advised Mrs. Manuel [name] the patient would need to be seen by his physician to assess needs and place a referral for home health . Collaboration with patient primary provider Dr. Baird [name] regarding above information o Informed the patients niece was contacted by Manuel [name], Manuel [name] who reports Mrs.  Manuel [name] does not want an appointment during the month of August and will bring patient to next scheduled appointment in September . Scheduled follow up call over the next two weeks  Completed 10/31/19 with Manuel [name] . Collaboration with Manuel [name], Manuel [name] with primary proivder office who reports contact by [facility] requesting follow up on respite care plans for the patient . Successful outbound call placed to the patients niece, Manuel [name] to review previous education surrounding respite care resources o Discussed opportunity to enroll patient with [facility]" program - family not interested at this time due to cost o Reviewed the patient recent referral to [facility] caregiver respite program- family has yet to be contacted. SW advised the last communication received from Mrs. Manuel [name] was the family would be contacted over the next several weeks . Discussed Mrs. Manuel [name] plans to go out of town for a week and is concerned about leaving the patient and his sister alone in the home o The patients other niece also lives in [place] and is available to help as needed Manuel [name] from [facility] plans to check on the patient daily from Tuesday - Friday . Advised Mrs. Manuel [name] there are no resources to assist with checking on the patient next week other than privately hiring a caregiver o Mrs. Manuel [name] reports this is not an options . Provided education on local resources and the capacity in which their "respite" programs are able to offer assistance . Answered questions surrounding Medicare benefit and what it does/ does not cover in relations to respite caregiver assistance . Educated on the [facility] program including services offered and funding  o Obtained verbal consent to refer patient to [facility] to determine if the patient would qualify for services and what the monthly cost would be . Collaboration with Manuel [name], SW  with [facility]  o Referral received from Mrs. Manuel [name] who reports plans to contact Mrs. Manuel [name]  . Scheduled follow up over the next month  Patient Self Care Activities:  . Attends all scheduled provider appointments . Calls provider office for new concerns or questions . Patients sister verbalizes understanding to contact CM team with future care management needs  Please see past updates related to this goal by clicking on the "Past Updates" button in the selected goal          Follow Up Plan: SW will follow up with patient by phone over the next week.  [name], BSW, CDP Social Worker, Certified Dementia Practitioner [place] [facility] [place] [facility] [phone]  Total time spent performing care coordination and/or care management activities with the patient by phone or face to face = 15 minutes.

## 2020-01-10 NOTE — Patient Instructions (Signed)
Social Worker Visit Information  Goals we discussed today:  Goals Addressed            This Visit's Progress   . COMPLETED: "he needs a W/C"       CARE PLAN ENTRY (see longitudinal plan of care for additional care plan information)  Current Barriers:  [name] Knowledge Deficits related to how to obtain DME (manual W/C) . Chronic Disease Management support and education needs related to Essential Hypertension, Dementia, Parenchymal renal hypertension, stage 1 through 4, CKD stage 3   . Cognitive Deficits  Nurse Case Manager Clinical Goal(s):  [name] Over the next 30 days, patient will work with PCP and DME supplier to address needs related to DME needs for improved mobility and to help reduce fall risk  CCM SW Interventions: Completed 01/10/20 . Successful outbound call placed to the patients sister/caregiver [facility] . Determined the patient has obtained his walker and is doing well with it . Goal closed  Completed 12/24/19 with [name] . Successful outbound call placed to the patients niece/caregiver to assess goal progression . Determined [name] has been in contact with [facility] regarding delivery of rollator to the patients home o Co-pay has been paid o Rollator is to be delivered "in 5-7 days" . Discussed [name] has noticed the patient "slowing down" during outings and is concerned he may need a wheelchair in the near future to assist with physician appointments and other outings . Encouraged [name] to contact the care management team as needed to assist with coordination of DME needs . Collaboration with RN Care Manager to provide an update on intervention and plan surrounding DME needs  Interventions:  . Inter-disciplinary care team collaboration (see longitudinal plan of care) . Evaluation of current treatment plan related to Impaired Physical Mobility/Gait and patient's adherence to plan as established by provider . Determined Mr. Geissinger will benefit  from having a rollator walker for indoor and outdoor use to help with impaired gait and reduce his fall risk . Educated niece [name] about how to obtain this type of DME from a DME supplier, coordinating MD order with PCP, advised her of next steps  . Collaborated with PCP Dr. Baird Cancer via in basket message regarding patient's need for a rollator walker to be sent to [facility] . Discussed plans with patient for ongoing care management follow up and provided patient with direct contact information for care management team  Patient Self Care Activities:  . Self administers medications as prescribed . Attends all scheduled provider appointments . Calls pharmacy for medication refills . Calls provider office for new concerns or questions . Supportive family to assist with care needs  Please see past updates related to this goal by clicking on the "Past Updates" button in the selected goal      . Assist with ongoing care management and care coordination needs       CARE PLAN ENTRY (see longitudinal plan of care for additional care plan information)  Current Barriers:  . Ongoing chronic conditions including HTN and Dementia . Limited ability to manage own care coordination needs due to limitations from dementia  Social Work Clinical Goal(s):  [name] Over the next 120 days the patient will work with CM team to address ongoing care management concerns. Goal not met due to scheduling limitations surrounding COVID 19 pandemic . New 10/10/19- Over the next 60 days the patient and his caregivers will work with SW to become more knowledgeable of respite resources  available to the patient Goal Met . New 01/10/20 Over the next 14 days the patient and his caregiver will follow up with patients primary provider to address ongoing insomnia  CCM SW Interventions: Completed 01/10/20 with [name] and Deeann Cree . Successful outbound call placed to the patients caregivers [name] Drema [place] and [name] . Discussed the patient was seen by his primary provider on 9/16 and given samples of medication to help with sleep o [name] reports she was under the impression this medication was also being called into the patients pharmacy - "It's not at the pharmacy" o Determined [name] is choosing to hold the medication due to concern she will start a new medication and run out prior to the pharmacy receiving a prescription . Determined the patient is not sleeping well and was up all last night . Advised [name] SW would communicate prescription needs to the patients provider for follow up . Collaboration with Dr. Baird Cancer regarding prescription refill needs . Scheduled follow up over the next week to assess goal progression  Completed 12/24/19 with [name] . Successful outbound call placed to the patients niece/caregiver to assess goal progression . Determined the patient has been seen recently by his dentist to address concerns with denture use and discuss barriers to eating while using dentures o [name] reports that since the dental appointment the patient has not had any other complaints surrounding denture use or swallowing . Assessed for interventions done to dentures by dentist o [name] reports no changes were made and that since the patient is responding better to use she is no longer concerned . Encouraged [name] to contact care management team as needed to assist with care coordination needs . Scheduled follow up call over the next month  Completed 12/09/19 with [name] . Successful outbound call placed to [name] to assess goal progression . Determined the patient is experiencing some difficulty swallowing o [name] reports the patient will not eat with dentures in  o The patient is having difficulty swallowing food including applesauce but is not experiencing difficulty with thin liquids o [name] reports she has noted some  weight loss as well . Advised [name] the patient would need to be seen by his physician to assess needs and place a referral for home health . Collaboration with patient primary provider Dr. Baird Cancer regarding above information o Informed the patients niece was contacted by Michelle Nasuti, [place] who reports [name] does not want an appointment during the month of August and will bring patient to next scheduled appointment in September . Scheduled follow up call over the next two weeks  Completed 10/31/19 with [name] . Collaboration with Michelle Nasuti, [place] with primary proivder office who reports contact by [facility] requesting follow up on respite care plans for the patient . Successful outbound call placed to the patients niece, [name] to review previous education surrounding respite care resources o Discussed opportunity to enroll patient with [facility] "Connections" program - family not interested at this time due to cost o Reviewed the patient recent referral to [facility] caregiver respite program- family has yet to be contacted. SW advised the last communication received from [name] was the family would be contacted over the next several weeks . Discussed [name] plans to go out of town for a week and is concerned about leaving the patient and his sister alone in the home o The patients  other niece also lives in [place] and is available to help as needed Lyn Hollingshead from [facility] plans to check on the patient daily from Tuesday - Friday . Advised [name] there are no resources to assist with checking on the patient next week other than privately hiring a caregiver o [name] reports this is not an options . Provided education on local resources and the capacity in which their "respite" programs are able to offer assistance . Answered questions surrounding Medicare benefit and what it does/ does not cover in  relations to respite caregiver assistance . Educated on the [facility] program including services offered and funding  o Obtained verbal consent to refer patient to [facility] to determine if the patient would qualify for services and what the monthly cost would be . Collaboration with Jola Baptist, SW with [facility]  o Referral received from Mrs. Lorel Monaco who reports plans to contact [name]  . Scheduled follow up over the next month  Patient Self Care Activities:  . Attends all scheduled provider appointments . Calls provider office for new concerns or questions . Patients sister verbalizes understanding to contact CM team with future care management needs  Please see past updates related to this goal by clicking on the "Past Updates" button in the selected goal          Follow Up Plan: SW will follow up with patient by phone over the next week.   [name], BSW, CDP Social Worker, Certified Dementia Practitioner [place] [facility] [phone]

## 2020-01-10 NOTE — Chronic Care Management (AMB) (Signed)
Chronic Care Management    Social Work Follow Up Note  01/10/2020 Name: ORLEY LAWRY MRN: [id number] DOB: [birth year]  Catalina Gravel Goostree is a 86 y.o. year old male who is a primary care patient of [name], MD. The CCM team was consulted for assistance with care coordination.   Review of patient status, including review of consultants reports, other relevant assessments, and collaboration with appropriate care team members and the patient's provider was performed as part of comprehensive patient evaluation and provision of chronic care management services.    SDOH (Social Determinants of Health) assessments performed: No    Outpatient Encounter Medications as of 01/10/2020  Medication Sig  . acetaminophen (TYLENOL) 500 MG tablet Take 1,000 mg by mouth every 6 (six) hours as needed for moderate pain.  [name] amLODipine (NORVASC) 5 MG tablet TAKE 1 TABLET BY MOUTH EVERY DAY  . Ascorbic Acid (VITAMIN C PO) Take 100 mg by mouth daily.  [name] aspirin EC 81 MG tablet Take 81 mg by mouth every evening.   . Calcium Carbonate Antacid (CALCIUM CARBONATE PO) Take 100 mg by mouth. 3 x weekly  . cholecalciferol (VITAMIN D3) 25 MCG (1000 UT) tablet Take 2,000 Units by mouth every evening.  . diphenhydrAMINE HCl, Sleep, (ZZZQUIL PO) Take 1 tablet by mouth at bedtime as needed.  [name] losartan-hydrochlorothiazide (HYZAAR) 100-25 MG tablet TAKE 1 TABLET BY MOUTH EVERY DAY  . memantine (NAMENDA) 10 MG tablet TAKE 1 TABLET BY MOUTH TWICE A DAY  . tamsulosin (FLOMAX) 0.4 MG CAPS capsule TAKE 1 CAPSULE BY MOUTH EVERY DAY  . vitamin E 200 UNIT capsule Take 200 Units by mouth every evening.   No facility-administered encounter medications on file as of 01/10/2020.     Goals Addressed            This Visit's Progress   . COMPLETED: "he needs a W/C"       CARE PLAN ENTRY (see longitudinal plan of care for additional care plan information)  Current Barriers:  [name] Knowledge Deficits related to how to obtain  DME (manual W/C) . Chronic Disease Management support and education needs related to Essential Hypertension, Dementia, Parenchymal renal hypertension, stage 1 through 4, CKD stage 3   . Cognitive Deficits  Nurse Case Manager Clinical Goal(s):  [name] Over the next 30 days, patient will work with PCP and DME supplier to address needs related to DME needs for improved mobility and to help reduce fall risk  CCM SW Interventions: Completed 01/10/20 . Successful outbound call placed to the patients sister/caregiver [facility] . Determined the patient has obtained his walker and is doing well with it . Goal closed  Completed 12/24/19 with [name] . Successful outbound call placed to the patients niece/caregiver to assess goal progression . Determined [name] has been in contact with [employer] regarding delivery of rollator to the patients home o Co-pay has been paid o Rollator is to be delivered "in 5-7 days" . Discussed [name] has noticed the patient "slowing down" during outings and is concerned he may need a wheelchair in the near future to assist with physician appointments and other outings . Encouraged [name] to contact the care management team as needed to assist with coordination of DME needs . Collaboration with RN Care Manager to provide an update on intervention and plan surrounding DME needs  Interventions:  . Inter-disciplinary care team collaboration (see longitudinal plan of care) . Evaluation of current treatment plan related to Impaired  Physical Mobility/Gait and patient's adherence to plan as established by provider . Determined Mr. Anello will benefit from having a rollator walker for indoor and outdoor use to help with impaired gait and reduce his fall risk . Educated niece Sheralyn Boatman about how to obtain this type of DME from a DME supplier, coordinating MD order with PCP, advised her of next steps  . Collaborated with PCP Dr. Allyne Gee via in basket message  regarding patient's need for a rollator walker to be sent to [facility] . Discussed plans with patient for ongoing care management follow up and provided patient with direct contact information for care management team  Patient Self Care Activities:  . Self administers medications as prescribed . Attends all scheduled provider appointments . Calls pharmacy for medication refills . Calls provider office for new concerns or questions . Supportive family to assist with care needs  Please see past updates related to this goal by clicking on the "Past Updates" button in the selected goal      . Assist with ongoing care management and care coordination needs       CARE PLAN ENTRY (see longitudinal plan of care for additional care plan information)  Current Barriers:  . Ongoing chronic conditions including HTN and Dementia . Limited ability to manage own care coordination needs due to limitations from dementia  Social Work Clinical Goal(s):  [name] Over the next 120 days the patient will work with CM team to address ongoing care management concerns. Goal not met due to scheduling limitations surrounding COVID 19 pandemic . New 10/10/19- Over the next 60 days the patient and his caregivers will work with SW to become more knowledgeable of respite resources available to the patient Goal Met . New 01/10/20 Over the next 14 days the patient and his caregiver will follow up with patients primary provider to address ongoing insomnia  CCM SW Interventions: Completed 01/10/20 with [name] and Arie Sabina . Successful outbound call placed to the patients caregivers [name] Zachery Dauer and [name] . Discussed the patient was seen by his primary provider on 9/16 and given samples of medication to help with sleep o Mrs. Tanya Nones reports she was under the impression this medication was also being called into the patients pharmacy - "It's not at the pharmacy" o Determined Mrs. Tanya Nones is choosing to hold the  medication due to concern she will start a new medication and run out prior to the pharmacy receiving a prescription . Determined the patient is not sleeping well and was up all last night . Advised Mrs. Tanya Nones SW would communicate prescription needs to the patients provider for follow up . Collaboration with Dr. Allyne Gee regarding prescription refill needs o Dr Allyne Gee reports she gave a 9 day sample and did not call in a prescription due to waiting to see if new medication would be effective . Successful outbound call to Mrs. Tanya Nones to report above communication from Dr. Allyne Gee and request she start the medication tonight o Mrs. Tanya Nones reports she did in fact give the patient the medication 9/16 and it was ineffective o Advised Mrs Tanya Nones SW misunderstood previous conversation o Mrs Tanya Nones reports she will try medication throughout the weekend  . Collaboration with Dr. Allyne Gee to provide correct information due to SW misunderstanding previous conversation . Scheduled follow up over the next week to assess goal progression  Completed 12/24/19 with [name] . Successful outbound call placed to the patients niece/caregiver to assess goal progression . Determined the patient has  been seen recently by his dentist to address concerns with denture use and discuss barriers to eating while using dentures o [name] reports that since the dental appointment the patient has not had any other complaints surrounding denture use or swallowing . Assessed for interventions done to dentures by dentist o [name] reports no changes were made and that since the patient is responding better to use she is no longer concerned . Encouraged [name] to contact care management team as needed to assist with care coordination needs . Scheduled follow up call over the next month  Completed 12/09/19 with [name] . Successful outbound call placed to [name] to assess goal  progression . Determined the patient is experiencing some difficulty swallowing o [name] reports the patient will not eat with dentures in  o The patient is having difficulty swallowing food including applesauce but is not experiencing difficulty with thin liquids o [name] reports she has noted some weight loss as well . Advised [name] the patient would need to be seen by his physician to assess needs and place a referral for home health . Collaboration with patient primary provider Dr. Baird Cancer regarding above information o Informed the patients niece was contacted by Michelle Nasuti, [place] who reports [name] does not want an appointment during the month of August and will bring patient to next scheduled appointment in September . Scheduled follow up call over the next two weeks  Completed 10/31/19 with [name] . Collaboration with Michelle Nasuti, [place] with primary proivder office who reports contact by [facility] requesting follow up on respite care plans for the patient . Successful outbound call placed to the patients niece, [name] to review previous education surrounding respite care resources o Discussed opportunity to enroll patient with [facility] "[facility]" program - family not interested at this time due to cost o Reviewed the patient recent referral to [facility] caregiver respite program- family has yet to be contacted. SW advised the last communication received from [name] was the family would be contacted over the next several weeks . Discussed [name] plans to go out of town for a week and is concerned about leaving the patient and his sister alone in the home o The patients other niece also lives in [place] and is available to help as needed Lyn Hollingshead from [facility] plans to check on the patient daily from Tuesday - Friday . Advised [name] there are no resources to assist with checking on the  patient next week other than privately hiring a caregiver o [name] reports this is not an options . Provided education on local resources and the capacity in which their "respite" programs are able to offer assistance . Answered questions surrounding Medicare benefit and what it does/ does not cover in relations to respite caregiver assistance . Educated on the [facility] program including services offered and funding  o Obtained verbal consent to refer patient to [facility] to determine if the patient would qualify for services and what the monthly cost would be . Collaboration with Jola Baptist, SW with [facility]  o Referral received from Mrs. Lorel Monaco who reports plans to contact [name]  . Scheduled follow up over the next month  Patient Self Care Activities:  . Attends all scheduled provider appointments . Calls provider office for new concerns or questions . Patients sister verbalizes understanding to contact CM team  with future care management needs  Please see past updates related to this goal by clicking on the "Past Updates" button in the selected goal          Follow Up Plan: SW will follow up with patient by phone over the next week   [name], BSW, CDP Social Worker, Certified Dementia Practitioner [place] [facility] [phone]  Total time spent performing care coordination and/or care management activities with the patient by phone or face to face = 15 minutes.

## 2020-01-12 ENCOUNTER — Other Ambulatory Visit: Payer: Self-pay | Admitting: Diagnostic Neuroimaging

## 2020-01-15 ENCOUNTER — Ambulatory Visit: Payer: Medicare Other

## 2020-01-15 ENCOUNTER — Other Ambulatory Visit: Payer: Self-pay

## 2020-01-15 DIAGNOSIS — F0391 Unspecified dementia with behavioral disturbance: Secondary | ICD-10-CM

## 2020-01-15 DIAGNOSIS — I10 Essential (primary) hypertension: Secondary | ICD-10-CM

## 2020-01-15 DIAGNOSIS — F5101 Primary insomnia: Secondary | ICD-10-CM

## 2020-01-15 MED ORDER — QUETIAPINE FUMARATE 25 MG PO TABS: ORAL_TABLET | ORAL | 1 refills | Status: DC

## 2020-01-15 NOTE — Chronic Care Management (AMB) (Signed)
Chronic Care Management    Social Work Follow Up Note  01/15/2020 Name: Manuel [name] MRN: [id number] DOB: [birth year]  Manuel [name] is a 86 y.o. year old male who is a primary care patient of [name], MD. The CCM team was consulted for assistance with care coordination.   Review of patient status, including review of consultants reports, other relevant assessments, and collaboration with appropriate care team members and the patient's provider was performed as part of comprehensive patient evaluation and provision of chronic care management services.    SDOH (Social Determinants of Health) assessments performed: No    Outpatient Encounter Medications as of 01/15/2020  Medication Sig  . acetaminophen (TYLENOL) 500 MG tablet Take 1,000 mg by mouth every 6 (six) hours as needed for moderate pain.  [name] amLODipine (NORVASC) 5 MG tablet TAKE 1 TABLET BY MOUTH EVERY DAY  . Ascorbic Acid (VITAMIN C PO) Take 100 mg by mouth daily.  [name] aspirin EC 81 MG tablet Take 81 mg by mouth every evening.   . Calcium Carbonate Antacid (CALCIUM CARBONATE PO) Take 100 mg by mouth. 3 x weekly  . cholecalciferol (VITAMIN D3) 25 MCG (1000 UT) tablet Take 2,000 Units by mouth every evening.  . diphenhydrAMINE HCl, Sleep, (ZZZQUIL PO) Take 1 tablet by mouth at bedtime as needed.  [name] losartan-hydrochlorothiazide (HYZAAR) 100-25 MG tablet TAKE 1 TABLET BY MOUTH EVERY DAY  . memantine (NAMENDA) 10 MG tablet TAKE 1 TABLET BY MOUTH TWICE A DAY  . tamsulosin (FLOMAX) 0.4 MG CAPS capsule TAKE 1 CAPSULE BY MOUTH EVERY DAY  . vitamin E 200 UNIT capsule Take 200 Units by mouth every evening.   No facility-administered encounter medications on file as of 01/15/2020.     Goals Addressed            This Visit's Progress   . Assist with ongoing care management and care coordination needs       CARE PLAN ENTRY (see longitudinal plan of care for additional care plan information)  Current Barriers:  . Ongoing  chronic conditions including HTN and Dementia . Limited ability to manage own care coordination needs due to limitations from dementia  Social Work Clinical Goal(s):  [name] Over the next 120 days the patient will work with CM team to address ongoing care management concerns. Goal not met due to scheduling limitations surrounding COVID 19 pandemic . New 10/10/19- Over the next 60 days the patient and his caregivers will work with SW to become more knowledgeable of respite resources available to the patient Goal Met . New 01/10/20 Over the next 14 days the patient and his caregiver will follow up with patients primary provider to address ongoing insomnia . New 01/15/20 Over the next 30 days the patient and his caregiver will work patient primary provider to obtain a prescription for an electric mattress  CCM SW Interventions: Completed 01/15/20 with [name] . Successful outbound call placed to [name] to assess goal progression . Determined [name] spoke to a staff member on 9/21 regarding patient medication regimen and is awaiting a return call with instructions o [name] reports she spoke with [place] . Discussed [name] is also interested in a prescription for an electric mattress that will assist with raising the head of the bed and feet . Advised [name] is unsure if the patient will need a prescription for a mattress considering the family does not want a hospital bed o [name] reports this was  discussed during patient last OV with Dr. Baird Cancer . Collaboration with Riki Altes requesting she also speak with Dr. Baird Cancer regarding an electric mattress when she follows up on medication questions o Carolyne Fiscal reports she would forward this request to CMA Tianna as Carolyne Fiscal is not who the patients caregiver spoke with . Scheduled follow up call to [name] over the next two weeks  Completed 01/10/20 with [name] and Deeann Cree . Successful outbound call  placed to the patients caregivers [name] Drema [place] and [name] . Discussed the patient was seen by his primary provider on 9/16 and given samples of medication to help with sleep o [name] reports she was under the impression this medication was also being called into the patients pharmacy - "It's not at the pharmacy" o Determined [name] is choosing to hold the medication due to concern she will start a new medication and run out prior to the pharmacy receiving a prescription . Determined the patient is not sleeping well and was up all last night . Advised [name] SW would communicate prescription needs to the patients provider for follow up . Collaboration with Dr. Baird Cancer regarding prescription refill needs o Dr Baird Cancer reports she gave a 9 day sample and did not call in a prescription due to waiting to see if new medication would be effective . Successful outbound call to [name] to report above communication from Dr. Baird Cancer and request she start the medication tonight o [name] reports she did in fact give the patient the medication 9/16 and it was ineffective o Advised [name] SW misunderstood previous conversation o [name] reports she will try medication throughout the weekend  . Collaboration with Dr. Baird Cancer to provide correct information due to SW misunderstanding previous conversation . Scheduled follow up over the next week to assess goal progression  Completed 12/24/19 with [name] . Successful outbound call placed to the patients niece/caregiver to assess goal progression . Determined the patient has been seen recently by his dentist to address concerns with denture use and discuss barriers to eating while using dentures o [name] reports that since the dental appointment the patient has not had any other complaints surrounding denture use or swallowing . Assessed for interventions done to dentures by dentist o [name] reports no  changes were made and that since the patient is responding better to use she is no longer concerned . Encouraged [name] to contact care management team as needed to assist with care coordination needs . Scheduled follow up call over the next month  Completed 12/09/19 with [name] . Successful outbound call placed to [name] to assess goal progression . Determined the patient is experiencing some difficulty swallowing o [name] reports the patient will not eat with dentures in  o The patient is having difficulty swallowing food including applesauce but is not experiencing difficulty with thin liquids o [name] reports she has noted some weight loss as well . Advised [name] the patient would need to be seen by his physician to assess needs and place a referral for home health . Collaboration with patient primary provider Dr. Baird Cancer regarding above information o Informed the patients niece was contacted by Michelle Nasuti, [place] who reports [name] does not want an appointment during the month of August and will bring patient to next scheduled appointment in September . Scheduled follow up call over the next two weeks  Completed 10/31/19 with [name] . Collaboration  with Michelle Nasuti, CMA with primary proivder office who reports contact by [facility] requesting follow up on respite care plans for the patient . Successful outbound call placed to the patients niece, [name] to review previous education surrounding respite care resources o Discussed opportunity to enroll patient with [facility] "Connections" program - family not interested at this time due to cost o Reviewed the patient recent referral to [facility] caregiver respite program- family has yet to be contacted. SW advised the last communication received from [name] was the family would be contacted over the next several weeks . Discussed [name]  plans to go out of town for a week and is concerned about leaving the patient and his sister alone in the home o The patients other niece also lives in [place] and is available to help as needed Lyn Hollingshead from [facility] plans to check on the patient daily from Tuesday - Friday . Advised [name] there are no resources to assist with checking on the patient next week other than privately hiring a caregiver o [name] reports this is not an options . Provided education on local resources and the capacity in which their "respite" programs are able to offer assistance . Answered questions surrounding Medicare benefit and what it does/ does not cover in relations to respite caregiver assistance . Educated on the [facility] program including services offered and funding  o Obtained verbal consent to refer patient to [facility] to determine if the patient would qualify for services and what the monthly cost would be . Collaboration with Jola Baptist, SW with [facility]  o Referral received from Mrs. Lorel Monaco who reports plans to contact [name]  . Scheduled follow up over the next month  Patient Self Care Activities:  . Attends all scheduled provider appointments . Calls provider office for new concerns or questions . Patients sister verbalizes understanding to contact CM team with future care management needs  Please see past updates related to this goal by clicking on the "Past Updates" button in the selected goal          Follow Up Plan: SW will follow up with patient by phone over the next two weeks.   [name], BSW, CDP Social Worker, Certified Dementia Practitioner [place] [facility] [phone]  Total time spent performing care coordination and/or care management activities with the patient by phone or face to face = 16 minutes.

## 2020-01-15 NOTE — Patient Instructions (Signed)
Social Worker Visit Information  Goals we discussed today:  Goals Addressed            This Visit's Progress   . Assist with ongoing care management and care coordination needs       CARE PLAN ENTRY (see longitudinal plan of care for additional care plan information)  Current Barriers:  . Ongoing chronic conditions including HTN and Dementia . Limited ability to manage own care coordination needs due to limitations from dementia  Social Work Clinical Goal(s):  [name] Over the next 120 days the patient will work with CM team to address ongoing care management concerns. Goal not met due to scheduling limitations surrounding COVID 19 pandemic . New 10/10/19- Over the next 60 days the patient and his caregivers will work with SW to become more knowledgeable of respite resources available to the patient Goal Met . New 01/10/20 Over the next 14 days the patient and his caregiver will follow up with patients primary provider to address ongoing insomnia . New 01/15/20 Over the next 30 days the patient and his caregiver will work patient primary provider to obtain a prescription for an electric mattress  CCM SW Interventions: Completed 01/15/20 with [name] . Successful outbound call placed to [name] to assess goal progression . Determined [name] spoke to a staff member on 9/21 regarding patient medication regimen and is awaiting a return call with instructions o [name] reports she spoke with [place] . Discussed [name] is also interested in a prescription for an electric mattress that will assist with raising the head of the bed and feet . Advised [name] is unsure if the patient will need a prescription for a mattress considering the family does not want a hospital bed o [name] reports this was discussed during patient last OV with Dr. Baird Cancer . Collaboration with Riki Altes requesting she also speak with Dr. Baird Cancer regarding an electric mattress when  she follows up on medication questions o Carolyne Fiscal reports she would forward this request to CMA Tianna as Carolyne Fiscal is not who the patients caregiver spoke with . Scheduled follow up call to [name] over the next two weeks  Completed 01/10/20 with [name] and Deeann Cree . Successful outbound call placed to the patients caregivers [name] Drema [place] and [name] . Discussed the patient was seen by his primary provider on 9/16 and given samples of medication to help with sleep o [name] reports she was under the impression this medication was also being called into the patients pharmacy - "It's not at the pharmacy" o Determined [name] is choosing to hold the medication due to concern she will start a new medication and run out prior to the pharmacy receiving a prescription . Determined the patient is not sleeping well and was up all last night . Advised [name] SW would communicate prescription needs to the patients provider for follow up . Collaboration with Dr. Baird Cancer regarding prescription refill needs o Dr Baird Cancer reports she gave a 9 day sample and did not call in a prescription due to waiting to see if new medication would be effective . Successful outbound call to [name] to report above communication from Dr. Baird Cancer and request she start the medication tonight o [name] reports she did in fact give the patient the medication 9/16 and it was ineffective o Advised [name] SW misunderstood previous conversation o [name] reports she will try medication throughout the weekend  .  Collaboration with Dr. Baird Cancer to provide correct information due to SW misunderstanding previous conversation . Scheduled follow up over the next week to assess goal progression  Completed 12/24/19 with [name] . Successful outbound call placed to the patients niece/caregiver to assess goal progression . Determined the patient has been seen recently by his dentist to  address concerns with denture use and discuss barriers to eating while using dentures o [name] reports that since the dental appointment the patient has not had any other complaints surrounding denture use or swallowing . Assessed for interventions done to dentures by dentist o [name] reports no changes were made and that since the patient is responding better to use she is no longer concerned . Encouraged [name] to contact care management team as needed to assist with care coordination needs . Scheduled follow up call over the next month  Completed 12/09/19 with [name] . Successful outbound call placed to [name] to assess goal progression . Determined the patient is experiencing some difficulty swallowing o [name] reports the patient will not eat with dentures in  o The patient is having difficulty swallowing food including applesauce but is not experiencing difficulty with thin liquids o [name] reports she has noted some weight loss as well . Advised [name] the patient would need to be seen by his physician to assess needs and place a referral for home health . Collaboration with patient primary provider Dr. Baird Cancer regarding above information o Informed the patients niece was contacted by Michelle Nasuti, [place] who reports [name] does not want an appointment during the month of August and will bring patient to next scheduled appointment in September . Scheduled follow up call over the next two weeks  Completed 10/31/19 with [name] . Collaboration with Michelle Nasuti, [place] with primary proivder office who reports contact by [facility] requesting follow up on respite care plans for the patient . Successful outbound call placed to the patients niece, [name] to review previous education surrounding respite care resources o Discussed opportunity to enroll patient with [facility] "[facility]" program - family not  interested at this time due to cost o Reviewed the patient recent referral to [facility] caregiver respite program- family has yet to be contacted. SW advised the last communication received from [name] was the family would be contacted over the next several weeks . Discussed [name] plans to go out of town for a week and is concerned about leaving the patient and his sister alone in the home o The patients other niece also lives in [place] and is available to help as needed Lyn Hollingshead from [facility] plans to check on the patient daily from Tuesday - Friday . Advised [name] there are no resources to assist with checking on the patient next week other than privately hiring a caregiver o [name] reports this is not an options . Provided education on local resources and the capacity in which their "respite" programs are able to offer assistance . Answered questions surrounding Medicare benefit and what it does/ does not cover in relations to respite caregiver assistance . Educated on the [facility] program including services offered and funding  o Obtained verbal consent to refer patient to [facility] to determine if the patient would qualify for services and what the monthly cost would be . Collaboration with Jola Baptist, SW with [facility]  o Referral  received from Mrs. Lorel Monaco who reports plans to contact [name]  . Scheduled follow up over the next month  Patient Self Care Activities:  . Attends all scheduled provider appointments . Calls provider office for new concerns or questions . Patients sister verbalizes understanding to contact CM team with future care management needs  Please see past updates related to this goal by clicking on the "Past Updates" button in the selected goal          Follow Up Plan: SW will follow up with patient by phone over the next two weeks.   [name], BSW, CDP Social  Worker, Certified Dementia Practitioner [place] [facility] [phone]

## 2020-01-16 ENCOUNTER — Telehealth: Payer: Self-pay

## 2020-01-16 NOTE — Telephone Encounter (Signed)
Manuel [name] the pt's niece called to check and see if the Belsomra has been replaced with a different sleep medication because it's not helping the pt.  Manuel [name] was told that generic Seroquel was faxed to the pharmacy for the pt and that the rx for the bed will be faxed to [facility].

## 2020-01-20 ENCOUNTER — Other Ambulatory Visit: Payer: Self-pay | Admitting: Diagnostic Neuroimaging

## 2020-01-23 ENCOUNTER — Other Ambulatory Visit: Payer: Self-pay | Admitting: Diagnostic Neuroimaging

## 2020-01-28 ENCOUNTER — Ambulatory Visit: Payer: Medicare Other

## 2020-01-28 DIAGNOSIS — I10 Essential (primary) hypertension: Secondary | ICD-10-CM

## 2020-01-28 DIAGNOSIS — F0391 Unspecified dementia with behavioral disturbance: Secondary | ICD-10-CM

## 2020-01-28 NOTE — Patient Instructions (Signed)
Social Worker Visit Information  Goals we discussed today:  Goals Addressed            This Visit's Progress   . Assist with ongoing care management and care coordination needs   On track    [place] (see longitudinal plan of care for additional care plan information)  Current Barriers:  . Ongoing chronic conditions including HTN and Dementia . Limited ability to manage own care coordination needs due to limitations from dementia  Social Work Clinical Goal(s):  [name] Over the next 120 days the patient will work with CM team to address ongoing care management concerns. Goal not met due to scheduling limitations surrounding COVID 19 pandemic . New 10/10/19- Over the next 60 days the patient and his caregivers will work with SW to become more knowledgeable of respite resources available to the patient Goal Met . New 01/10/20 Over the next 14 days the patient and his caregiver will follow up with patients primary provider to address ongoing insomnia Goal Met . New 01/15/20 Over the next 30 days the patient and his caregiver will work patient primary provider to obtain a prescription for an electric mattress  CCM SW Interventions: Completed 01/28/20 with [name] . Successful outbound call placed to [name] to assess patient goal progression . Determined the patient was prescribed a new medication (Seroquel 25 mg 1/2 tab PO QHS) for sleep on 01/15/20  o [name] reports she was unable to break and/or cut the tablet in half without it crumbling due to size of the table o She reports she has been administering a whole tablet which she feels is effective . Advised the patient SW would collaborate with Dr. Baird Cancer to inform of medication dosage the patient is receiving . Discussed [name] was contacted by [facility] to review order for hospital bed o [name] reported the [facility] representative informed her they needed more paperwork from the primary provider office  in order to fulfill request o Advised [name] would reach out to Dr. Baird Cancer to confirm receipt of faxed request from [facility] . Collaboration with Dr. Baird Cancer to inform of patient medication dose increase based on family inability to cut pill in half . Collaboration with Dr. Baird Cancer to advise [facility] planned to send a form for completion in order to fulfill order for hospital bed  Completed 01/15/20 with [name] . Successful outbound call placed to [name] to assess goal progression . Determined [name] spoke to a staff member on 9/21 regarding patient medication regimen and is awaiting a return call with instructions o [name] reports she spoke with [place] . Discussed [name] is also interested in a prescription for an electric mattress that will assist with raising the head of the bed and feet . Advised [name] is unsure if the patient will need a prescription for a mattress considering the family does not want a hospital bed o [name] reports this was discussed during patient last OV with Dr. Baird Cancer . Collaboration with Riki Altes requesting she also speak with Dr. Baird Cancer regarding an electric mattress when she follows up on medication questions o Carolyne Fiscal reports she would forward this request to CMA Tianna as Carolyne Fiscal is not who the patients caregiver spoke with . Scheduled follow up call to [name] over the next two weeks  Completed 01/10/20 with [name] and Deeann Cree . Successful outbound call placed to the patients caregivers [name] Drema [place]  and [name] . Discussed the patient was seen by his primary provider on 9/16 and given samples of medication to help with sleep o [name] reports she was under the impression this medication was also being called into the patients pharmacy - "It's not at the pharmacy" o Determined [name] is choosing to hold the medication due to concern she will start a new  medication and run out prior to the pharmacy receiving a prescription . Determined the patient is not sleeping well and was up all last night . Advised [name] SW would communicate prescription needs to the patients provider for follow up . Collaboration with Dr. Baird Cancer regarding prescription refill needs o Dr Baird Cancer reports she gave a 9 day sample and did not call in a prescription due to waiting to see if new medication would be effective . Successful outbound call to [name] to report above communication from Dr. Baird Cancer and request she start the medication tonight o [name] reports she did in fact give the patient the medication 9/16 and it was ineffective o Advised [name] SW misunderstood previous conversation o [name] reports she will try medication throughout the weekend  . Collaboration with Dr. Baird Cancer to provide correct information due to SW misunderstanding previous conversation . Scheduled follow up over the next week to assess goal progression  Completed 12/24/19 with [name] . Successful outbound call placed to the patients niece/caregiver to assess goal progression . Determined the patient has been seen recently by his dentist to address concerns with denture use and discuss barriers to eating while using dentures o [name] reports that since the dental appointment the patient has not had any other complaints surrounding denture use or swallowing . Assessed for interventions done to dentures by dentist o [name] reports no changes were made and that since the patient is responding better to use she is no longer concerned . Encouraged [name] to contact care management team as needed to assist with care coordination needs . Scheduled follow up call over the next month  Completed 12/09/19 with [name] . Successful outbound call placed to [name] to assess goal progression . Determined the patient is experiencing some  difficulty swallowing o [name] reports the patient will not eat with dentures in  o The patient is having difficulty swallowing food including applesauce but is not experiencing difficulty with thin liquids o [name] reports she has noted some weight loss as well . Advised [name] the patient would need to be seen by his physician to assess needs and place a referral for home health . Collaboration with patient primary provider Dr. Baird Cancer regarding above information o Informed the patients niece was contacted by Michelle Nasuti, [place] who reports [name] does not want an appointment during the month of August and will bring patient to next scheduled appointment in September . Scheduled follow up call over the next two weeks  Completed 10/31/19 with [name] . Collaboration with Michelle Nasuti, [place] with primary proivder office who reports contact by [facility] requesting follow up on respite care plans for the patient . Successful outbound call placed to the patients niece, [name] to review previous education surrounding respite care resources o Discussed opportunity to enroll patient with [facility] "Connections" program - family not interested at this time due to cost o Reviewed the patient recent referral to [facility] caregiver respite program- family has yet to be contacted. SW  advised the last communication received from [name] was the family would be contacted over the next several weeks . Discussed [name] plans to go out of town for a week and is concerned about leaving the patient and his sister alone in the home o The patients other niece also lives in [place] and is available to help as needed Lyn Hollingshead from [facility] plans to check on the patient daily from Tuesday - Friday . Advised [name] there are no resources to assist with checking on the patient next week other than privately hiring a  caregiver o [name] reports this is not an options . Provided education on local resources and the capacity in which their "respite" programs are able to offer assistance . Answered questions surrounding Medicare benefit and what it does/ does not cover in relations to respite caregiver assistance . Educated on the [facility] program including services offered and funding  o Obtained verbal consent to refer patient to [facility] to determine if the patient would qualify for services and what the monthly cost would be . Collaboration with Jola Baptist, SW with [facility]  o Referral received from Mrs. Lorel Monaco who reports plans to contact [name]  . Scheduled follow up over the next month  Patient Self Care Activities:  . Attends all scheduled provider appointments . Calls provider office for new concerns or questions . Patients sister verbalizes understanding to contact CM team with future care management needs  Please see past updates related to this goal by clicking on the "Past Updates" button in the selected goal          Follow Up Plan: SW will follow up with patient by phone over the next week.   [name], BSW, CDP Social Worker, Certified Dementia Practitioner [facility] [facility] [phone]

## 2020-01-28 NOTE — Chronic Care Management (AMB) (Signed)
Chronic Care Management    Social Work Follow Up Note  01/28/2020 Name: Manuel [name] MRN: [id number] DOB: 03-Jun-1932  Manuel [name] is a 86 y.o. year old male who is a primary care patient of [name], MD. The CCM team was consulted for assistance with care coordination.   Review of patient status, including review of consultants reports, other relevant assessments, and collaboration with appropriate care team members and the patient's provider was performed as part of comprehensive patient evaluation and provision of chronic care management services.    SDOH (Social Determinants of Health) assessments performed: No    Outpatient Encounter Medications as of 01/28/2020  Medication Sig   acetaminophen (TYLENOL) 500 MG tablet Take 1,000 mg by mouth every 6 (six) hours as needed for moderate pain.   amLODipine (NORVASC) 5 MG tablet TAKE 1 TABLET BY MOUTH EVERY DAY   Ascorbic Acid (VITAMIN C PO) Take 100 mg by mouth daily.   aspirin EC 81 MG tablet Take 81 mg by mouth every evening.    Calcium Carbonate Antacid (CALCIUM CARBONATE PO) Take 100 mg by mouth. 3 x weekly   cholecalciferol (VITAMIN D3) 25 MCG (1000 UT) tablet Take 2,000 Units by mouth every evening.   diphenhydrAMINE HCl, Sleep, (ZZZQUIL PO) Take 1 tablet by mouth at bedtime as needed.   losartan-hydrochlorothiazide (HYZAAR) 100-25 MG tablet TAKE 1 TABLET BY MOUTH EVERY DAY   memantine (NAMENDA) 10 MG tablet TAKE 1 TABLET BY MOUTH TWICE A DAY   QUEtiapine (SEROQUEL) 25 MG tablet Take half tablet at bedtime.   tamsulosin (FLOMAX) 0.4 MG CAPS capsule TAKE 1 CAPSULE BY MOUTH EVERY DAY   vitamin E 200 UNIT capsule Take 200 Units by mouth every evening.   No facility-administered encounter medications on file as of 01/28/2020.     Goals Addressed            This Visit's Progress    Assist with ongoing care management and care coordination needs   On track    [place] (see longitudinal plan of  care for additional care plan information)  Current Barriers:   Ongoing chronic conditions including HTN and Dementia  Limited ability to manage own care coordination needs due to limitations from dementia  Social Work Clinical Goal(s):   Over the next 120 days the patient will work with CM team to address ongoing care management concerns. Goal not met due to scheduling limitations surrounding COVID 19 pandemic  New 10/10/19- Over the next 60 days the patient and his caregivers will work with SW to become more knowledgeable of respite resources available to the patient Goal Met  New 01/10/20 Over the next 14 days the patient and his caregiver will follow up with patients primary provider to address ongoing insomnia Goal Met  New 01/15/20 Over the next 30 days the patient and his caregiver will work patient primary provider to obtain a prescription for an electric mattress  CCM SW Interventions: Completed 01/28/20 with [name]  Successful outbound call placed to [name] to assess patient goal progression  Determined the patient was prescribed a new medication (Seroquel 25 mg 1/2 tab PO QHS) for sleep on 01/15/20  o [name] reports she was unable to break and/or cut the tablet in half without it crumbling due to size of the table o She reports she has been administering a whole tablet which she feels is effective  Advised the patient SW would collaborate with Dr. Baird Cancer to inform of  medication dosage the patient is receiving  Discussed [name] was contacted by [facility] to review order for hospital bed o [name] reported the [facility] representative informed her they needed more paperwork from the primary provider office in order to fulfill request o Advised [name] would reach out to Dr. Baird Cancer to confirm receipt of faxed request from [facility] with Dr. Baird Cancer to inform of patient medication dose increase based on family inability  to cut pill in half  Collaboration with Dr. Baird Cancer to advise [facility] planned to send a form for completion in order to fulfill order for hospital bed  Completed 01/15/20 with [name]  Successful outbound call placed to [name] to assess goal progression  Determined [name] spoke to a staff member on 9/21 regarding patient medication regimen and is awaiting a return call with instructions o [name] reports she spoke with Carolyne Fiscal  Discussed [name] is also interested in a prescription for an electric mattress that will assist with raising the head of the bed and feet  Advised [name] SW is unsure if the patient will need a prescription for a mattress considering the family does not want a hospital bed o [name] reports this was discussed during patient last OV with Dr. Baird Cancer  Collaboration with Riki Altes requesting she also speak with Dr. Baird Cancer regarding an electric mattress when she follows up on medication questions o Carolyne Fiscal reports she would forward this request to CMA Tianna as Carolyne Fiscal is not who the patients caregiver spoke with  Scheduled follow up call to [name] over the next two weeks  Completed 01/10/20 with [name] and Deeann Cree  Successful outbound call placed to the patients caregivers [name] Drema [place] and [name]  Discussed the patient was seen by his primary provider on 9/16 and given samples of medication to help with sleep o [name] reports she was under the impression this medication was also being called into the patients pharmacy - "It's not at the pharmacy" o Determined [name] is choosing to hold the medication due to concern she will start a new medication and run out prior to the pharmacy receiving a prescription  Determined the patient is not sleeping well and was up all last night  Advised [name] SW would communicate prescription needs to the patients provider for follow  up  Collaboration with Dr. Baird Cancer regarding prescription refill needs o Dr Baird Cancer reports she gave a 9 day sample and did not call in a prescription due to waiting to see if new medication would be effective  Successful outbound call to [name] to report above communication from Dr. Baird Cancer and request she start the medication tonight o [name] reports she did in fact give the patient the medication 9/16 and it was ineffective o Advised [name] SW misunderstood previous conversation o [name] reports she will try medication throughout the weekend   Collaboration with Dr. Baird Cancer to provide correct information due to SW misunderstanding previous conversation  Scheduled follow up over the next week to assess goal progression  Completed 12/24/19 with [name]  Successful outbound call placed to the patients niece/caregiver to assess goal progression  Determined the patient has been seen recently by his dentist to address concerns with denture use and discuss barriers to eating while using dentures o [name] reports that since the dental appointment the patient has not had any other complaints surrounding denture use or  swallowing  Assessed for interventions done to dentures by dentist o [name] reports no changes were made and that since the patient is responding better to use she is no longer concerned  Encouraged [name] to contact care management team as needed to assist with care coordination needs  Scheduled follow up call over the next month  Completed 12/09/19 with [name]  Successful outbound call placed to [name] to assess goal progression  Determined the patient is experiencing some difficulty swallowing o [name] reports the patient will not eat with dentures in  o The patient is having difficulty swallowing food including applesauce but is not experiencing difficulty with thin liquids o [name] reports she has noted  some weight loss as well  Advised [name] the patient would need to be seen by his physician to assess needs and place a referral for home health  Collaboration with patient primary provider Dr. Baird Cancer regarding above information o Informed the patients niece was contacted by Michelle Nasuti, [place] who reports [name] does not want an appointment during the month of August and will bring patient to next scheduled appointment in September  Scheduled follow up call over the next two weeks  Completed 10/31/19 with [place] with Michelle Nasuti, [place] with primary proivder office who reports contact by [facility] requesting follow up on respite care plans for the patient  Successful outbound call placed to the patients niece, [name] to review previous education surrounding respite care resources o Discussed opportunity to enroll patient with [facility] "[facility]ctions" program - family not interested at this time due to cost o Reviewed the patient recent referral to [facility] caregiver respite program- family has yet to be contacted. SW advised the last communication received from [name] was the family would be contacted over the next several weeks  Discussed [name] plans to go out of town for a week and is concerned about leaving the patient and his sister alone in the home o The patients other niece also lives in [place] and is available to help as needed Lyn Hollingshead from [facility] plans to check on the patient daily from Tuesday - Friday  Advised [name] there are no resources to assist with checking on the patient next week other than privately hiring a caregiver o [name] reports this is not an options  Provided education on local resources and the capacity in which their "respite" programs are able to offer assistance  Answered questions surrounding Medicare benefit and what it does/ does not cover  in relations to respite caregiver assistance  Educated on the [facility] program including services offered and funding  o Obtained verbal consent to refer patient to [facility] to determine if the patient would qualify for services and what the monthly cost would be  Collaboration with Jola Baptist, SW with [facility]  o Referral received from Mrs. Lorel Monaco who reports plans to contact [name]   Scheduled follow up over the next month  Patient Self Care Activities:   Attends all scheduled provider appointments  Calls provider office for new concerns or questions  Patients sister verbalizes understanding to contact CM team with future care management needs  Please see past updates related to this goal by clicking on the "Past Updates" button in the selected goal          Follow Up Plan: SW will follow up  with patient by phone over the next week.   [name], BSW, CDP Social Worker, Certified Dementia Practitioner [facility] [phone]  Total time spent performing care coordination and/or care management activities with the patient by phone or face to face = 18 minutes.

## 2020-02-03 ENCOUNTER — Telehealth: Payer: Self-pay

## 2020-02-03 ENCOUNTER — Telehealth: Payer: Medicare Other

## 2020-02-03 NOTE — Telephone Encounter (Signed)
  Chronic Care Management   Outreach Note  02/03/2020 Name: Manuel [name] MRN: [id number] DOB: 22-Apr-1933  Referred by: Dorothyann Peng, MD Reason for referral : Care Coordination   An unsuccessful telephone outreach was attempted today. The patient was referred to the case management team for assistance with care management and care coordination.   Follow Up Plan: A HIPAA compliant phone message was left for the patient providing contact information and requesting a return call.  The care management team will reach out to the patient again over the next 7 days.   Bevelyn Ngo, BSW, CDP Social Worker, Certified Dementia Practitioner [facility] [phone]

## 2020-02-05 ENCOUNTER — Telehealth: Payer: Medicare Other

## 2020-02-05 ENCOUNTER — Telehealth: Payer: Self-pay

## 2020-02-05 NOTE — Telephone Encounter (Signed)
°  Chronic Care Management   Outreach Note  02/05/2020 Name: Manuel [name] MRN: [id number] DOB: 05/11/1932  Referred by: Dorothyann Peng, MD Reason for referral : Care Coordination   A second unsuccessful telephone outreach was attempted today. The patient was referred to the case management team for assistance with care management and care coordination.   Follow Up Plan: A HIPAA compliant phone message was left for the patient providing contact information and requesting a return call.  The care management team will reach out to the patient again over the next 14 days.   Bevelyn Ngo, BSW, CDP Social Worker, Certified Dementia Practitioner [facility] [phone]

## 2020-02-11 ENCOUNTER — Other Ambulatory Visit: Payer: Self-pay

## 2020-02-11 ENCOUNTER — Telehealth: Payer: Self-pay

## 2020-02-11 MED ORDER — QUETIAPINE FUMARATE 25 MG PO TABS
25.0000 mg | ORAL_TABLET | Freq: Every day | ORAL | 1 refills | Status: DC
Start: 2020-02-11 — End: 2020-03-23

## 2020-02-11 NOTE — Telephone Encounter (Signed)
The pt's sister Noel Christmas was asked if the pt is now taking 1 tablet daily of the quetiapine and the pt's sister said yes.

## 2020-02-12 DIAGNOSIS — M6281 Muscle weakness (generalized): Secondary | ICD-10-CM | POA: Diagnosis not present

## 2020-02-12 DIAGNOSIS — R54 Age-related physical debility: Secondary | ICD-10-CM | POA: Diagnosis not present

## 2020-02-19 ENCOUNTER — Telehealth: Payer: Medicare Other

## 2020-03-02 DIAGNOSIS — N390 Urinary tract infection, site not specified: Secondary | ICD-10-CM | POA: Diagnosis not present

## 2020-03-09 ENCOUNTER — Encounter: Payer: Self-pay | Admitting: Internal Medicine

## 2020-03-09 ENCOUNTER — Telehealth: Payer: Self-pay

## 2020-03-09 ENCOUNTER — Ambulatory Visit: Payer: Medicare Other

## 2020-03-09 ENCOUNTER — Other Ambulatory Visit: Payer: Self-pay | Admitting: Internal Medicine

## 2020-03-09 DIAGNOSIS — F0391 Unspecified dementia with behavioral disturbance: Secondary | ICD-10-CM

## 2020-03-09 DIAGNOSIS — I10 Essential (primary) hypertension: Secondary | ICD-10-CM

## 2020-03-09 MED ORDER — SHINGRIX 50 MCG/0.5ML IM SUSR: 0.5000 mL | Freq: Once | INTRAMUSCULAR | 0 refills | Status: AC

## 2020-03-09 NOTE — Chronic Care Management (AMB) (Signed)
Chronic Care Management    Social Work Follow Up Note  03/09/2020 Name: Manuel [name] MRN: [id number] DOB: [birth year]  Manuel [name] is a 86 y.o. year old male who is a primary care patient of [name], MD. The CCM team was consulted for assistance with care coordination.   Review of patient status, including review of consultants reports, other relevant assessments, and collaboration with appropriate care team members and the patient's provider was performed as part of comprehensive patient evaluation and provision of chronic care management services.    SDOH (Social Determinants of Health) assessments performed: No    Outpatient Encounter Medications as of 03/09/2020  Medication Sig   acetaminophen (TYLENOL) 500 MG tablet Take 1,000 mg by mouth every 6 (six) hours as needed for moderate pain.   amLODipine (NORVASC) 5 MG tablet TAKE 1 TABLET BY MOUTH EVERY DAY   Ascorbic Acid (VITAMIN C PO) Take 100 mg by mouth daily.   aspirin EC 81 MG tablet Take 81 mg by mouth every evening.    Calcium Carbonate Antacid (CALCIUM CARBONATE PO) Take 100 mg by mouth. 3 x weekly   cholecalciferol (VITAMIN D3) 25 MCG (1000 UT) tablet Take 2,000 Units by mouth every evening.   diphenhydrAMINE HCl, Sleep, (ZZZQUIL PO) Take 1 tablet by mouth at bedtime as needed.   losartan-hydrochlorothiazide (HYZAAR) 100-25 MG tablet TAKE 1 TABLET BY MOUTH EVERY DAY   memantine (NAMENDA) 10 MG tablet TAKE 1 TABLET BY MOUTH TWICE A DAY   QUEtiapine (SEROQUEL) 25 MG tablet Take 1 tablet (25 mg total) by mouth daily.   tamsulosin (FLOMAX) 0.4 MG CAPS capsule TAKE 1 CAPSULE BY MOUTH EVERY DAY   vitamin E 200 UNIT capsule Take 200 Units by mouth every evening.   No facility-administered encounter medications on file as of 03/09/2020.     Goals Addressed            This Visit's Progress    Assist with ongoing care management and care coordination needs   On track    [place] (see  longitudinal plan of care for additional care plan information)  Current Barriers:   Ongoing chronic conditions including HTN and Dementia  Limited ability to manage own care coordination needs due to limitations from dementia  Social Work Clinical Goal(s):   Over the next 120 days the patient will work with CM team to address ongoing care management concerns. Goal not met due to scheduling limitations surrounding COVID 19 pandemic  New 10/10/19- Over the next 60 days the patient and his caregivers will work with SW to become more knowledgeable of respite resources available to the patient Goal Met  New 01/10/20 Over the next 14 days the patient and his caregiver will follow up with patients primary provider to address ongoing insomnia Goal Met  New 01/15/20 Over the next 30 days the patient and his caregiver will work patient primary provider to obtain a prescription for an electric mattress Goal Met  New 03/09/20 Over the next 90 days the patient and his caregiver will work with SW to address care coordination needs  CCM SW Interventions: Completed 03/09/20 with [name]  Successful outbound call placed to the patients niece/caregiver [name]  Confirmed receipt of hospital bed o [name] reports patient did experience a fall from the bed since delivery but states it was due to removing rails o Rails have been placed back on bed o A carpet has been added from patient bed to the  bathroom to help reduce risk of slipping on hardwood floors  Discussed the patient is now using a walker instead of a cane o The patient has a rollator for use outside the home  Patient continues to be active with [facility] offered by [facility] informed patient and his caregivers she would request order for OT in the home   [name] requests feedback on frequency patient needs to receive shingles vaccine as well as a  colonoscopy o Collaboration with patients primary provider to request feedback o Scheduled follow up call over the next week  Completed 01/28/20 with [name]  Successful outbound call placed to [name] to assess patient goal progression  Determined the patient was prescribed a new medication (Seroquel 25 mg 1/2 tab PO QHS) for sleep on 01/15/20  o [name] reports she was unable to break and/or cut the tablet in half without it crumbling due to size of the table o She reports she has been administering a whole tablet which she feels is effective  Advised the patient SW would collaborate with Dr. Baird Cancer to inform of medication dosage the patient is receiving  Discussed [name] was contacted by [facility] to review order for hospital bed o [name] reported the [place] representative informed her they needed more paperwork from the primary provider office in order to fulfill request o Advised [name] SW would reach out to Dr. Baird Cancer to confirm receipt of faxed request from [place] with Dr. Baird Cancer to inform of patient medication dose increase based on family inability to cut pill in half  Collaboration with Dr. Baird Cancer to advise [place] planned to send a form for completion in order to fulfill order for hospital bed  Completed 01/15/20 with [name]  Successful outbound call placed to [name] to assess goal progression  Determined [name] spoke to a staff member on 9/21 regarding patient medication regimen and is awaiting a return call with instructions o [name] reports she spoke with Carolyne Fiscal  Discussed [name] is also interested in a prescription for an electric mattress that will assist with raising the head of the bed and feet  Advised [name] SW is unsure if the patient will need a prescription for a mattress considering the family does not want a hospital bed o [name] reports this was  discussed during patient last OV with Dr. Baird Cancer  Collaboration with Riki Altes requesting she also speak with Dr. Baird Cancer regarding an electric mattress when she follows up on medication questions o Carolyne Fiscal reports she would forward this request to CMA Tianna as Carolyne Fiscal is not who the patients caregiver spoke with  Scheduled follow up call to [name] over the next two weeks  Completed 01/10/20 with [name] and Deeann Cree  Successful outbound call placed to the patients caregivers [name] Drema [place] and [name]  Discussed the patient was seen by his primary provider on 9/16 and given samples of medication to help with sleep o [name] reports she was under the impression this medication was also being called into the patients pharmacy - "It's not at the pharmacy" o Determined [name] is choosing to hold the medication due to concern she will start a new medication and run out prior to the pharmacy receiving a prescription  Determined the patient is not sleeping well and was up all last night  Advised [name] SW would  communicate prescription needs to the patients provider for follow up  Collaboration with Dr. Baird Cancer regarding prescription refill needs o Dr Baird Cancer reports she gave a 9 day sample and did not call in a prescription due to waiting to see if new medication would be effective  Successful outbound call to [name] to report above communication from Dr. Baird Cancer and request she start the medication tonight o [name] reports she did in fact give the patient the medication 9/16 and it was ineffective o Advised [name] SW misunderstood previous conversation o [name] reports she will try medication throughout the weekend   Collaboration with Dr. Baird Cancer to provide correct information due to SW misunderstanding previous conversation  Scheduled follow up over the next week to assess goal progression  Completed 12/24/19 with [name]  Successful outbound call placed to the patients niece/caregiver to assess goal progression  Determined the patient has been seen recently by his dentist to address concerns with denture use and discuss barriers to eating while using dentures o [name] reports that since the dental appointment the patient has not had any other complaints surrounding denture use or swallowing  Assessed for interventions done to dentures by dentist o [name] reports no changes were made and that since the patient is responding better to use she is no longer concerned  Encouraged [name] to contact care management team as needed to assist with care coordination needs  Scheduled follow up call over the next month  Completed 12/09/19 with [name]  Successful outbound call placed to [name] to assess goal progression  Determined the patient is experiencing some difficulty swallowing o [name] reports the patient will not eat with dentures in  o The patient is having difficulty swallowing food including applesauce but is not experiencing difficulty with thin liquids o [name] reports she has noted some weight loss as well  Advised [name] the patient would need to be seen by his physician to assess needs and place a referral for home health  Collaboration with patient primary provider Dr. Baird Cancer regarding above information o Informed the patients niece was contacted by Michelle Nasuti, [place] who reports [name] does not want an appointment during the month of August and will bring patient to next scheduled appointment in September  Scheduled follow up call over the next two weeks  Completed 10/31/19 with [place] with Michelle Nasuti, [place] with primary proivder office who reports contact by [facility] requesting follow up on respite care plans for the patient  Successful outbound call placed to the patients niece, [name] to  review previous education surrounding respite care resources o Discussed opportunity to enroll patient with [facility] "Connections" program - family not interested at this time due to cost o Reviewed the patient recent referral to [facility] caregiver respite program- family has yet to be contacted. SW advised the last communication received from [name] was the family would be contacted over the next several weeks  Discussed [name] plans to go out of town for a week and is concerned about leaving the patient and his sister alone in the home o The patients other niece also lives in [place] and is available to help as needed Lyn Hollingshead from [facility] plans to check on the patient daily from Tuesday - Friday  Advised [name] there are no resources to assist with checking on the patient next week other than privately  hiring a caregiver o [name] reports this is not an options  Provided education on local resources and the capacity in which their "respite" programs are able to offer assistance  Answered questions surrounding Medicare benefit and what it does/ does not cover in relations to respite caregiver assistance  Educated on the [facility] program including services offered and funding  o Obtained verbal consent to refer patient to [facility] to determine if the patient would qualify for services and what the monthly cost would be  Collaboration with Jola Baptist, SW with [facility]  o Referral received from Mrs. Lorel Monaco who reports plans to contact [name]   Scheduled follow up over the next month  Patient Self Care Activities:   Attends all scheduled provider appointments  Calls provider office for new concerns or questions  Patients sister verbalizes understanding to contact CM team with future care management needs  Please see past updates related to this goal by clicking on the "Past Updates"  button in the selected goal          Follow Up Plan: SW will follow up with patient by phone over the next week.  [name], BSW, CDP Social Worker, Certified Dementia Practitioner [facility] [phone]  Total time spent performing care coordination and/or care management activities with the patient by phone or face to face = 20 minutes.

## 2020-03-09 NOTE — Patient Instructions (Signed)
Social Worker Visit Information  Goals we discussed today:  Goals Addressed            This Visit's Progress   . Assist with ongoing care management and care coordination needs   On track    [place] (see longitudinal plan of care for additional care plan information)  Current Barriers:  . Ongoing chronic conditions including HTN and Dementia . Limited ability to manage own care coordination needs due to limitations from dementia  Social Work Clinical Goal(s):  [name] Over the next 120 days the patient will work with CM team to address ongoing care management concerns. Goal not met due to scheduling limitations surrounding COVID 19 pandemic . New 10/10/19- Over the next 60 days the patient and his caregivers will work with SW to become more knowledgeable of respite resources available to the patient Goal Met . New 01/10/20 Over the next 14 days the patient and his caregiver will follow up with patients primary provider to address ongoing insomnia Goal Met . New 01/15/20 Over the next 30 days the patient and his caregiver will work patient primary provider to obtain a prescription for an electric mattress Goal Met . New 03/09/20 Over the next 90 days the patient and his caregiver will work with SW to address care coordination needs  CCM SW Interventions: Completed 03/09/20 with [name] . Successful outbound call placed to the patients niece/caregiver [name] . Confirmed receipt of hospital bed o [name] reports patient did experience a fall from the bed since delivery but states it was due to removing rails o Rails have been placed back on bed o A carpet has been added from patient bed to the bathroom to help reduce risk of slipping on hardwood floors . Discussed the patient is now using a walker instead of a cane o The patient has a rollator for use outside the home . Patient continues to be active with [facility] informed patient and his caregivers she would request order for OT in the home  . [name] requests feedback on frequency patient needs to receive shingles vaccine as well as a colonoscopy o Collaboration with patients primary provider to request feedback o Scheduled follow up call over the next week  Completed 01/28/20 with [name] . Successful outbound call placed to [name] to assess patient goal progression . Determined the patient was prescribed a new medication (Seroquel 25 mg 1/2 tab PO QHS) for sleep on 01/15/20  o [name] reports she was unable to break and/or cut the tablet in half without it crumbling due to size of the table o She reports she has been administering a whole tablet which she feels is effective . Advised the patient SW would collaborate with Dr. Baird Cancer to inform of medication dosage the patient is receiving . Discussed [name] was contacted by [facility] to review order for hospital bed o [name] reported the [place] representative informed her they needed more paperwork from the primary provider office in order to fulfill request o Advised [name] would reach out to Dr. Baird Cancer to confirm receipt of faxed request from [place] . Collaboration with Dr. Baird Cancer to inform of patient medication dose increase based on family inability to cut pill in half . Collaboration with Dr. Baird Cancer to advise [place] planned to send a form for completion in order to fulfill order for  hospital bed  Completed 01/15/20 with [name] . Successful outbound call placed to [name] to assess goal progression . Determined [name] spoke to a staff member on 9/21 regarding patient medication regimen and is awaiting a return call with instructions o [name] reports she spoke with [place] . Discussed [name] is also interested in a prescription for an electric mattress that will assist with raising the  head of the bed and feet . Advised [name] is unsure if the patient will need a prescription for a mattress considering the family does not want a hospital bed o [name] reports this was discussed during patient last OV with Dr. Baird Cancer . Collaboration with Riki Altes requesting she also speak with Dr. Baird Cancer regarding an electric mattress when she follows up on medication questions o Carolyne Fiscal reports she would forward this request to CMA Tianna as Carolyne Fiscal is not who the patients caregiver spoke with . Scheduled follow up call to [name] over the next two weeks  Completed 01/10/20 with [name] and Deeann Cree . Successful outbound call placed to the patients caregivers [name] Drema [place] and [name] . Discussed the patient was seen by his primary provider on 9/16 and given samples of medication to help with sleep o [name] reports she was under the impression this medication was also being called into the patients pharmacy - "It's not at the pharmacy" o Determined [name] is choosing to hold the medication due to concern she will start a new medication and run out prior to the pharmacy receiving a prescription . Determined the patient is not sleeping well and was up all last night . Advised [name] SW would communicate prescription needs to the patients provider for follow up . Collaboration with Dr. Baird Cancer regarding prescription refill needs o Dr Baird Cancer reports she gave a 9 day sample and did not call in a prescription due to waiting to see if new medication would be effective . Successful outbound call to [name] to report above communication from Dr. Baird Cancer and request she start the medication tonight o [name] reports she did in fact give the patient the medication 9/16 and it was ineffective o Advised [name] SW misunderstood previous conversation o [name] reports she will try medication throughout the weekend  . Collaboration  with Dr. Baird Cancer to provide correct information due to SW misunderstanding previous conversation . Scheduled follow up over the next week to assess goal progression  Completed 12/24/19 with [name] . Successful outbound call placed to the patients niece/caregiver to assess goal progression . Determined the patient has been seen recently by his dentist to address concerns with denture use and discuss barriers to eating while using dentures o [name] reports that since the dental appointment the patient has not had any other complaints surrounding denture use or swallowing . Assessed for interventions done to dentures by dentist o [name] reports no changes were made and that since the patient is responding better to use she is no longer concerned . Encouraged [name] to contact care management team as needed to assist with care coordination needs . Scheduled follow up call over the next month  Completed 12/09/19 with [name] . Successful outbound call placed to [name] to assess goal progression . Determined the patient is experiencing some difficulty swallowing o [name] reports the patient will not eat with dentures in  o The patient is having difficulty swallowing food including  applesauce but is not experiencing difficulty with thin liquids o [name] reports she has noted some weight loss as well . Advised [name] the patient would need to be seen by his physician to assess needs and place a referral for home health . Collaboration with patient primary provider Dr. Baird Cancer regarding above information o Informed the patients niece was contacted by Michelle Nasuti, [place] who reports [name] does not want an appointment during the month of August and will bring patient to next scheduled appointment in September . Scheduled follow up call over the next two weeks  Completed 10/31/19 with [name] . Collaboration with Michelle Nasuti, [place] with  primary proivder office who reports contact by [facility] requesting follow up on respite care plans for the patient . Successful outbound call placed to the patients niece, [name] to review previous education surrounding respite care resources o Discussed opportunity to enroll patient with [facility] "Connections" program - family not interested at this time due to cost o Reviewed the patient recent referral to [facility] caregiver respite program- family has yet to be contacted. SW advised the last communication received from [name] was the family would be contacted over the next several weeks . Discussed [name] plans to go out of town for a week and is concerned about leaving the patient and his sister alone in the home o The patients other niece also lives in [place] and is available to help as needed Lyn Hollingshead from [facility] plans to check on the patient daily from Tuesday - Friday . Advised [name] there are no resources to assist with checking on the patient next week other than privately hiring a caregiver o [name] reports this is not an options . Provided education on local resources and the capacity in which their "respite" programs are able to offer assistance . Answered questions surrounding Medicare benefit and what it does/ does not cover in relations to respite caregiver assistance . Educated on the [facility] program including services offered and funding  o Obtained verbal consent to refer patient to [facility] to determine if the patient would qualify for services and what the monthly cost would be . Collaboration with Jola Baptist, SW with [facility]  o Referral received from Mrs. Lorel Monaco who reports plans to contact [name]  . Scheduled follow up over the next month  Patient Self Care Activities:  . Attends all scheduled provider appointments . Calls provider office for new  concerns or questions . Patients sister verbalizes understanding to contact CM team with future care management needs  Please see past updates related to this goal by clicking on the "Past Updates" button in the selected goal          Follow Up Plan: SW will follow up with patient by phone over the next week.   [name], BSW, CDP Social Worker, Certified Dementia Practitioner [facility] [phone]

## 2020-03-09 NOTE — Telephone Encounter (Signed)
Mrs. Zachery Dauer the pt's sister was notified that Dr/. Allyne Gee said the pt's urine is neg for a UTI.  Mrs. Zachery Dauer wants to know if the pt needed to have a shingles vaccination?  Mrs. Zachery Dauer was notified that he needed to get a  Shingles vaccination.

## 2020-03-14 DIAGNOSIS — R54 Age-related physical debility: Secondary | ICD-10-CM | POA: Diagnosis not present

## 2020-03-14 DIAGNOSIS — M6281 Muscle weakness (generalized): Secondary | ICD-10-CM | POA: Diagnosis not present

## 2020-03-18 ENCOUNTER — Other Ambulatory Visit: Payer: Self-pay | Admitting: Internal Medicine

## 2020-03-18 ENCOUNTER — Ambulatory Visit: Payer: Medicare Other

## 2020-03-18 DIAGNOSIS — F0391 Unspecified dementia with behavioral disturbance: Secondary | ICD-10-CM

## 2020-03-18 DIAGNOSIS — I10 Essential (primary) hypertension: Secondary | ICD-10-CM

## 2020-03-18 NOTE — Chronic Care Management (AMB) (Signed)
Chronic Care Management    Social Work Follow Up Note  03/18/2020 Name: Manuel [name] MRN: [id number] DOB: 07-Aug-1932  Manuel [name] is a 86 y.o. year old male who is a primary care patient of [name], MD. The CCM team was consulted for assistance with care coordination.   Review of patient status, including review of consultants reports, other relevant assessments, and collaboration with appropriate care team members and the patient's provider was performed as part of comprehensive patient evaluation and provision of chronic care management services.    SDOH (Social Determinants of Health) assessments performed: No    Outpatient Encounter Medications as of 03/18/2020  Medication Sig   acetaminophen (TYLENOL) 500 MG tablet Take 1,000 mg by mouth every 6 (six) hours as needed for moderate pain.   amLODipine (NORVASC) 5 MG tablet TAKE 1 TABLET BY MOUTH EVERY DAY   Ascorbic Acid (VITAMIN C PO) Take 100 mg by mouth daily.   aspirin EC 81 MG tablet Take 81 mg by mouth every evening.    Calcium Carbonate Antacid (CALCIUM CARBONATE PO) Take 100 mg by mouth. 3 x weekly   cholecalciferol (VITAMIN D3) 25 MCG (1000 UT) tablet Take 2,000 Units by mouth every evening.   diphenhydrAMINE HCl, Sleep, (ZZZQUIL PO) Take 1 tablet by mouth at bedtime as needed.   losartan-hydrochlorothiazide (HYZAAR) 100-25 MG tablet TAKE 1 TABLET BY MOUTH EVERY DAY   memantine (NAMENDA) 10 MG tablet TAKE 1 TABLET BY MOUTH TWICE A DAY   QUEtiapine (SEROQUEL) 25 MG tablet Take 1 tablet (25 mg total) by mouth daily.   tamsulosin (FLOMAX) 0.4 MG CAPS capsule TAKE 1 CAPSULE BY MOUTH EVERY DAY   vitamin E 200 UNIT capsule Take 200 Units by mouth every evening.   No facility-administered encounter medications on file as of 03/18/2020.     Goals Addressed            This Visit's Progress    Assist with ongoing care management and care coordination needs   On track    Manuel [name] (see  longitudinal plan of care for additional care plan information)  Current Barriers:   Ongoing chronic conditions including HTN and Dementia  Limited ability to manage own care coordination needs due to limitations from dementia  Social Work Clinical Goal(s):   Over the next 120 days the patient will work with CM team to address ongoing care management concerns. Goal not met due to scheduling limitations surrounding COVID 19 pandemic  New 10/10/19- Over the next 60 days the patient and his caregivers will work with SW to become more knowledgeable of respite resources available to the patient Goal Met  New 01/10/20 Over the next 14 days the patient and his caregiver will follow up with patients primary provider to address ongoing insomnia Goal Met  New 01/15/20 Over the next 30 days the patient and his caregiver will work patient primary provider to obtain a prescription for an electric mattress Goal Met  New 03/09/20 Over the next 90 days the patient and his caregiver will work with SW to address care coordination needs  CCM SW Interventions: Completed 03/18/20 with Manuel [name]  Successful outbound call placed to patients niece/caregiver to assist with care coordination needs  Manuel [name] of the following communication received from Manuel [name] in response to SW message requesting feedback on patient need for a shingles vaccine as well as a colonoscopy "We can discuss at his next visit. Depends on which shingles vaccine he  has received. If he has received Shingrix, he does not need any more vaccines.  Typically, GI will not perform colonoscopy over age 7.  I will discuss specifics with them at next visit. "  Manuel [name] reports understanding and will plan to follow up with Manuel [name] during the next OV  Discussed Manuel [name] interest in beginning OT services in the home in an effort to preserve functioning level o Manuel [name] reports the patients [facility] RN contacted the  practice and spoke with [place] regarding home health orders o "Manuel [name] said Manuel [name] told her the order was sent to [facility] but I have not heard from them"  Performed chart review to note last home health order was placed in August of 2021  Advised Manuel [name] a new order would need to be placed   Discussed the patient may need a new OV based on Medicare guidelines as the last OV was in September 2021  Collaboration with Manuel [name] to inquire if she is in agreement with orders for OT and to determine if the patient will need an OV prior to orders being placed  Completed 03/09/20 with Manuel [name]  Successful outbound call placed to the patients niece/caregiver Manuel [name]  Confirmed receipt of hospital bed o Manuel [name] reports patient did experience a fall from the bed since delivery but states it was due to removing rails o Rails have been placed back on bed o A carpet has been added from patient bed to the bathroom to help reduce risk of slipping on hardwood floors  Discussed the patient is now using a walker instead of a cane o The patient has a rollator for use outside the home  Patient continues to be active with [facility] offered by [facility] informed patient and his caregivers she would request order for OT in the home   Manuel [name] requests feedback on frequency patient needs to receive shingles vaccine as well as a colonoscopy o Collaboration with patients primary provider to request feedback o Scheduled follow up call over the next week  Completed 01/28/20 with Manuel [name]  Successful outbound call placed to Manuel [name] to assess patient goal progression  Determined the patient was prescribed a new medication (Seroquel 25 mg 1/2 tab PO QHS) for sleep on 01/15/20  o Manuel [name] reports she was unable to break and/or cut the tablet in half without it crumbling due to size of the table o She reports she  has been administering a whole tablet which she feels is effective  Advised the patient SW would collaborate with Manuel [name] to inform of medication dosage the patient is receiving  Discussed Manuel [name] was contacted by [facility] to review order for hospital bed o Manuel [name] reported the [facility] representative informed her they needed more paperwork from the primary provider office in order to fulfill request o Advised Manuel [name] SW would reach out to Manuel [name] to confirm receipt of faxed request from [place] with Manuel [name] to inform of patient medication dose increase based on family inability to cut pill in half  Collaboration with Manuel [name] to advise [place] planned to send a form for completion in order to fulfill order for hospital bed  Completed 01/15/20 with Manuel [name]  Successful outbound call placed to Manuel [name] to assess goal progression  Determined Manuel [name] spoke to a staff member on 9/21 regarding patient medication  regimen and is awaiting a return call with instructions o Manuel [name] reports she spoke with Carolyne Fiscal  Discussed Manuel [name] is also interested in a prescription for an electric mattress that will assist with raising the head of the bed and feet  Advised Manuel [name] SW is unsure if the patient will need a prescription for a mattress considering the family does not want a hospital bed o Manuel [name] reports this was discussed during patient last OV with Manuel [name]  Collaboration with Riki Altes requesting she also speak with Manuel [name] regarding an electric mattress when she follows up on medication questions o Carolyne Fiscal reports she would forward this request to CMA [place] as Carolyne Fiscal is not who the patients caregiver spoke with  Scheduled follow up call to Mrs Dennard [name] over the next two weeks  Completed 01/10/20 with Manuel [name] and Deeann Cree  Successful outbound call placed to the patients  caregivers Manuel [name] and Manuel [name]  Discussed the patient was seen by his primary provider on 9/16 and given samples of medication to help with sleep o Manuel [name] reports she was under the impression this medication was also being called into the patients pharmacy - "It's not at the pharmacy" o Determined Manuel [name] is choosing to hold the medication due to concern she will start a new medication and run out prior to the pharmacy receiving a prescription  Determined the patient is not sleeping well and was up all last night  Advised Manuel [name] SW would communicate prescription needs to the patients provider for follow up  Collaboration with Manuel [name] regarding prescription refill needs o Dr Baird [name] reports she gave a 9 day sample and did not call in a prescription due to waiting to see if new medication would be effective  Successful outbound call to Manuel [name] to report above communication from Manuel [name] and request she start the medication tonight o Manuel [name] reports she did in fact give the patient the medication 9/16 and it was ineffective o Advised Mrs Dennard [name] SW misunderstood previous conversation o Mrs Dennard [name] reports she will try medication throughout the weekend   Collaboration with Manuel [name] to provide correct information due to SW misunderstanding previous conversation  Scheduled follow up over the next week to assess goal progression  Completed 12/24/19 with Manuel [name]  Successful outbound call placed to the patients niece/caregiver to assess goal progression  Determined the patient has been seen recently by his dentist to address concerns with denture use and discuss barriers to eating while using dentures o Manuel [name] reports that since the dental appointment the patient has not had any other complaints surrounding denture use or swallowing  Assessed for interventions done to dentures by dentist o Manuel [name] reports no changes were made and that  since the patient is responding better to use she is no longer concerned  Encouraged Manuel [name] to contact care management team as needed to assist with care coordination needs  Scheduled follow up call over the next month  Completed 12/09/19 with Manuel [name]  Successful outbound call placed to Manuel [name] to assess goal progression  Determined the patient is experiencing some difficulty swallowing o Manuel [name] reports the patient will not eat with dentures in  o The patient is having difficulty swallowing food including applesauce but is not experiencing difficulty with thin liquids o Manuel [name] reports she has noted some weight loss as well  Advised Manuel [name] the patient would need to be seen by his  physician to assess needs and place a referral for home health  Collaboration with patient primary provider Manuel [name] regarding above information o Informed the patients niece was contacted by Michelle Nasuti, [place] who reports Manuel [name] does not want an appointment during the month of August and will bring patient to next scheduled appointment in September  Scheduled follow up call over the next two weeks  Completed 10/31/19 with [place] with Michelle Nasuti, [place] with primary proivder office who reports contact by [facility] requesting follow up on respite care plans for the patient  Successful outbound call placed to the patients niece, Manuel [name] to review previous education surrounding respite care resources o Discussed opportunity to enroll patient with [facility]" program - family not interested at this time due to cost o Reviewed the patient recent referral to [facility] caregiver respite program- family has yet to be contacted. SW advised the last communication received from [name] was the family would be contacted over the next several weeks  Discussed Manuel [name] plans to go out of town for a  week and is concerned about leaving the patient and his sister alone in the home o The patients other niece also lives in [place] and is available to help as needed Lyn Hollingshead from [facility] plans to check on the patient daily from Tuesday - Friday  Advised Manuel [name] there are no resources to assist with checking on the patient next week other than privately hiring a caregiver o Manuel [name] reports this is not an options  Provided education on local resources and the capacity in which their "respite" programs are able to offer assistance  Answered questions surrounding Medicare benefit and what it does/ does not cover in relations to respite caregiver assistance  Educated on the [facility] program including services offered and funding  o Obtained verbal consent to refer patient to [facility] to determine if the patient would qualify for services and what the monthly cost would be  Collaboration with Jola Baptist, SW with [facility]  o Referral received from Mrs. Lorel Monaco who reports plans to contact Manuel [name]   Scheduled follow up over the next month  Patient Self Care Activities:   Attends all scheduled provider appointments  Calls provider office for new concerns or questions  Patients sister verbalizes understanding to contact CM team with future care management needs  Please see past updates related to this goal by clicking on the "Past Updates" button in the selected goal          Follow Up Plan: SW will follow up with patient by phone over the next 3 weeks,   [name], BSW, CDP Social Worker, Certified Dementia Practitioner [place] [facility] [place] [facility] [phone]  Total time spent performing care coordination and/or care management activities with the patient by phone or face to face = 25 minutes.

## 2020-03-18 NOTE — Patient Instructions (Signed)
Social Worker Visit Information  Goals we discussed today:  Goals Addressed            This Visit's Progress   . Assist with ongoing care management and care coordination needs   On track    [place] (see longitudinal plan of care for additional care plan information)  Current Barriers:  . Ongoing chronic conditions including HTN and Dementia . Limited ability to manage own care coordination needs due to limitations from dementia  Social Work Clinical Goal(s):  [name] Over the next 120 days the patient will work with CM team to address ongoing care management concerns. Goal not met due to scheduling limitations surrounding COVID 19 pandemic . New 10/10/19- Over the next 60 days the patient and his caregivers will work with SW to become more knowledgeable of respite resources available to the patient Goal Met . New 01/10/20 Over the next 14 days the patient and his caregiver will follow up with patients primary provider to address ongoing insomnia Goal Met . New 01/15/20 Over the next 30 days the patient and his caregiver will work patient primary provider to obtain a prescription for an electric mattress Goal Met . New 03/09/20 Over the next 90 days the patient and his caregiver will work with SW to address care coordination needs  CCM SW Interventions: Completed 03/18/20 with [name] . Successful outbound call placed to patients niece/caregiver to assist with care coordination needs . Rolm Gala of the following communication received from Dr. Baird Cancer in response to SW message requesting feedback on patient need for a shingles vaccine as well as a colonoscopy "We can discuss at his next visit. Depends on which shingles vaccine he has received. If he has received Shingrix, he does not need any more vaccines.  Typically, GI will not perform colonoscopy over age 45.  I will discuss specifics with them at next visit. " . [name] reports understanding and will plan to follow up  with Dr. Baird Cancer during the next OV . Discussed [name] interest in beginning OT services in the home in an effort to preserve functioning level o [name] reports the patients [facility] RN contacted the practice and spoke with [place] regarding home health orders o "Almyra Free said [place] told her the order was sent to [facility] but I have not heard from them" . Performed chart review to note last home health order was placed in August of 2021 . Advised [name] a new order would need to be placed  . Discussed the patient may need a new OV based on Medicare guidelines as the last OV was in September 2021 . Collaboration with Dr. Baird Cancer to inquire if she is in agreement with orders for OT and to determine if the patient will need an OV prior to orders being placed  Completed 03/09/20 with [name] . Successful outbound call placed to the patients niece/caregiver [name] . Confirmed receipt of hospital bed o [name] reports patient did experience a fall from the bed since delivery but states it was due to removing rails o Rails have been placed back on bed o A carpet has been added from patient bed to the bathroom to help reduce risk of slipping on hardwood floors . Discussed the patient is now using a walker instead of a cane o The patient has a rollator for use outside the home . Patient continues to be active with [facility] informed patient and his caregivers she would request order for OT in the home  . [name] requests feedback on frequency patient needs to receive shingles vaccine as well as a colonoscopy o Collaboration with patients primary provider to request feedback o Scheduled follow up call over the next week  Completed 01/28/20 with [name] . Successful outbound call placed to [name] to assess patient goal progression . Determined the patient was  prescribed a new medication (Seroquel 25 mg 1/2 tab PO QHS) for sleep on 01/15/20  o [name] reports she was unable to break and/or cut the tablet in half without it crumbling due to size of the table o She reports she has been administering a whole tablet which she feels is effective . Advised the patient SW would collaborate with Dr. Baird Cancer to inform of medication dosage the patient is receiving . Discussed [name] was contacted by [place] to review order for hospital bed o [name] reported the [place] representative informed her they needed more paperwork from the primary provider office in order to fulfill request o Advised [name] would reach out to Dr. Baird Cancer to confirm receipt of faxed request from [place] . Collaboration with Dr. Baird Cancer to inform of patient medication dose increase based on family inability to cut pill in half . Collaboration with Dr. Baird Cancer to advise [place] planned to send a form for completion in order to fulfill order for hospital bed  Completed 01/15/20 with [name] . Successful outbound call placed to [name] to assess goal progression . Determined [name] spoke to a staff member on 9/21 regarding patient medication regimen and is awaiting a return call with instructions o [name] reports she spoke with [place] . Discussed [name] is also interested in a prescription for an electric mattress that will assist with raising the head of the bed and feet . Advised [name] is unsure if the patient will need a prescription for a mattress considering the family does not want a hospital bed o [name] reports this was discussed during patient last OV with Dr. Baird Cancer . Collaboration with Riki Altes requesting she also speak with Dr. Baird Cancer regarding an electric mattress when she follows up on medication questions o Carolyne Fiscal reports she would forward this request to CMA [place] as Carolyne Fiscal is  not who the patients caregiver spoke with . Scheduled follow up call to [name] over the next two weeks  Completed 01/10/20 with [name] and Deeann Cree . Successful outbound call placed to the patients caregivers [name] Drema [place] and [name] . Discussed the patient was seen by his primary provider on 9/16 and given samples of medication to help with sleep o [name] reports she was under the impression this medication was also being called into the patients pharmacy - "It's not at the pharmacy" o Determined [name] is choosing to hold the medication due to concern she will start a new medication and run out prior to the pharmacy receiving a prescription . Determined the patient is not sleeping well and was up all last night . Advised [name] SW would communicate prescription needs to the patients provider for follow up . Collaboration with Dr. Baird Cancer regarding prescription refill needs o Dr Baird Cancer reports she gave a 9 day sample and did not call in a prescription due to waiting to see if new medication would be effective . Successful  outbound call to [name] to report above communication from Dr. Baird Cancer and request she start the medication tonight o [name] reports she did in fact give the patient the medication 9/16 and it was ineffective o Advised [name] SW misunderstood previous conversation o [name] reports she will try medication throughout the weekend  . Collaboration with Dr. Baird Cancer to provide correct information due to SW misunderstanding previous conversation . Scheduled follow up over the next week to assess goal progression  Completed 12/24/19 with [name] . Successful outbound call placed to the patients niece/caregiver to assess goal progression . Determined the patient has been seen recently by his dentist to address concerns with denture use and discuss barriers to eating while using dentures o [name] reports that since  the dental appointment the patient has not had any other complaints surrounding denture use or swallowing . Assessed for interventions done to dentures by dentist o [name] reports no changes were made and that since the patient is responding better to use she is no longer concerned . Encouraged [name] to contact care management team as needed to assist with care coordination needs . Scheduled follow up call over the next month  Completed 12/09/19 with [name] . Successful outbound call placed to [name] to assess goal progression . Determined the patient is experiencing some difficulty swallowing o [name] reports the patient will not eat with dentures in  o The patient is having difficulty swallowing food including applesauce but is not experiencing difficulty with thin liquids o [name] reports she has noted some weight loss as well . Advised [name] the patient would need to be seen by his physician to assess needs and place a referral for home health . Collaboration with patient primary provider Dr. Baird Cancer regarding above information o Informed the patients niece was contacted by Michelle Nasuti, [place] who reports [name] does not want an appointment during the month of August and will bring patient to next scheduled appointment in September . Scheduled follow up call over the next two weeks  Completed 10/31/19 with [name] . Collaboration with Michelle Nasuti, [place] with primary proivder office who reports contact by [facility] requesting follow up on respite care plans for the patient . Successful outbound call placed to the patients niece, [name] to review previous education surrounding respite care resources o Discussed opportunity to enroll patient with [facility] "Connections" program - family not interested at this time due to cost o Reviewed the patient recent referral to [facility] caregiver respite  program- family has yet to be contacted. SW advised the last communication received from [name] was the family would be contacted over the next several weeks . Discussed [name] plans to go out of town for a week and is concerned about leaving the patient and his sister alone in the home o The patients other niece also lives in [place] and is available to help as needed Lyn Hollingshead from [facility] plans to check on the patient daily from Tuesday - Friday . Advised [name] there are no resources to assist with checking on the patient next week other than privately hiring a caregiver o [name] reports this is not an options . Provided education on local resources and the capacity in which their "respite" programs are able to offer assistance . Answered questions surrounding Medicare benefit and what it does/ does not cover in relations to respite caregiver  assistance . Educated on the [facility] program including services offered and funding  o Obtained verbal consent to refer patient to [facility] to determine if the patient would qualify for services and what the monthly cost would be . Collaboration with Jola Baptist, SW with [facility]  o Referral received from Mrs. Lorel Monaco who reports plans to contact [name]  . Scheduled follow up over the next month  Patient Self Care Activities:  . Attends all scheduled provider appointments . Calls provider office for new concerns or questions . Patients sister verbalizes understanding to contact CM team with future care management needs  Please see past updates related to this goal by clicking on the "Past Updates" button in the selected goal          Follow Up Plan: SW will follow up with patient by phone over the next three weeks.   [name], BSW, CDP Social Worker, Certified Dementia Practitioner [place] [facility] [place] [facility] [phone]

## 2020-03-24 ENCOUNTER — Telehealth: Payer: Self-pay

## 2020-03-24 ENCOUNTER — Other Ambulatory Visit: Payer: Self-pay | Admitting: Internal Medicine

## 2020-03-24 DIAGNOSIS — F0391 Unspecified dementia with behavioral disturbance: Secondary | ICD-10-CM

## 2020-03-24 NOTE — Telephone Encounter (Signed)
I left the pt's niece Ms Tanya Nones a message that I was returning her call.

## 2020-04-03 ENCOUNTER — Other Ambulatory Visit: Payer: Self-pay | Admitting: Diagnostic Neuroimaging

## 2020-04-03 ENCOUNTER — Ambulatory Visit: Payer: Medicare Other

## 2020-04-03 DIAGNOSIS — F0391 Unspecified dementia with behavioral disturbance: Secondary | ICD-10-CM

## 2020-04-03 DIAGNOSIS — I10 Essential (primary) hypertension: Secondary | ICD-10-CM

## 2020-04-03 NOTE — Patient Instructions (Signed)
   Goals we discussed today:  Goals Addressed    . Work with SW to manage care coordination needs       Timeframe:  Long-Range Goal Priority:  Low Start Date:   12.10.21                          Expected End Date: 3.10.22                   Next Expected date of contact: 12.15.21  Patient Goals/Self-Care Activities Over the next 30 days, patient will: With the help of his sister and niece  - Patient will self administer medications as prescribed Patient will attend all scheduled provider appointments Patient will call pharmacy for medication refills Patient will call provider office for new concerns or questions Contact SW as needed prior to next scheduled call

## 2020-04-03 NOTE — Chronic Care Management (AMB) (Signed)
Chronic Care Management    Social Work Follow Up Note  04/03/2020 Name: Manuel [name] MRN: [id number] DOB: 01-15-1933  Manuel [name] is a 86 y.o. year old male who is a primary care patient of Dorothyann Peng, MD. The CCM team was consulted for assistance with care coordination.   Review of patient status, including review of consultants reports, other relevant assessments, and collaboration with appropriate care team members and the patient's provider was performed as part of comprehensive patient evaluation and provision of chronic care management services.    SDOH (Social Determinants of Health) assessments performed: No    Outpatient Encounter Medications as of 04/03/2020  Medication Sig  . acetaminophen (TYLENOL) 500 MG tablet Take 1,000 mg by mouth every 6 (six) hours as needed for moderate pain.  [name] amLODipine (NORVASC) 5 MG tablet TAKE 1 TABLET BY MOUTH EVERY DAY  . Ascorbic Acid (VITAMIN C PO) Take 100 mg by mouth daily.  [name] aspirin EC 81 MG tablet Take 81 mg by mouth every evening.   . Calcium Carbonate Antacid (CALCIUM CARBONATE PO) Take 100 mg by mouth. 3 x weekly  . cholecalciferol (VITAMIN D3) 25 MCG (1000 UT) tablet Take 2,000 Units by mouth every evening.  . diphenhydrAMINE HCl, Sleep, (ZZZQUIL PO) Take 1 tablet by mouth at bedtime as needed.  [name] losartan-hydrochlorothiazide (HYZAAR) 100-25 MG tablet TAKE 1 TABLET BY MOUTH EVERY DAY  . memantine (NAMENDA) 10 MG tablet TAKE 1 TABLET BY MOUTH TWICE A DAY  . QUEtiapine (SEROQUEL) 25 MG tablet TAKE 1 TABLET BY MOUTH EVERY DAY  . tamsulosin (FLOMAX) 0.4 MG CAPS capsule TAKE 1 CAPSULE BY MOUTH EVERY DAY  . vitamin E 200 UNIT capsule Take 200 Units by mouth every evening.   No facility-administered encounter medications on file as of 04/03/2020.     Patient Care Plan: Social Work Care Plan    Problem Identified: Care Coordination   Priority: Low    Long-Range Goal: Collaborate with RN Care Manager to perform  appropriate assessments to assist with care coordination needs   Start Date: 04/03/2020  Expected End Date: 07/02/2020  This Visit's Progress: On track  Priority: Low  Note:   Current Barriers:  . ADL IADL limitations . Memory Deficits . Chronic conditions including HTN, Dementia, and CKD III which put patient at increased risk of hospitaliation . Unable to self administer medications as prescribed  Social Work Clinical Goal(s):  [name] Over the next 30 days the patient will work with Home Health OT as ordered by his primary care physician . Over the next 120 days the patient will work with SW to address care coordination needs  CCM SW Interventions: Completed 04/03/20 . Inter-disciplinary care team collaboration (see longitudinal plan of care) . Successful outbound call placed to the patients sister and caregiver [name]  . Discussed the patient is doing well but continuing to experience some falls - patients sister and niece are unable to help the patient off the floor so they contact a local fire department for assistance . Performed chart review to note orders for OT were placed by Dr. Allyne Gee 03/24/20  . Determined the patient has not yet began working with home health . Advised Mrs. Zachery Dauer SW would follow up with [facility] to determine referral status . Outbound call placed to [facility] to determine the patients referral was declined due to patients insurance . Collaboration with Dr. Allyne Gee to inform of declination and request orders be sent to an alternative home health agency  Patient Goals/Self-Care Activities Over the next 30 days, patient will: With the help of his sister and niece  - Patient will self administer medications as prescribed Patient will attend all scheduled provider appointments Patient will call pharmacy for medication refills Patient will call provider office for new concerns or questions Contact SW as needed prior to next scheduled call  Follow up Plan:  SW will follow up with patient by phone over the next 10 days        Follow Up Plan: SW will follow up with patient by phone over the next 10 days.   Bevelyn Ngo, BSW, CDP Social Worker, Certified Dementia Practitioner [facility] Care Management [phone]  Total time spent performing care coordination and/or care management activities with the patient by phone or face to face = 12 minutes.

## 2020-04-06 ENCOUNTER — Telehealth: Payer: Self-pay | Admitting: Diagnostic Neuroimaging

## 2020-04-06 MED ORDER — MEMANTINE HCL 10 MG PO TABS: 10.0000 mg | ORAL_TABLET | Freq: Two times a day (BID) | ORAL | 0 refills | Status: DC

## 2020-04-06 NOTE — Telephone Encounter (Signed)
Memantine refilled x 3 months pending FU in Jan.

## 2020-04-06 NOTE — Telephone Encounter (Signed)
Pt is needing a refill on his memantine (NAMENDA) 10 MG tablet sent in to the [facility] on [address]. Pt is scheduled for a Medication management appt in Jan 2022

## 2020-04-08 ENCOUNTER — Ambulatory Visit: Payer: Medicare Other

## 2020-04-08 DIAGNOSIS — I10 Essential (primary) hypertension: Secondary | ICD-10-CM

## 2020-04-08 DIAGNOSIS — F0391 Unspecified dementia with behavioral disturbance: Secondary | ICD-10-CM

## 2020-04-08 NOTE — Patient Instructions (Signed)
   Goals we discussed today:  Goals Addressed            This Visit's Progress   . Work with SW to manage care coordination needs       Timeframe:  Long-Range Goal Priority:  Low Start Date:   12.10.21                          Expected End Date: 3.10.22                   Next Expected date of contact: 1.4.22  Patient Goals/Self-Care Activities Over the next 30 days, patient will: With the help of his sister and niece  - Patient will self administer medications as prescribed -Patient will attend all scheduled provider appointments -Patient will call pharmacy for medication refills -Patient will call provider office for new concerns or questions -Contact SW as needed prior to next scheduled call

## 2020-04-08 NOTE — Chronic Care Management (AMB) (Signed)
Chronic Care Management    Social Work Follow Up Note  04/08/2020 Name: Manuel [name] MRN: [id number] DOB: 1932-05-17  Manuel [name] is a 86 y.o. year old male who is a primary care patient of Dorothyann Peng, MD. The CCM team was consulted for assistance with care coordination.   Review of patient status, including review of consultants reports, other relevant assessments, and collaboration with appropriate care team members and the patient's provider was performed as part of comprehensive patient evaluation and provision of chronic care management services.    SDOH (Social Determinants of Health) assessments performed: No    Outpatient Encounter Medications as of 04/08/2020  Medication Sig  . acetaminophen (TYLENOL) 500 MG tablet Take 1,000 mg by mouth every 6 (six) hours as needed for moderate pain.  [name] amLODipine (NORVASC) 5 MG tablet TAKE 1 TABLET BY MOUTH EVERY DAY  . Ascorbic Acid (VITAMIN C PO) Take 100 mg by mouth daily.  [name] aspirin EC 81 MG tablet Take 81 mg by mouth every evening.   . Calcium Carbonate Antacid (CALCIUM CARBONATE PO) Take 100 mg by mouth. 3 x weekly  . cholecalciferol (VITAMIN D3) 25 MCG (1000 UT) tablet Take 2,000 Units by mouth every evening.  . diphenhydrAMINE HCl, Sleep, (ZZZQUIL PO) Take 1 tablet by mouth at bedtime as needed.  [name] losartan-hydrochlorothiazide (HYZAAR) 100-25 MG tablet TAKE 1 TABLET BY MOUTH EVERY DAY  . memantine (NAMENDA) 10 MG tablet Take 1 tablet (10 mg total) by mouth 2 (two) times daily.  . QUEtiapine (SEROQUEL) 25 MG tablet TAKE 1 TABLET BY MOUTH EVERY DAY  . tamsulosin (FLOMAX) 0.4 MG CAPS capsule TAKE 1 CAPSULE BY MOUTH EVERY DAY  . vitamin E 200 UNIT capsule Take 200 Units by mouth every evening.   No facility-administered encounter medications on file as of 04/08/2020.     Patient Care Plan: Social Work Care Plan    Problem Identified: Care Coordination   Priority: Low    Long-Range Goal: Collaborate with RN Care  Manager to perform appropriate assessments to assist with care coordination needs   Start Date: 04/03/2020  Expected End Date: 07/02/2020  This Visit's Progress: On track  Recent Progress: On track  Priority: Low  Note:   Current Barriers:  . ADL IADL limitations . Memory Deficits . Chronic conditions including HTN, Dementia, and CKD III which put patient at increased risk of hospitaliation . Unable to self administer medications as prescribed  Social Work Clinical Goal(s):  [name] Over the next 30 days the patient will work with Home Health OT as ordered by his primary care physician . Over the next 120 days the patient will work with SW to address care coordination needs  CCM SW Interventions: Completed 04/08/20 . Inter-disciplinary care team collaboration (see longitudinal plan of care) . Successful outbound call placed to the patients niece [name] to follow up on status of home health referral . Determined the patient has not engaged with home health due to concerns with locating an agency who can accept the referral . Discussed Sheralyn Boatman has been in contact with the practice referral coordinator who is actively working to find an agency to accept the patients referral . Scheduled follow up call over the next month  Patient Goals/Self-Care Activities Over the next 30 days, patient will: With the help of his sister and niece  - Patient will self administer medications as prescribed Patient will attend all scheduled provider appointments Patient will call pharmacy for medication refills Patient will  call provider office for new concerns or questions Contact SW as needed prior to next scheduled call  Follow up Plan: SW will follow up with patient by phone over the next month      Bevelyn Ngo, BSW, CDP Social Worker, Certified Dementia Practitioner [facility] Care Management [phone]  Total time spent performing care coordination and/or care management activities with the  patient by phone or face to face = 10 minutes.

## 2020-04-10 ENCOUNTER — Other Ambulatory Visit: Payer: Self-pay | Admitting: Internal Medicine

## 2020-04-10 ENCOUNTER — Other Ambulatory Visit: Payer: Self-pay | Admitting: Diagnostic Neuroimaging

## 2020-04-13 DIAGNOSIS — R54 Age-related physical debility: Secondary | ICD-10-CM | POA: Diagnosis not present

## 2020-04-13 DIAGNOSIS — M6281 Muscle weakness (generalized): Secondary | ICD-10-CM | POA: Diagnosis not present

## 2020-04-23 ENCOUNTER — Ambulatory Visit: Payer: Medicare Other | Admitting: Nurse Practitioner

## 2020-04-23 ENCOUNTER — Other Ambulatory Visit: Payer: Self-pay

## 2020-04-23 ENCOUNTER — Encounter: Payer: Self-pay | Admitting: Nurse Practitioner

## 2020-04-23 VITALS — BP 118/70 | HR 89 | Temp 98.3°F | Ht 67.0 in | Wt 197.4 lb

## 2020-04-23 DIAGNOSIS — M7989 Other specified soft tissue disorders: Secondary | ICD-10-CM | POA: Diagnosis not present

## 2020-04-23 DIAGNOSIS — R0602 Shortness of breath: Secondary | ICD-10-CM | POA: Diagnosis not present

## 2020-04-23 MED ORDER — QUETIAPINE FUMARATE 50 MG PO TABS: 50.0000 mg | ORAL_TABLET | Freq: Every day | ORAL | 1 refills | Status: DC

## 2020-04-23 NOTE — Progress Notes (Signed)
I,Manuel [name] as a Education administrator for [employer], FNP.,have documented all relevant documentation on the behalf of Manuel Brine, FNP,as directed by  Manuel Brine, FNP while in the presence of Manuel [name], Manuel [name]. This visit occurred during the SARS-CoV-2 public health emergency.  Safety protocols were in place, including screening questions prior to the visit, additional usage of staff PPE, and extensive cleaning of exam room while observing appropriate contact time as indicated for disinfecting solutions.  Subjective:     Manuel [name] ID: Manuel [name] , male    DOB: October 08, 1932 , 86 y.o.   MRN: [id number]   Chief Complaint  Manuel [name] presents with  . Edema    Manuel [name] stated Manuel [name] has been having some swelling in Manuel legs and feet that has been going on for a while.     HPI  Manuel [name] is having some swelling in Manuel legs and feet. Manuel [name] denies having any pain at this time.   Swelling to left foot and both hands. Unsure when the swelling started.  Manuel [name] also feels they darker in color.  Manuel [name] does not drink much water. Manuel [name] is not elevating Manuel feet much.  Manuel [name] feels Manuel [name] has been having shortness of breath from walking.  When bending down to reach Manuel shoes.  Manuel [name] has had 2 falls since Manuel last visit where the fire department will come to help get him up.   Manuel [name] is also concerned about him still not sleeping while on the seroquel $RemoveBefo'25mg'hbJingslZhS$  wants something stronger.     Past Medical History:  Diagnosis Date  . Alzheimer's dementia ([place])   . Hypertension   . Prostate disorder      Family History  Problem Relation Age of Onset  . Early death Mother   . Prostate cancer Father      Current Outpatient Medications:  .  acetaminophen (TYLENOL) 500 MG tablet, Take 1,000 mg by mouth every 6 (six) hours as needed for moderate pain., Disp: , Rfl:  .  Ascorbic Acid (VITAMIN C PO), Take 100 mg by mouth daily., Disp: , Rfl:  .  aspirin EC 81 MG tablet, Take 81 mg by mouth every evening. , Disp: , Rfl:   .  Calcium Carbonate Antacid (CALCIUM CARBONATE PO), Take 100 mg by mouth. 3 x weekly, Disp: , Rfl:  .  cholecalciferol (VITAMIN D3) 25 MCG (1000 UT) tablet, Take 2,000 Units by mouth every evening., Disp: , Rfl:  .  diphenhydrAMINE HCl, Sleep, (ZZZQUIL PO), Take 1 tablet by mouth at bedtime as needed., Disp: , Rfl:  .  losartan-hydrochlorothiazide (HYZAAR) 100-25 MG tablet, TAKE 1 TABLET BY MOUTH EVERY DAY, Disp: 90 tablet, Rfl: 1 .  memantine (NAMENDA) 10 MG tablet, Take 1 tablet (10 mg total) by mouth 2 (two) times daily., Disp: 180 tablet, Rfl: 0 .  QUEtiapine (SEROQUEL) 50 MG tablet, Take 1 tablet (50 mg total) by mouth at bedtime., Disp: 90 tablet, Rfl: 1 .  tamsulosin (FLOMAX) 0.4 MG CAPS capsule, TAKE 1 CAPSULE BY MOUTH EVERY DAY, Disp: 90 capsule, Rfl: 1 .  vitamin E 200 UNIT capsule, Take 200 Units by mouth every evening., Disp: , Rfl:  .  amLODipine (NORVASC) 5 MG tablet, TAKE 1 TABLET BY MOUTH EVERY DAY, Disp: 90 tablet, Rfl: 3   Allergies  Allergen Reactions  . Penicillins Swelling    Did it involve swelling of the face/tongue/throat, SOB, or low BP? Yes Did it involve sudden or severe rash/hives, skin peeling, or any reaction on the  inside of your mouth or nose? No Did you need to seek medical attention at a hospital or doctor's office? No When did it last happen?2013 If all above answers are "NO", may proceed with cephalosporin use.      Review of Systems  Constitutional: Negative.   Respiratory: Negative.  Negative for cough and wheezing.   Cardiovascular: Negative.   Psychiatric/Behavioral: Negative.      Today's Vitals   04/23/20 1149  BP: 118/70  Pulse: 89  Temp: 98.3 F (36.8 C)  TempSrc: Oral  Weight: 197 lb 6.4 oz (89.5 kg)  Height: $Remove'5\' 7"'aNKVoWM$  (1.702 m)  PainSc: 0-No pain   Body mass index is 30.92 kg/m.   Objective:  Physical Exam Constitutional:      General: Manuel [name] is not in acute distress.    Appearance: Normal appearance. Manuel [name] is obese.   Cardiovascular:     Rate and Rhythm: Normal rate and regular rhythm.     Pulses: Normal pulses.     Heart sounds: Normal heart sounds. No murmur heard.   Pulmonary:     Effort: Pulmonary effort is normal. No respiratory distress.     Breath sounds: Normal breath sounds.  Musculoskeletal:     Right lower leg: Edema present.     Left lower leg: No edema.  Neurological:     General: No focal deficit present.     Mental Status: Manuel [name] is alert and oriented to person, place, and time.     Cranial Nerves: No cranial nerve deficit.     Motor: No weakness.  Psychiatric:        Mood and Affect: Mood normal.        Behavior: Behavior normal.        Thought Content: Thought content normal.        Judgment: Judgment normal.         Assessment And Plan:     1. Left leg swelling  I have encouraged him to wear support socks and to avoid sitting up with legs dangling.   Will check BNP and vascular doppler - BMP8+eGFR - Brain natriuretic peptide - VAS [place] LOWER EXTREMITY VENOUS (DVT); Future  2. Swelling of left hand Encouraged to keep elevated will make sure not related to increased amount of fluid    Manuel [name] was given opportunity to ask questions. Manuel [name] verbalized understanding of the plan and was able to repeat key elements of the plan. All questions were answered to their satisfaction.  Manuel Brine, FNP    I, Manuel Brine, FNP, have reviewed all documentation for this visit. The documentation on 05/17/20 for the exam, diagnosis, procedures, and orders are all accurate and complete.   THE Manuel [name] IS ENCOURAGED TO PRACTICE SOCIAL DISTANCING DUE TO THE COVID-19 PANDEMIC.

## 2020-04-23 NOTE — Patient Instructions (Signed)
Edema  Edema is when you have too much fluid in your body or under your skin. Edema may make your legs, feet, and ankles swell up. Swelling is also common in looser tissues, like around your eyes. This is a common condition. It gets more common as you get older. There are many possible causes of edema. Eating too much salt (sodium) and being on your feet or sitting for a long time can cause edema in your legs, feet, and ankles. Hot weather may make edema worse. Edema is usually painless. Your skin may look swollen or shiny. Follow these instructions at home:  Keep the swollen body part raised (elevated) above the level of your heart when you are sitting or lying down.  Do not sit still or stand for a long time.  Do not wear tight clothes. Do not wear garters on your upper legs.  Exercise your legs. This can help the swelling go down.  Wear elastic bandages or support stockings as told by your doctor.  Eat a low-salt (low-sodium) diet to reduce fluid as told by your doctor.  Depending on the cause of your swelling, you may need to limit how much fluid you drink (fluid restriction).  Take over-the-counter and prescription medicines only as told by your doctor. Contact a doctor if:  Treatment is not working.  You have heart, liver, or kidney disease and have symptoms of edema.  You have sudden and unexplained weight gain. Get help right away if:  You have shortness of breath or chest pain.  You cannot breathe when you lie down.  You have pain, redness, or warmth in the swollen areas.  You have heart, liver, or kidney disease and get edema all of a sudden.  You have a fever and your symptoms get worse all of a sudden. Summary  Edema is when you have too much fluid in your body or under your skin.  Edema may make your legs, feet, and ankles swell up. Swelling is also common in looser tissues, like around your eyes.  Raise (elevate) the swollen body part above the level of your  heart when you are sitting or lying down.  Follow your doctor's instructions about diet and how much fluid you can drink (fluid restriction). This information is not intended to replace advice given to you by your health care provider. Make sure you discuss any questions you have with your health care provider. Document Revised: 04/14/2017 Document Reviewed: 04/29/2016 Elsevier Patient Education  2020 [employer]vier Inc[employer]  

## 2020-04-24 LAB — BRAIN NATRIURETIC PEPTIDE: BNP: 15.3 pg/mL (ref 0.0–100.0)

## 2020-04-24 LAB — BMP8+EGFR
BUN/Creatinine Ratio: 11 (ref 10–24)
BUN: 15 mg/dL (ref 8–27)
CO2: 26 mmol/L (ref 20–29)
Calcium: 9.6 mg/dL (ref 8.6–10.2)
Chloride: 97 mmol/L (ref 96–106)
Creatinine, Ser: 1.31 mg/dL — ABNORMAL HIGH (ref 0.76–1.27)
GFR calc Af Amer: 57 mL/min/{1.73_m2} — ABNORMAL LOW (ref >59)
GFR calc non Af Amer: 49 mL/min/{1.73_m2} — ABNORMAL LOW (ref >59)
Glucose: 93 mg/dL (ref 65–99)
Potassium: 3.5 mmol/L (ref 3.5–5.2)
Sodium: 137 mmol/L (ref 134–144)

## 2020-04-28 ENCOUNTER — Ambulatory Visit: Payer: Medicare Other

## 2020-04-28 DIAGNOSIS — I10 Essential (primary) hypertension: Secondary | ICD-10-CM

## 2020-04-28 DIAGNOSIS — F0391 Unspecified dementia with behavioral disturbance: Secondary | ICD-10-CM

## 2020-04-28 NOTE — Chronic Care Management (AMB) (Signed)
Chronic Care Management    Social Work Follow Up Note  04/28/2020 Name: Manuel [name] MRN: [id number] DOB: 1932-11-07  Manuel [name] is a 87 y.o. year old male who is a primary care patient of [name], MD. The CCM team was consulted for assistance with care coordination.   Review of patient status, including review of consultants reports, other relevant assessments, and collaboration with appropriate care team members and the patient's provider was performed as part of comprehensive patient evaluation and provision of chronic care management services.    SDOH (Social Determinants of Health) assessments performed: No    Outpatient Encounter Medications as of 04/28/2020  Medication Sig  . acetaminophen (TYLENOL) 500 MG tablet Take 1,000 mg by mouth every 6 (six) hours as needed for moderate pain.  Manuel [name] amLODipine (NORVASC) 5 MG tablet TAKE 1 TABLET BY MOUTH EVERY DAY  . Ascorbic Acid (VITAMIN C PO) Take 100 mg by mouth daily.  Manuel [name] aspirin EC 81 MG tablet Take 81 mg by mouth every evening.   . Calcium Carbonate Antacid (CALCIUM CARBONATE PO) Take 100 mg by mouth. 3 x weekly  . cholecalciferol (VITAMIN D3) 25 MCG (1000 UT) tablet Take 2,000 Units by mouth every evening.  . diphenhydrAMINE HCl, Sleep, (ZZZQUIL PO) Take 1 tablet by mouth at bedtime as needed.  Manuel [name] losartan-hydrochlorothiazide (HYZAAR) 100-25 MG tablet TAKE 1 TABLET BY MOUTH EVERY DAY  . memantine (NAMENDA) 10 MG tablet Take 1 tablet (10 mg total) by mouth 2 (two) times daily.  . QUEtiapine (SEROQUEL) 50 MG tablet Take 1 tablet (50 mg total) by mouth at bedtime.  . tamsulosin (FLOMAX) 0.4 MG CAPS capsule TAKE 1 CAPSULE BY MOUTH EVERY DAY  . vitamin E 200 UNIT capsule Take 200 Units by mouth every evening.   No facility-administered encounter medications on file as of 04/28/2020.     Patient Care Plan: Social Work Care Plan    Problem Identified: Care Coordination   Priority: Low    Goal: Achieve a normal sleep routine    Start Date: 04/28/2020  Expected End Date: 06/27/2020  Priority: High  Note:   Current Barriers:  . Progressive Dementia causing a change to patients sleep pattern . Lack of activity during the day to keep patient engaged . Limited effectiveness of current sleep medication . Chronic conditions including HTN, Dementia, and CKD III which put patient at increased risk of hospitalization  Social Work Clinical Goal(s):  Manuel [name] Over the next 60 days, patient will work with care team to address needs related to sleep routine . Over the next 20 days, patient will follow up with neurologist as directed by SW  Interventions: . 1:1 collaboration with [name], MD regarding development and update of comprehensive plan of care as evidenced by provider attestation and co-signature . Inter-disciplinary care team collaboration (see longitudinal plan of care) . Successful outbound call placed to the patients caregivers [name] and [name] to assess for care coordination needs . Determined the patient is not sleeping well even after recent increased of Seroquel from 25 mg to 50 mg QHS . Discussed the patients family would like another increase in medication and/or medication change to make the patient sleep through the night . Assessed for patient current routine - the patient gets ready for bed between 7-8pm each night. By 11pm he is up every hour saying "good morning". Once the patient eats breakfast the following day he goes to sleep for several hours . Discussed it is common  for someone with Dementia to get their days and nights mixed up . Provided education on the importance of engaging the patient during the day to help get sleep schedule back on track . Assessed for patient caregiver interest in enrolling patient in an adult day program - family declined stating it is too cold outside and they fear the patient would sleep the entire program since he is up all night . Re-iterated to the family  the importance of engaging the patient during the day to help him sleep at night - family member stated "we just need medicine strong enough to make him sleep" . Discussed medications do not always solve sleeping pattern concern . Performed chart review to note upcomming appointment scheduled with neurologist  . Advised the patients family caregivers to discuss sleep pattern concern at [place] appointment . Determined Manuel [name], the patients niece would like medications changed by Dr. Baird Cancer prior to neurology appointment . Collaboration with Dr. Baird Cancer to advise of above interventions and determine if she agrees with making medication changes as requested by the patients family  Patient Goals/Self-Care Activities Over the next 20 days, patient will:   - Patient will self administer medications as prescribed Patient will attend all scheduled provider appointments Patient will call provider office for new concerns or questions Contact SW as needed prior to next scheduled call  Follow up Plan: SW will follow up with patient by phone over the next month    Long-Range Goal: Collaborate with RN Care Manager to perform appropriate assessments to assist with care coordination needs   Start Date: 04/03/2020  Expected End Date: 07/02/2020  Recent Progress: On track  Priority: Low  Note:   Current Barriers:  . ADL IADL limitations . Memory Deficits . Chronic conditions including HTN, Dementia, and CKD III which put patient at increased risk of hospitaliation . Unable to self administer medications as prescribed  Social Work Clinical Goal(s):  Manuel [name] Over the next 30 days the patient will work with [place] [facility] as ordered by his primary care physician Goal not met due to staffing challenges . Over the next 120 days the patient will work with SW to address care coordination needs  CCM SW Interventions: . 1:1 collaboration with [name], MD regarding development and update of  comprehensive plan of care as evidenced by provider attestation and co-signature . Inter-disciplinary care team collaboration (see longitudinal plan of care) . Successful outbound call placed to the patients caregivers [name] and Rondell Reams to assess goal progression . Determined the patient has not started OT due to staffing challenges with local home health agencies . Discussed the family is no longer worried about the patient working with OT   Patient Goals/Self-Care Activities Over the next 30 days, patient will: With the help of his sister and niece  - Patient will self administer medications as prescribed Patient will attend all scheduled provider appointments Patient will call pharmacy for medication refills Patient will call provider office for new concerns or questions Contact SW as needed prior to next scheduled call  Follow up Plan: SW will follow up with patient by phone over the next month        Follow Up Plan: SW will follow up with patient by phone over the next month   [name], BSW, CDP Social Worker, Certified Dementia Practitioner [place] [facility] [place] [facility] [phone]  Total time spent performing care coordination and/or care management activities with the patient by phone or face to  face = 22 minutes.

## 2020-04-28 NOTE — Patient Instructions (Addendum)
Goals we discussed today:  Goals Addressed            This Visit's Progress   . Sleep through the night       Timeframe:  Short-Term Goal Priority:  High Start Date:    1.4.22                         Expected End Date: 3.5.22                       Next planned outreach: 2.4.22  Patient Goals/Self-Care Activities Over the next 20 days, patient will:   - Patient will self administer medications as prescribed Patient will attend all scheduled provider appointments Patient will call provider office for new concerns or questions Contact SW as needed prior to next scheduled call    . Work with SW to manage care coordination needs       Timeframe:  Long-Range Goal Priority:  Low Start Date:   12.10.21                          Expected End Date: 3.10.22                   Next Expected date of contact: 2.4.22  Patient Goals/Self-Care Activities Over the next 30 days, patient will: With the help of his sister and niece  - Patient will self administer medications as prescribed -Patient will attend all scheduled provider appointments -Patient will call pharmacy for medication refills -Patient will call provider office for new concerns or questions -Contact SW as needed prior to next scheduled call

## 2020-04-29 ENCOUNTER — Telehealth: Payer: Self-pay

## 2020-04-29 NOTE — Telephone Encounter (Signed)
[facility]urse [facility]/ [facility] Raynelle Fanning [phone] LVM  family stated you increased pt seroquel 50 mg however he is still not sleeping through the night, he has been sleeping during the day. The nurse and family is asking if his seroquel can be increased again, or additional medication to help him sleep at night

## 2020-05-01 ENCOUNTER — Inpatient Hospital Stay (HOSPITAL_COMMUNITY): Admission: RE | Admit: 2020-05-01 | Payer: Medicare Other | Source: Ambulatory Visit

## 2020-05-05 ENCOUNTER — Ambulatory Visit (HOSPITAL_COMMUNITY)
Admission: RE | Admit: 2020-05-05 | Payer: Medicare Other | Source: Ambulatory Visit | Attending: Nurse Practitioner | Admitting: Nurse Practitioner

## 2020-05-07 ENCOUNTER — Other Ambulatory Visit: Payer: Self-pay | Admitting: Nurse Practitioner

## 2020-05-07 ENCOUNTER — Telehealth: Payer: Self-pay | Admitting: Nurse Practitioner

## 2020-05-07 DIAGNOSIS — G8929 Other chronic pain: Secondary | ICD-10-CM

## 2020-05-07 DIAGNOSIS — N390 Urinary tract infection, site not specified: Secondary | ICD-10-CM | POA: Diagnosis not present

## 2020-05-07 DIAGNOSIS — R6889 Other general symptoms and signs: Secondary | ICD-10-CM | POA: Diagnosis not present

## 2020-05-07 DIAGNOSIS — E860 Dehydration: Secondary | ICD-10-CM | POA: Diagnosis not present

## 2020-05-07 DIAGNOSIS — M25551 Pain in right hip: Secondary | ICD-10-CM

## 2020-05-07 LAB — BASIC METABOLIC PANEL
BUN: 19 (ref 4–21)
CO2: 25 — AB (ref 13–22)
Chloride: 92 — AB (ref 99–108)
Creatinine: 1.2 (ref 0.6–1.3)
Glucose: 72
Potassium: 3.4 (ref 3.4–5.3)
Sodium: 129 — AB (ref 137–147)

## 2020-05-07 LAB — COMPREHENSIVE METABOLIC PANEL
Calcium: 9.4 (ref 8.7–10.7)
GFR calc Af Amer: 16
GFR calc non Af Amer: 64

## 2020-05-07 LAB — CBC AND DIFFERENTIAL
HCT: 39 — AB (ref 41–53)
Hemoglobin: 13.6 (ref 13.5–17.5)
Neutrophils Absolute: 3348
Platelets: 224 (ref 150–399)
WBC: 6.4

## 2020-05-07 LAB — HEPATIC FUNCTION PANEL
ALT: 16 (ref 10–40)
AST: 26 (ref 14–40)
Alkaline Phosphatase: 60 (ref 25–125)
Bilirubin, Total: 0.4

## 2020-05-07 LAB — CBC: RBC: 4.63 (ref 3.87–5.11)

## 2020-05-07 NOTE — Telephone Encounter (Signed)
Spoke with toni pickard (niece) who reports the patient has had a decline in his functional status and is unable to perform his ADLs as he was up to one week ago.  The [facility] palliative care nurse is there and will obtain labs to include BMP, CBC and urinalysis. I did explain to his niece this may be related to disease process decline. He is scheduled to see the neurologist next month and will have a venous doppler done nextweek.

## 2020-05-09 ENCOUNTER — Other Ambulatory Visit: Payer: Self-pay | Admitting: Diagnostic Neuroimaging

## 2020-05-09 ENCOUNTER — Other Ambulatory Visit: Payer: Self-pay | Admitting: Internal Medicine

## 2020-05-13 ENCOUNTER — Ambulatory Visit: Payer: Medicare Other | Admitting: Diagnostic Neuroimaging

## 2020-05-14 ENCOUNTER — Encounter: Payer: Self-pay | Admitting: Internal Medicine

## 2020-05-14 DIAGNOSIS — M6281 Muscle weakness (generalized): Secondary | ICD-10-CM | POA: Diagnosis not present

## 2020-05-14 DIAGNOSIS — R54 Age-related physical debility: Secondary | ICD-10-CM | POA: Diagnosis not present

## 2020-05-14 DIAGNOSIS — E7800 Pure hypercholesterolemia, unspecified: Secondary | ICD-10-CM | POA: Diagnosis not present

## 2020-05-14 DIAGNOSIS — I10 Essential (primary) hypertension: Secondary | ICD-10-CM | POA: Diagnosis not present

## 2020-05-14 LAB — COMPREHENSIVE METABOLIC PANEL
Albumin: 3.9 (ref 3.5–5.0)
Calcium: 9.6 (ref 8.7–10.7)
GFR calc Af Amer: 56
GFR calc non Af Amer: 49

## 2020-05-14 LAB — BASIC METABOLIC PANEL WITH GFR
BUN: 16 (ref 4–21)
CO2: 32 — AB (ref 13–22)
Chloride: 93 — AB (ref 99–108)
Creatinine: 1.1 (ref 0.6–1.3)
Glucose: 85
Potassium: 3.4 (ref 3.4–5.3)
Sodium: 131 — AB (ref 137–147)

## 2020-05-14 LAB — HEPATIC FUNCTION PANEL
ALT: 18 (ref 10–40)
AST: 24 (ref 14–40)
Alkaline Phosphatase: 67 (ref 25–125)
Bilirubin, Total: 0.3

## 2020-05-14 LAB — CBC AND DIFFERENTIAL
HCT: 38 — AB (ref 41–53)
Hemoglobin: 12.8 — AB (ref 13.5–17.5)
Neutrophils Absolute: 2.1
Platelets: 230 (ref 150–399)
WBC: 4.2

## 2020-05-14 LAB — CBC: RBC: 4.42 (ref 3.87–5.11)

## 2020-05-17 ENCOUNTER — Encounter: Payer: Self-pay | Admitting: Nurse Practitioner

## 2020-05-18 ENCOUNTER — Telehealth: Payer: Self-pay

## 2020-05-18 NOTE — Telephone Encounter (Signed)
Ms. Tanya Nones wanted to know if Dr Allyne Gee got updates from [facility] remote and she was told yes.  Ms. Tanya Nones said that thn remote changed the pt's bp med to plain losartan 100 mg without the hctz and that she needed information on if he was still having a doppler done.  She was told to give [facility] remote a call about the doppler appt.

## 2020-05-22 DIAGNOSIS — M47816 Spondylosis without myelopathy or radiculopathy, lumbar region: Secondary | ICD-10-CM | POA: Diagnosis not present

## 2020-05-22 DIAGNOSIS — Z043 Encounter for examination and observation following other accident: Secondary | ICD-10-CM | POA: Diagnosis not present

## 2020-05-25 DIAGNOSIS — R2681 Unsteadiness on feet: Secondary | ICD-10-CM | POA: Diagnosis not present

## 2020-05-25 DIAGNOSIS — I10 Essential (primary) hypertension: Secondary | ICD-10-CM | POA: Diagnosis not present

## 2020-05-25 DIAGNOSIS — M7989 Other specified soft tissue disorders: Secondary | ICD-10-CM | POA: Diagnosis not present

## 2020-05-25 DIAGNOSIS — R296 Repeated falls: Secondary | ICD-10-CM | POA: Diagnosis not present

## 2020-05-25 DIAGNOSIS — M810 Age-related osteoporosis without current pathological fracture: Secondary | ICD-10-CM | POA: Diagnosis not present

## 2020-05-25 DIAGNOSIS — E7800 Pure hypercholesterolemia, unspecified: Secondary | ICD-10-CM | POA: Diagnosis not present

## 2020-05-25 DIAGNOSIS — M6281 Muscle weakness (generalized): Secondary | ICD-10-CM | POA: Diagnosis not present

## 2020-05-26 NOTE — Progress Notes (Signed)
error 

## 2020-05-27 ENCOUNTER — Other Ambulatory Visit: Payer: Self-pay

## 2020-05-27 ENCOUNTER — Ambulatory Visit (INDEPENDENT_AMBULATORY_CARE_PROVIDER_SITE_OTHER): Payer: Medicare Other

## 2020-05-27 ENCOUNTER — Ambulatory Visit: Payer: Medicare Other | Admitting: Internal Medicine

## 2020-05-27 VITALS — BP 132/80 | HR 104 | Temp 97.5°F | Ht 67.0 in | Wt 189.6 lb

## 2020-05-27 DIAGNOSIS — Z23 Encounter for immunization: Secondary | ICD-10-CM

## 2020-05-27 DIAGNOSIS — Z0000 Encounter for general adult medical examination without abnormal findings: Secondary | ICD-10-CM | POA: Diagnosis not present

## 2020-05-27 NOTE — Patient Instructions (Signed)
Manuel [name] , Thank you for taking time to come for your Medicare Wellness Visit. I appreciate your ongoing commitment to your health goals. Please review the following plan we discussed and let me know if I can assist you in the future.   Screening recommendations/referrals: Colonoscopy: not required Recommended yearly ophthalmology/optometry visit for glaucoma screening and checkup Recommended yearly dental visit for hygiene and checkup  Vaccinations: Influenza vaccine: completed 01/09/2020, due 11/23/2020 Pneumococcal vaccine: today Tdap vaccine: completed 10/19/2015 Shingles vaccine: discussed   Covid-19:  03/06/2020, 07/17/2019, 06/21/2019  Advanced directives: copy in chart  Conditions/risks identified: none  Next appointment: Follow up in one year for your annual wellness visit.   Preventive Care 96 Years and Older, Male Preventive care refers to lifestyle choices and visits with your health care provider that can promote health and wellness. What does preventive care include?  A yearly physical exam. This is also called an annual well check.  Dental exams once or twice a year.  Routine eye exams. Ask your health care provider how often you should have your eyes checked.  Personal lifestyle choices, including:  Daily care of your teeth and gums.  Regular physical activity.  Eating a healthy diet.  Avoiding tobacco and drug use.  Limiting alcohol use.  Practicing safe sex.  Taking low doses of aspirin every day.  Taking vitamin and mineral supplements as recommended by your health care provider. What happens during an annual well check? The services and screenings done by your health care provider during your annual well check will depend on your age, overall health, lifestyle risk factors, and family history of disease. Counseling  Your health care provider may ask you questions about your:  Alcohol use.  Tobacco use.  Drug use.  Emotional  well-being.  Home and relationship well-being.  Sexual activity.  Eating habits.  History of falls.  Memory and ability to understand (cognition).  Work and work Astronomer. Screening  You may have the following tests or measurements:  Height, weight, and BMI.  Blood pressure.  Lipid and cholesterol levels. These may be checked every 5 years, or more frequently if you are over 39 years old.  Skin check.  Lung cancer screening. You may have this screening every year starting at age 9 if you have a 30-pack-year history of smoking and currently smoke or have quit within the past 15 years.  Fecal occult blood test (FOBT) of the stool. You may have this test every year starting at age 36.  Flexible sigmoidoscopy or colonoscopy. You may have a sigmoidoscopy every 5 years or a colonoscopy every 10 years starting at age 73.  Prostate cancer screening. Recommendations will vary depending on your family history and other risks.  Hepatitis C blood test.  Hepatitis B blood test.  Sexually transmitted disease (STD) testing.  Diabetes screening. This is done by checking your blood sugar (glucose) after you have not eaten for a while (fasting). You may have this done every 1-3 years.  Abdominal aortic aneurysm (AAA) screening. You may need this if you are a current or former smoker.  Osteoporosis. You may be screened starting at age 38 if you are at high risk. Talk with your health care provider about your test results, treatment options, and if necessary, the need for more tests. Vaccines  Your health care provider may recommend certain vaccines, such as:  Influenza vaccine. This is recommended every year.  Tetanus, diphtheria, and acellular pertussis (Tdap, Td) vaccine. You may need a Td  booster every 10 years.  Zoster vaccine. You may need this after age 80.  Pneumococcal 13-valent conjugate (PCV13) vaccine. One dose is recommended after age 18.  Pneumococcal  polysaccharide (PPSV23) vaccine. One dose is recommended after age 88. Talk to your health care provider about which screenings and vaccines you need and how often you need them. This information is not intended to replace advice given to you by your health care provider. Make sure you discuss any questions you have with your health care provider. Document Released: 05/08/2015 Document Revised: 12/30/2015 Document Reviewed: 02/10/2015 Elsevier Interactive Patient Education  2017 [place] Prevention in the Home Falls can cause injuries. They can happen to people of all ages. There are many things you can do to make your home safe and to help prevent falls. What can I do on the outside of my home?  Regularly fix the edges of walkways and driveways and fix any cracks.  Remove anything that might make you trip as you walk through a door, such as a raised step or threshold.  Trim any bushes or trees on the path to your home.  Use bright outdoor lighting.  Clear any walking paths of anything that might make someone trip, such as rocks or tools.  Regularly check to see if handrails are loose or broken. Make sure that both sides of any steps have handrails.  Any raised decks and porches should have guardrails on the edges.  Have any leaves, snow, or ice cleared regularly.  Use sand or salt on walking paths during winter.  Clean up any spills in your garage right away. This includes oil or grease spills. What can I do in the bathroom?  Use night lights.  Install grab bars by the toilet and in the tub and shower. Do not use towel bars as grab bars.  Use non-skid mats or decals in the tub or shower.  If you need to sit down in the shower, use a plastic, non-slip stool.  Keep the floor dry. Clean up any water that spills on the floor as soon as it happens.  Remove soap buildup in the tub or shower regularly.  Attach bath mats securely with double-sided non-slip rug  tape.  Do not have throw rugs and other things on the floor that can make you trip. What can I do in the bedroom?  Use night lights.  Make sure that you have a light by your bed that is easy to reach.  Do not use any sheets or blankets that are too big for your bed. They should not hang down onto the floor.  Have a firm chair that has side arms. You can use this for support while you get dressed.  Do not have throw rugs and other things on the floor that can make you trip. What can I do in the kitchen?  Clean up any spills right away.  Avoid walking on wet floors.  Keep items that you use a lot in easy-to-reach places.  If you need to reach something above you, use a strong step stool that has a grab bar.  Keep electrical cords out of the way.  Do not use floor polish or wax that makes floors slippery. If you must use wax, use non-skid floor wax.  Do not have throw rugs and other things on the floor that can make you trip. What can I do with my stairs?  Do not leave any items on the stairs.  Make sure that there are handrails on both sides of the stairs and use them. Fix handrails that are broken or loose. Make sure that handrails are as long as the stairways.  Check any carpeting to make sure that it is firmly attached to the stairs. Fix any carpet that is loose or worn.  Avoid having throw rugs at the top or bottom of the stairs. If you do have throw rugs, attach them to the floor with carpet tape.  Make sure that you have a light switch at the top of the stairs and the bottom of the stairs. If you do not have them, ask someone to add them for you. What else can I do to help prevent falls?  Wear shoes that:  Do not have high heels.  Have rubber bottoms.  Are comfortable and fit you well.  Are closed at the toe. Do not wear sandals.  If you use a stepladder:  Make sure that it is fully opened. Do not climb a closed stepladder.  Make sure that both sides of the  stepladder are locked into place.  Ask someone to hold it for you, if possible.  Clearly mark and make sure that you can see:  Any grab bars or handrails.  First and last steps.  Where the edge of each step is.  Use tools that help you move around (mobility aids) if they are needed. These include:  Canes.  Walkers.  Scooters.  Crutches.  Turn on the lights when you go into a dark area. Replace any light bulbs as soon as they burn out.  Set up your furniture so you have a clear path. Avoid moving your furniture around.  If any of your floors are uneven, fix them.  If there are any pets around you, be aware of where they are.  Review your medicines with your doctor. Some medicines can make you feel dizzy. This can increase your chance of falling. Ask your doctor what other things that you can do to help prevent falls. This information is not intended to replace advice given to you by your health care provider. Make sure you discuss any questions you have with your health care provider. Document Released: 02/05/2009 Document Revised: 09/17/2015 Document Reviewed: 05/16/2014 Elsevier Interactive Patient Education  2017 [employer].

## 2020-05-27 NOTE — Progress Notes (Signed)
Subjective:   Kadin E Sena is a 87 y.o. male who presents for Medicare Annual/Subsequent preventive examination.  Review of Systems     Cardiac Risk Factors include: advanced age (>12men, >28 women);hypertension;male gender;sedentary lifestyle     Objective:    Today's Vitals   05/27/20 1200  BP: 132/80  Pulse: (!) 104  Temp: (!) 97.5 F (36.4 C)  TempSrc: Oral  Weight: 189 lb 9.6 oz (86 kg)  Height: 5\' 7"  (1.702 m)   Body mass index is 29.7 kg/m.  Advanced Directives 05/27/2020 01/09/2020 12/26/2018 07/04/2018 06/24/2018 06/24/2018 12/11/2017  Does Patient Have a Medical Advance Directive? Yes Yes Yes No No No No  Type of Advance Directive Out of facility DNR (pink MOST or yellow form) Out of facility DNR (pink MOST or yellow form) Healthcare Power of [place];Living will - - - -  Copy of Healthcare Power of Attorney in Chart? - - No - copy requested - - - -  Would patient like information on creating a medical advance directive? - - - Yes (MAU/Ambulatory/Procedural Areas - Information given) No - Patient declined No - Patient declined Yes (MAU/Ambulatory/Procedural Areas - Information given)    Current Medications (verified) Outpatient Encounter Medications as of 05/27/2020  Medication Sig  . acetaminophen (TYLENOL) 500 MG tablet Take 1,000 mg by mouth every 6 (six) hours as needed for moderate pain.  07/25/2020 amLODipine (NORVASC) 5 MG tablet TAKE 1 TABLET BY MOUTH EVERY DAY  . Ascorbic Acid (VITAMIN C PO) Take 100 mg by mouth daily.  [name] aspirin EC 81 MG tablet Take 81 mg by mouth every evening.   . Calcium Carbonate Antacid (CALCIUM CARBONATE PO) Take 100 mg by mouth. 3 x weekly  . cholecalciferol (VITAMIN D3) 25 MCG (1000 UT) tablet Take 2,000 Units by mouth every evening.  . diphenhydrAMINE HCl, Sleep, (ZZZQUIL PO) Take 1 tablet by mouth at bedtime as needed.  [name] losartan (COZAAR) 100 MG tablet Take 100 mg by mouth daily. THN remote  . memantine (NAMENDA) 10 MG tablet Take 1 tablet  (10 mg total) by mouth 2 (two) times daily.  . QUEtiapine (SEROQUEL) 50 MG tablet Take 1 tablet (50 mg total) by mouth at bedtime.  . tamsulosin (FLOMAX) 0.4 MG CAPS capsule TAKE 1 CAPSULE BY MOUTH EVERY DAY  . traMADol (ULTRAM) 50 MG tablet Take 50 mg by mouth every 8 (eight) hours as needed.  . vitamin E 200 UNIT capsule Take 200 Units by mouth every evening.   No facility-administered encounter medications on file as of 05/27/2020.    Allergies (verified) Penicillins   History: Past Medical History:  Diagnosis Date  . Alzheimer's dementia (HCC)   . Hypertension   . Prostate disorder    Past Surgical History:  Procedure Laterality Date  . TOTAL HIP ARTHROPLASTY     Family History  Problem Relation Age of Onset  . Early death Mother   . Prostate cancer Father    Social History   Socioeconomic History  . Marital status: Single    Spouse name: Not on file  . Number of children: 0  . Years of education: Not on file  . Highest education level: 7th grade  Occupational History  . Occupation: retired    Comment: retiredd  Tobacco Use  . Smoking status: Former Smoker    Years: 2.00  . Smokeless tobacco: Never Used  Vaping Use  . Vaping Use: Never used  Substance and Sexual Activity  . Alcohol use: No  .  Drug use: No  . Sexual activity: Not Currently  Other Topics Concern  . Not on file  Social History Narrative   11/26/18 lives w/youngest sister and niece, Sheralyn Boatman    Social Determinants of Health   Financial Resource Strain: Low Risk   . Difficulty of Paying Living Expenses: Not hard at all  Food Insecurity: No Food Insecurity  . Worried About Programme researcher, broadcasting/film/video in the Last Year: Never true  . Ran Out of Food in the Last Year: Never true  Transportation Needs: No Transportation Needs  . Lack of Transportation (Medical): No  . Lack of Transportation (Non-Medical): No  Physical Activity: Inactive  . Days of Exercise per Week: 0 days  . Minutes of Exercise per  Session: 0 min  Stress: No Stress Concern Present  . Feeling of Stress : Not at all  Social Connections: Not on file    Tobacco Counseling Counseling given: Not Answered   Clinical Intake:  Pre-visit preparation completed: Yes  Pain : No/denies pain     Nutritional Status: BMI 25 -29 Overweight Nutritional Risks: None     Diabetic? no  Interpreter Needed?: No  Information entered by :: NAllen LPN   Activities of Daily Living In your present state of health, do you have any difficulty performing the following activities: 05/27/2020 01/09/2020  Hearing? N Y  Comment - a little bit  Vision? N Y  Comment - kind of blurry  Difficulty concentrating or making decisions? Malvin Johns  Walking or climbing stairs? Y N  Dressing or bathing? Y N  Doing errands, shopping? Malvin Johns  Preparing Food and eating ? Y Y  Using the Toilet? N N  In the past six months, have you accidently leaked urine? Malvin Johns  Comment wears depends wears depends  Do you have problems with loss of bowel control? N N  Managing your Medications? Y Y  Managing your Finances? Malvin Johns  Housekeeping or managing your Housekeeping? Malvin Johns  Some recent data might be hidden    Patient Care Team: Dorothyann Peng, MD as PCP - General (Internal Medicine) Clarene Duke, Karma Lew, RN as Case Manager Bevelyn Ngo as Social Worker  Indicate any recent Medical Services you may have received from other than [facility] providers in the past year (date may be approximate).     Assessment:   This is a routine wellness examination for Kert.  Hearing/Vision screen No exam data present  Dietary issues and exercise activities discussed: Current Exercise Habits: The patient does not participate in regular exercise at present  Goals    . "he needs to drink more water"     Sister stated CARE PLAN ENTRY (see longitudinal plan of care for additional care plan information)  Current Barriers:  [name] Knowledge deficit related to disease process and Self  Health management of CKD . Chronic Disease Management support and education needs related to Essential Hypertension, Dementia, Parenchymal renal hypertension, stage 1 through 4, CKD stage 3    Nurse Case Manager Clinical Goal(s):  [name] Over the next 90 days, patient will work with the CCM team and PCP to address needs related to disease education and support to help improve Self Health management of CKD   CCM RN CM Interventions:  12/12/19 call completed with patient's sister [name]  . Inter-disciplinary care team collaboration (see longitudinal plan of care) . Evaluation of current treatment plan related to CKD and patient's adherence to plan as established by provider. [name] Re-education to  sister/niece re: stages of Chronic disease; Educated on most recent GFR having improved from previous level's; Provided positive reinforcement provided for making efforts to help improve patient's renal function; Reinforced importance of having patient increase his water intake, and increase his activity as tolerated  . Discussed plans with patient for ongoing care management follow up and provided patient with direct contact information for care management team  Patient Self Care Activities:  . Attends all scheduled provider appointments . Calls pharmacy for medication refills . Calls provider office for new concerns or questions . Supportive family to assist with care needs   Please see past updates related to this goal by clicking on the "Past Updates" button in the selected goal      . "he's having difficulty chewing and swallowing his food"     Sister/niece stated  CARE PLAN ENTRY (see longitudinal plan of care for additional care plan information)  Current Barriers:  [name] Knowledge Deficits related to evaluation and treatment of dysphagia and risk for aspiration  . Chronic Disease Management support and education needs related to Essential Hypertension, Dementia, Parenchymal renal hypertension,  stage 1 through 4, CKD stage 3   . Cognitive Deficits  Nurse Case Manager Clinical Goal(s):  [name] Over the next 90 days, patient will work with the CCM team and PCP to address needs related to evaluation and treatment of dysphagia to help lower the risk for aspiration and malnutrition  Interventions:  . Inter-disciplinary care team collaboration (see longitudinal plan of care) . Evaluation of current treatment plan related to Impaired chewing and swallowing  and patient's adherence to plan as established by provider. . Provided education to patient re: potential risk for aspiration; Educated on recommendations for aspiration prevention; Educated on s/s suggestive of aspiration and to call the PCP and or CCM RN promptly to report symptoms or concerns  . Collaborated with PCP Dr. Dorothyann Peng via in basket message regarding reported s/s suggestive of dysphagia and difficulty chewing with new dentures, advised of aspiration concerns; advised patient did not complete ST previously ordered; Requested a new referral for ST be sent to [facility] per family's request . Discussed plans with patient for ongoing care management follow up and provided patient with direct contact information for care management team . Provided patient with printed educational materials related to Aspiration prevention   Patient Self Care Activities:  . Self administers medications as prescribed . Attends all scheduled provider appointments . Calls pharmacy for medication refills . Calls provider office for new concerns or questions . Supportive family to assist with care needs  Initial goal documentation     . Patient Stated     01/09/2020, niece states needs to sleep better at night    . Patient Stated     05/27/2020, keep him safe (per niece)    . Sleep through the night     Timeframe:  Short-Term Goal Priority:  High Start Date:    1.4.22                         Expected End Date: 3.5.22                        Next planned outreach: 2.4.22  Patient Goals/Self-Care Activities Over the next 20 days, patient will:   - Patient will self administer medications as prescribed Patient will attend all scheduled provider appointments Patient will call provider office for new concerns  or questions Contact SW as needed prior to next scheduled call    . Work with SW to manage care coordination needs     Timeframe:  Long-Range Goal Priority:  Low Start Date:   12.10.21                          Expected End Date: 3.10.22                   Next Expected date of contact: 2.4.22  Patient Goals/Self-Care Activities Over the next 30 days, patient will: With the help of his sister and niece  - Patient will self administer medications as prescribed -Patient will attend all scheduled provider appointments -Patient will call pharmacy for medication refills -Patient will call provider office for new concerns or questions -Contact SW as needed prior to next scheduled call      Depression Screen PHQ 2/9 Scores 05/27/2020 01/09/2020 06/25/2019 12/26/2018 06/29/2018 12/11/2017  PHQ - 2 Score 0 0 0 0 0 -  Exception Documentation - - - Other- indicate reason in comment box - Other- indicate reason in comment box  Not completed - - - just completed by CMA - call completed with sister    Fall Risk Fall Risk  05/27/2020 01/09/2020 06/25/2019 12/26/2018 12/26/2018  Falls in the past year? 1 0 0 0 0  Comment trying to get out of bed, knees gave out - - - -  Number falls in past yr: 1 - 0 - -  Injury with Fall? 0 - 0 - -  Risk for fall due to : Impaired balance/gait;Impaired mobility;History of fall(s);Medication side effect Impaired balance/gait;Medication side effect;Impaired mobility - Medication side effect;Impaired balance/gait -  Follow up Falls evaluation completed;Education provided;Falls prevention discussed Falls evaluation completed;Education provided;Falls prevention discussed - Falls evaluation completed;Education  provided;Falls prevention discussed -    FALL RISK PREVENTION PERTAINING TO THE HOME:  Any stairs in or around the home? No  If so, are there any without handrails? n/a Home free of loose throw rugs in walkways, pet beds, electrical cords, etc? Yes  Adequate lighting in your home to reduce risk of falls? Yes   ASSISTIVE DEVICES UTILIZED TO PREVENT FALLS:  Life alert? Yes  Use of a cane, walker or w/c? Yes  Grab bars in the bathroom? Yes  Shower chair or bench in shower? Yes  Elevated toilet seat or a handicapped toilet? Yes   TIMED UP AND GO:  Was the test performed? No .   Gait slow and steady with assistive device  Cognitive Function: MMSE - Mini Mental State Exam 11/26/2018  Orientation to time 1  Orientation to Place 4  Registration 3  Attention/ Calculation 0  Recall 0  Language- name 2 objects 2  Language- repeat 0  Language- follow 3 step command 3  Language- read & follow direction 1  Write a sentence 0  Copy design 0  Total score 14        Immunizations Immunization History  Administered Date(s) Administered  . Fluad Quad(high Dose 65+) 01/09/2020  . Influenza, High Dose Seasonal PF 03/01/2018, 12/26/2018  . PFIZER(Purple Top)SARS-COV-2 Vaccination 06/21/2019, 07/17/2019, 03/06/2020  . Pneumococcal Conjugate-13 10/19/2015  . Tdap 10/19/2015    TDAP status: Up to date  Flu Vaccine status: Up to date  Pneumococcal vaccine status: Completed during today's visit.  Covid-19 vaccine status: Completed vaccines  Qualifies for Shingles Vaccine? Yes   Zostavax completed No   Shingrix  Completed?: No.    Education has been provided regarding the importance of this vaccine. Patient has been advised to call insurance company to determine out of pocket expense if they have not yet received this vaccine. Advised may also receive vaccine at local pharmacy or Health Dept. Verbalized acceptance and understanding.  Screening Tests Health Maintenance  Topic Date  Due  . PNA vac Low Risk Adult (2 of 2 - PPSV23) 10/18/2016  . TETANUS/TDAP  10/18/2025  . INFLUENZA VACCINE  Completed  . COVID-19 Vaccine  Completed    Health Maintenance  Health Maintenance Due  Topic Date Due  . PNA vac Low Risk Adult (2 of 2 - PPSV23) 10/18/2016    Colorectal cancer screening: No longer required.   Lung Cancer Screening: (Low Dose CT Chest recommended if Age 56-80 years, 30 pack-year currently smoking OR have quit w/in 15years.) does not qualify.   Lung Cancer Screening Referral: no  Additional Screening:  Hepatitis C Screening: does not qualify;   Vision Screening: Recommended annual ophthalmology exams for early detection of glaucoma and other disorders of the eye. Is the patient up to date with their annual eye exam?  Yes  Who is the provider or what is the name of the office in which the patient attends annual eye exams? [facility] If pt is not established with a provider, would they like to be referred to a provider to establish care? No .   Dental Screening: Recommended annual dental exams for proper oral hygiene  Community Resource Referral / Chronic Care Management: CRR required this visit?  No   CCM required this visit?  No      Plan:     I have personally reviewed and noted the following in the patient's chart:   . Medical and social history . Use of alcohol, tobacco or illicit drugs  . Current medications and supplements . Functional ability and status . Nutritional status . Physical activity . Advanced directives . List of other physicians . Hospitalizations, surgeries, and ER visits in previous 12 months . Vitals . Screenings to include cognitive, depression, and falls . Referrals and appointments  In addition, I have reviewed and discussed with patient certain preventive protocols, quality metrics, and best practice recommendations. A written personalized care plan for preventive services as well as general preventive health  recommendations were provided to patient.     Barb Merino, LPN   4/0/0867   Nurse Notes: 6 CIT not administered. Patient has diagnosis of dementia and is followed by neurology.

## 2020-05-28 ENCOUNTER — Ambulatory Visit: Payer: Medicare Other

## 2020-05-28 DIAGNOSIS — I10 Essential (primary) hypertension: Secondary | ICD-10-CM | POA: Diagnosis not present

## 2020-05-28 DIAGNOSIS — M810 Age-related osteoporosis without current pathological fracture: Secondary | ICD-10-CM | POA: Diagnosis not present

## 2020-05-28 DIAGNOSIS — R296 Repeated falls: Secondary | ICD-10-CM | POA: Diagnosis not present

## 2020-05-28 DIAGNOSIS — M6281 Muscle weakness (generalized): Secondary | ICD-10-CM | POA: Diagnosis not present

## 2020-05-28 DIAGNOSIS — E7800 Pure hypercholesterolemia, unspecified: Secondary | ICD-10-CM | POA: Diagnosis not present

## 2020-05-28 DIAGNOSIS — R2681 Unsteadiness on feet: Secondary | ICD-10-CM | POA: Diagnosis not present

## 2020-05-29 ENCOUNTER — Other Ambulatory Visit: Payer: Self-pay | Admitting: Internal Medicine

## 2020-05-29 ENCOUNTER — Ambulatory Visit (INDEPENDENT_AMBULATORY_CARE_PROVIDER_SITE_OTHER): Payer: Medicare Other

## 2020-05-29 DIAGNOSIS — I10 Essential (primary) hypertension: Secondary | ICD-10-CM

## 2020-05-29 DIAGNOSIS — N183 Chronic kidney disease, stage 3 unspecified: Secondary | ICD-10-CM | POA: Diagnosis not present

## 2020-05-29 DIAGNOSIS — F0391 Unspecified dementia with behavioral disturbance: Secondary | ICD-10-CM

## 2020-05-29 NOTE — Chronic Care Management (AMB) (Signed)
Chronic Care Management    Social Work Note  05/29/2020 Name: Manuel [name] MRN: [id number] DOB: [birth year]  Manuel [name] is a 87 y.o. year old male who is a primary care patient of [name], MD. The CCM team was consulted to assist the patient with chronic disease management and/or care coordination needs related to: [employer] .   Engaged with patients niece/caregiver by telephone for follow up visit in response to provider referral for social work chronic care management and care coordination services.   Consent to Services:  The patient was given information about Chronic Care Management services, agreed to services, and gave verbal consent prior to initiation of services.  Please see initial visit note for detailed documentation.   Patient agreed to services and consent obtained.   Assessment: Review of patient past medical history, allergies, medications, and health status, including review of relevant consultants reports was performed today as part of a comprehensive evaluation and provision of chronic care management and care coordination services.     SDOH (Social Determinants of Health) assessments and interventions performed:    Advanced Directives Status: Not addressed in this encounter.  CCM Care Plan  Allergies  Allergen Reactions  . Penicillins Swelling    Did it involve swelling of the face/tongue/throat, SOB, or low BP? Yes Did it involve sudden or severe rash/hives, skin peeling, or any reaction on the inside of your mouth or nose? No Did you need to seek medical attention at a hospital or doctor's office? No When did it last happen?2013 If all above answers are "NO", may proceed with cephalosporin use.     Outpatient Encounter Medications as of 05/29/2020  Medication Sig  . acetaminophen (TYLENOL) 500 MG tablet Take 1,000 mg by mouth every 6 (six) hours as needed for moderate pain.  [name] amLODipine (NORVASC) 5 MG tablet TAKE 1 TABLET BY  MOUTH EVERY DAY  . Ascorbic Acid (VITAMIN C PO) Take 100 mg by mouth daily.  [name] aspirin EC 81 MG tablet Take 81 mg by mouth every evening.   . Calcium Carbonate Antacid (CALCIUM CARBONATE PO) Take 100 mg by mouth. 3 x weekly  . cholecalciferol (VITAMIN D3) 25 MCG (1000 UT) tablet Take 2,000 Units by mouth every evening.  . diphenhydrAMINE HCl, Sleep, (ZZZQUIL PO) Take 1 tablet by mouth at bedtime as needed.  [name] losartan (COZAAR) 100 MG tablet Take 100 mg by mouth daily. THN remote  . memantine (NAMENDA) 10 MG tablet Take 1 tablet (10 mg total) by mouth 2 (two) times daily.  . QUEtiapine (SEROQUEL) 50 MG tablet Take 1 tablet (50 mg total) by mouth at bedtime.  . tamsulosin (FLOMAX) 0.4 MG CAPS capsule TAKE 1 CAPSULE BY MOUTH EVERY DAY  . traMADol (ULTRAM) 50 MG tablet Take 50 mg by mouth every 8 (eight) hours as needed.  . vitamin E 200 UNIT capsule Take 200 Units by mouth every evening.   No facility-administered encounter medications on file as of 05/29/2020.    Patient Active Problem List   Diagnosis Date Noted  . Moderate dementia with behavioral disturbance ([place]) 11/26/2018  . FUO (fever of unknown origin) 06/24/2018  . Essential hypertension, benign 06/06/2018  . Other abnormal glucose 06/06/2018  . Pure hypercholesterolemia 06/06/2018    Conditions to be addressed/monitored: HTN, CKD Stage III and Dementia; ADL IADL limitations and Memory Deficits  Care Plan : Social Work Care Plan  Updates made by [name] since 05/29/2020 12:00 AM    Problem: Care  Coordination   Priority: Low    Long-Range Goal: Collaborate with RN Care Manager to perform appropriate assessments to assist with care coordination needs   Start Date: 04/03/2020  Expected End Date: 07/02/2020  This Visit's Progress: On track  Recent Progress: On track  Priority: Low  Note:   Current Barriers:  . ADL IADL limitations . Memory Deficits . Chronic conditions including HTN, Dementia, and CKD III which put  patient at increased risk of hospitaliation . Unable to self administer medications as prescribed  Social Work Clinical Goal(s):  [name] Over the next 30 days the patient will work with [facility] OT as ordered by his primary care physician Goal not met due to staffing challenges . Over the next 120 days the patient will work with SW to address care coordination needs  CCM SW Interventions: . 1:1 collaboration with [name], MD regarding development and update of comprehensive plan of care as evidenced by provider attestation and co-signature . Inter-disciplinary care team collaboration (see longitudinal plan of care) . Successful outbound call placed to the patients caregiver [name] to assess for care coordination needs . Discussed the patient is in need of a handheld shower as well as grab bars in the shower . Educated [name] on the Aging Gracefully program which is offered by [facility] declined referral stating her mother had received services from them in the past and they prefer to not work with them anymore . Reviewed the opportunity to order a handheld shower from patients over the counter catalog - [name] confirmed knowledge of this benefit and indicated she would log in to see how many credits the patient has left this quarter . Determined the patient is working with PT through [facility] and doing well . Discussed the patient has a shower chair but refuses to sit while in the shower even though that is the safest - family will continue to encourage the patient to remain seated while in the shower . Collaboration with RN Care Manager to inform of interventions and plan   Patient Goals/Self-Care Activities Over the next 60 days, patient will: With the help of his sister and niece  - Patient will self administer medications as prescribed Patient will attend all scheduled provider appointments Patient will call pharmacy for medication refills Patient will  call provider office for new concerns or questions Contact SW as needed prior to next scheduled call Order a handheld shower under OTC benefit Engage with PT  Follow up Plan: SW will follow up with patient by phone over the next 60 days       Follow Up Plan: SW will follow up with patient by phone over the next 60 days      [name], BSW, CDP Social Worker, Certified Dementia Practitioner [facility] [phone]  Total time spent performing care coordination and/or care management activities with the patient by phone or face to face = 26 minutes.

## 2020-05-29 NOTE — Patient Instructions (Signed)
Goals we discussed today:  Goals Addressed            This Visit's Progress   . Work with SW to manage care coordination needs   On track    Timeframe:  Long-Range Goal Priority:  Low Start Date:   12.10.21                          Expected End Date: 3.10.22                   Next Expected date of contact: 3.28.22  Patient Goals/Self-Care Activities Over the next 60 days, patient will: With the help of his sister and niece  - Patient will self administer medications as prescribed Patient will attend all scheduled provider appointments Patient will call pharmacy for medication refills Patient will call provider office for new concerns or questions Contact SW as needed prior to next scheduled call Order a handheld shower under OTC benefit Engage with PT

## 2020-06-01 DIAGNOSIS — R296 Repeated falls: Secondary | ICD-10-CM | POA: Diagnosis not present

## 2020-06-01 DIAGNOSIS — M810 Age-related osteoporosis without current pathological fracture: Secondary | ICD-10-CM | POA: Diagnosis not present

## 2020-06-01 DIAGNOSIS — M6281 Muscle weakness (generalized): Secondary | ICD-10-CM | POA: Diagnosis not present

## 2020-06-01 DIAGNOSIS — I10 Essential (primary) hypertension: Secondary | ICD-10-CM | POA: Diagnosis not present

## 2020-06-01 DIAGNOSIS — E7800 Pure hypercholesterolemia, unspecified: Secondary | ICD-10-CM | POA: Diagnosis not present

## 2020-06-01 DIAGNOSIS — R2681 Unsteadiness on feet: Secondary | ICD-10-CM | POA: Diagnosis not present

## 2020-06-02 DIAGNOSIS — E7800 Pure hypercholesterolemia, unspecified: Secondary | ICD-10-CM | POA: Diagnosis not present

## 2020-06-02 DIAGNOSIS — M810 Age-related osteoporosis without current pathological fracture: Secondary | ICD-10-CM | POA: Diagnosis not present

## 2020-06-02 DIAGNOSIS — I10 Essential (primary) hypertension: Secondary | ICD-10-CM | POA: Diagnosis not present

## 2020-06-02 DIAGNOSIS — M6281 Muscle weakness (generalized): Secondary | ICD-10-CM | POA: Diagnosis not present

## 2020-06-02 DIAGNOSIS — R296 Repeated falls: Secondary | ICD-10-CM | POA: Diagnosis not present

## 2020-06-02 DIAGNOSIS — R2681 Unsteadiness on feet: Secondary | ICD-10-CM | POA: Diagnosis not present

## 2020-06-04 ENCOUNTER — Telehealth: Payer: Self-pay

## 2020-06-04 NOTE — Telephone Encounter (Signed)
Mrs. Manuel [name] was notified that Dr Allyne Gee said the pt needed to add a little salt to his food and also increase his intake of potassium rich foods. List of potassium rich foods mailed to the pt.

## 2020-06-05 DIAGNOSIS — R2681 Unsteadiness on feet: Secondary | ICD-10-CM | POA: Diagnosis not present

## 2020-06-05 DIAGNOSIS — E7800 Pure hypercholesterolemia, unspecified: Secondary | ICD-10-CM | POA: Diagnosis not present

## 2020-06-05 DIAGNOSIS — M6281 Muscle weakness (generalized): Secondary | ICD-10-CM | POA: Diagnosis not present

## 2020-06-05 DIAGNOSIS — I10 Essential (primary) hypertension: Secondary | ICD-10-CM | POA: Diagnosis not present

## 2020-06-05 DIAGNOSIS — R296 Repeated falls: Secondary | ICD-10-CM | POA: Diagnosis not present

## 2020-06-05 DIAGNOSIS — M810 Age-related osteoporosis without current pathological fracture: Secondary | ICD-10-CM | POA: Diagnosis not present

## 2020-06-08 ENCOUNTER — Other Ambulatory Visit: Payer: Self-pay

## 2020-06-08 ENCOUNTER — Ambulatory Visit: Payer: Medicare Other | Admitting: Diagnostic Neuroimaging

## 2020-06-08 ENCOUNTER — Encounter: Payer: Self-pay | Admitting: *Deleted

## 2020-06-08 VITALS — BP 149/94 | HR 101 | Ht 67.0 in | Wt 189.0 lb

## 2020-06-08 DIAGNOSIS — F0391 Unspecified dementia with behavioral disturbance: Secondary | ICD-10-CM | POA: Diagnosis not present

## 2020-06-08 DIAGNOSIS — F03B18 Unspecified dementia, moderate, with other behavioral disturbance: Secondary | ICD-10-CM

## 2020-06-08 MED ORDER — MEMANTINE HCL 10 MG PO TABS: 10.0000 mg | ORAL_TABLET | Freq: Two times a day (BID) | ORAL | 4 refills | Status: DC

## 2020-06-08 NOTE — Progress Notes (Signed)
[facility]  PATIENT: Manuel [name] DOB: [birth year]  REFERRING CLINICIAN: Maggie Font HISTORY FROM: patient and niece REASON FOR VISIT: follow up    HISTORICAL  CHIEF COMPLAINT:  Chief Complaint  Patient presents with  . Dementia    Rm 7, niece/caregiver- Sheralyn Boatman, FU for medication management  MMSE 9    HISTORY OF PRESENT ILLNESS:   UPDATE (06/08/20, VRP): Since last visit, memory loss has continued. Living with family. Has good support with family at home. Symptoms are progressive. Severity is moderate-severe. No alleviating or aggravating factors. Tolerating meds. Has hospital bed at home. Now using walker.  PRIOR HPI: 87 year old male here for evaluation of memory loss and dementia.  Patient has gradual onset progressive short-term memory loss, confusion, decline in ADLs since 2015.  Patient was previously working as a Arboriculturist at [employer].  Apparently he was starting to have urinary incontinence at that time but did not have the insight and judgment to handle this correctly.  He was found urinating outside of the dumpsters in the back of the store.  Patient has continued and progressive loss of memory in ADLs.  He is able to maintain most of his own personal hygiene issues.  However he is unable to use a phone, shop, prepare food, take care of his medications, drive.  Mood is stable at times, but other times he has short temper and agitation.   REVIEW OF SYSTEMS: Full 14 system review of systems performed and negative with exception of: As per HPI.  ALLERGIES: Allergies  Allergen Reactions  . Penicillins Swelling    Did it involve swelling of the face/tongue/throat, SOB, or low BP? Yes Did it involve sudden or severe rash/hives, skin peeling, or any reaction on the inside of your mouth or nose? No Did you need to seek medical attention at a hospital or doctor's office? No When did it last happen?2013 If all above answers are "NO",  may proceed with cephalosporin use.     HOME MEDICATIONS: Outpatient Medications Prior to Visit  Medication Sig Dispense Refill  . acetaminophen (TYLENOL) 500 MG tablet Take 1,000 mg by mouth every 6 (six) hours as needed for moderate pain.    [name] amLODipine (NORVASC) 5 MG tablet TAKE 1 TABLET BY MOUTH EVERY DAY 90 tablet 3  . Ascorbic Acid (VITAMIN C PO) Take 100 mg by mouth daily.    [name] aspirin EC 81 MG tablet Take 81 mg by mouth every evening.     . Calcium Carbonate Antacid (CALCIUM CARBONATE PO) Take 100 mg by mouth. 3 x weekly    . cholecalciferol (VITAMIN D3) 25 MCG (1000 UT) tablet Take 2,000 Units by mouth every evening.    . diphenhydrAMINE HCl, Sleep, (ZZZQUIL PO) Take 1 tablet by mouth at bedtime as needed.    [name] losartan (COZAAR) 100 MG tablet Take 100 mg by mouth daily. [facility] remote    . memantine (NAMENDA) 10 MG tablet Take 1 tablet (10 mg total) by mouth 2 (two) times daily. 180 tablet 0  . QUEtiapine (SEROQUEL) 50 MG tablet Take 1 tablet (50 mg total) by mouth at bedtime. 90 tablet 1  . tamsulosin (FLOMAX) 0.4 MG CAPS capsule TAKE 1 CAPSULE BY MOUTH EVERY DAY 90 capsule 1  . traMADol (ULTRAM) 50 MG tablet Take 50 mg by mouth every 8 (eight) hours as needed.    . vitamin E 200 UNIT capsule Take 200 Units by mouth every evening.     No facility-administered  medications prior to visit.    PAST MEDICAL HISTORY: Past Medical History:  Diagnosis Date  . Alzheimer's dementia (HCC)   . Hypertension   . Prostate disorder     PAST SURGICAL HISTORY: Past Surgical History:  Procedure Laterality Date  . TOTAL HIP ARTHROPLASTY      FAMILY HISTORY: Family History  Problem Relation Age of Onset  . Early death Mother   . Prostate cancer Father     SOCIAL HISTORY: Social History   Socioeconomic History  . Marital status: Single    Spouse name: Not on file  . Number of children: 0  . Years of education: Not on file  . Highest education level: 7th grade  Occupational  History  . Occupation: retired    Comment: retired  Tobacco Use  . Smoking status: Former Smoker    Years: 2.00  . Smokeless tobacco: Never Used  Vaping Use  . Vaping Use: Never used  Substance and Sexual Activity  . Alcohol use: No  . Drug use: No  . Sexual activity: Not Currently  Other Topics Concern  . Not on file  Social History Narrative   11/26/18 lives w/youngest sister and niece, Sheralyn Boatman    Social Determinants of Health   Financial Resource Strain: Low Risk   . Difficulty of Paying Living Expenses: Not hard at all  Food Insecurity: No Food Insecurity  . Worried About Programme researcher, broadcasting/film/video in the Last Year: Never true  . Ran Out of Food in the Last Year: Never true  Transportation Needs: No Transportation Needs  . Lack of Transportation (Medical): No  . Lack of Transportation (Non-Medical): No  Physical Activity: Inactive  . Days of Exercise per Week: 0 days  . Minutes of Exercise per Session: 0 min  Stress: No Stress Concern Present  . Feeling of Stress : Not at all  Social Connections: Not on file  Intimate Partner Violence: Not on file     PHYSICAL EXAM  GENERAL EXAM/CONSTITUTIONAL: Vitals:  Vitals:   06/08/20 1423  BP: (!) 149/94  Pulse: (!) 101  Weight: 189 lb (85.7 kg)  Height: 5\' 7"  (1.702 m)   Body mass index is 29.6 kg/m. Wt Readings from Last 3 Encounters:  06/08/20 189 lb (85.7 kg)  05/27/20 189 lb 9.6 oz (86 kg)  04/23/20 197 lb 6.4 oz (89.5 kg)    Patient is in no distress; well developed, nourished and groomed; neck is supple  CARDIOVASCULAR:  Examination of carotid arteries is normal; no carotid bruits  Regular rate and rhythm, no murmurs  Examination of peripheral vascular system by observation and palpation is normal  EYES:  Ophthalmoscopic exam of optic discs and posterior segments is normal; no papilledema or hemorrhages No exam data present  MUSCULOSKELETAL:  Gait, strength, tone, movements noted in Neurologic exam  below  NEUROLOGIC: MENTAL STATUS:  MMSE - Mini Mental State Exam 06/08/2020 11/26/2018  Orientation to time 1 1  Orientation to Place 3 4  Registration 3 3  Attention/ Calculation 0 0  Recall 0 0  Language- name 2 objects 0 2  Language- repeat 0 0  Language- follow 3 step command 2 3  Language- follow 3 step command-comments used left hand -  Language- read & follow direction 0 1  Write a sentence 0 0  Write a sentence-comments unable -  Copy design 0 0  Total score 9 14    awake, alert, oriented to person  [facility] memory   DECR attention  and concentration  language fluent, comprehension intact, naming intact  fund of knowledge appropriate  CALM  CRANIAL NERVE:   2nd - no papilledema on fundoscopic exam  2nd, 3rd, 4th, 6th - pupils equal and reactive to light, visual fields full to confrontation, extraocular muscles intact, no nystagmus  5th - facial sensation symmetric  7th - facial strength symmetric  8th - hearing intact  9th - palate elevates symmetrically, uvula midline  11th - shoulder shrug symmetric  12th - tongue protrusion midline  MOTOR:   normal bulk and tone, full strength in the BUE, BLE  SENSORY:   normal and symmetric to light touch  COORDINATION:   finger-nose-finger, fine finger movements SLOW  REFLEXES:   deep tendon reflexes TRACE and symmetric  GAIT/STATION:   SEVERE KYPHOSIS; STOOPED OVER GAIT; USING SINGLE POINT CANE; UNSTEADY     DIAGNOSTIC DATA (LABS, IMAGING, TESTING) - I reviewed patient records, labs, notes, testing and imaging myself where available.  Lab Results  Component Value Date   WBC 4.2 05/14/2020   HGB 12.8 (A) 05/14/2020   HCT 38 (A) 05/14/2020   MCV 95.3 06/25/2018   PLT 230 05/14/2020      Component Value Date/Time   NA 131 (A) 05/14/2020 0000   K 3.4 05/14/2020 0000   CL 93 (A) 05/14/2020 0000   CO2 32 (A) 05/14/2020 0000   GLUCOSE 93 04/23/2020 1420   GLUCOSE 118 (H) 06/25/2018 0549   BUN  16 05/14/2020 0000   CREATININE 1.1 05/14/2020 0000   CREATININE 1.31 (H) 04/23/2020 1420   CALCIUM 9.6 05/14/2020 0000   PROT 7.2 01/09/2020 1559   ALBUMIN 3.9 05/14/2020 0000   ALBUMIN 4.0 01/09/2020 1559   AST 24 05/14/2020 0000   ALT 18 05/14/2020 0000   ALKPHOS 67 05/14/2020 0000   BILITOT 0.2 01/09/2020 1559   GFRNONAA 49 05/14/2020 0000   GFRAA 56 05/14/2020 0000   Lab Results  Component Value Date   CHOL 153 12/26/2018   HDL 47 12/26/2018   LDLCALC 82 12/26/2018   TRIG 134 12/26/2018   CHOLHDL 3.3 12/26/2018   Lab Results  Component Value Date   HGBA1C 5.1 06/06/2018   Lab Results  Component Value Date   VITAMINB12 363 06/06/2018   Lab Results  Component Value Date   TSH 2.300 06/25/2019    06/24/18 CT head [I reviewed images myself and agree with interpretation. -VRP]  - Chronic atrophic and ischemic changes without acute abnormality.    ASSESSMENT AND PLAN  87 y.o. year old male here with gradual onset progressive short-term memory loss, confusion, decline in ADLs since 2015, most consistent with neurodegenerative dementia.  Dx:  1. Moderate dementia with behavioral disturbance (HCC)      PLAN:  MODERATE-SEVERE DEMENTIA - continue memantine 10mg  twice a day (refilled today; future refills per PCP) - safety / supervision issues reviewed; has great support from family - continue brain healthy activities - fall precautions reviewed  Meds ordered this encounter  Medications  . memantine (NAMENDA) 10 MG tablet    Sig: Take 1 tablet (10 mg total) by mouth 2 (two) times daily.    Dispense:  180 tablet    Refill:  4   Return for return to PCP.    , MD 06/08/2020, 2:53 PM Certified in Neurology, Neurophysiology and Neuroimaging  [facility] [address], [address] [place], [place] [phone]

## 2020-06-08 NOTE — Patient Instructions (Signed)
MODERATE-SEVERE DEMENTIA - continue memantine 10mg  twice a day

## 2020-06-09 DIAGNOSIS — M810 Age-related osteoporosis without current pathological fracture: Secondary | ICD-10-CM | POA: Diagnosis not present

## 2020-06-09 DIAGNOSIS — R2681 Unsteadiness on feet: Secondary | ICD-10-CM | POA: Diagnosis not present

## 2020-06-09 DIAGNOSIS — R296 Repeated falls: Secondary | ICD-10-CM | POA: Diagnosis not present

## 2020-06-09 DIAGNOSIS — M6281 Muscle weakness (generalized): Secondary | ICD-10-CM | POA: Diagnosis not present

## 2020-06-09 DIAGNOSIS — I10 Essential (primary) hypertension: Secondary | ICD-10-CM | POA: Diagnosis not present

## 2020-06-09 DIAGNOSIS — E7800 Pure hypercholesterolemia, unspecified: Secondary | ICD-10-CM | POA: Diagnosis not present

## 2020-06-10 ENCOUNTER — Other Ambulatory Visit: Payer: Self-pay | Admitting: Internal Medicine

## 2020-06-11 DIAGNOSIS — I10 Essential (primary) hypertension: Secondary | ICD-10-CM | POA: Diagnosis not present

## 2020-06-11 DIAGNOSIS — R2681 Unsteadiness on feet: Secondary | ICD-10-CM | POA: Diagnosis not present

## 2020-06-11 DIAGNOSIS — M810 Age-related osteoporosis without current pathological fracture: Secondary | ICD-10-CM | POA: Diagnosis not present

## 2020-06-11 DIAGNOSIS — E7800 Pure hypercholesterolemia, unspecified: Secondary | ICD-10-CM | POA: Diagnosis not present

## 2020-06-11 DIAGNOSIS — M6281 Muscle weakness (generalized): Secondary | ICD-10-CM | POA: Diagnosis not present

## 2020-06-11 DIAGNOSIS — R296 Repeated falls: Secondary | ICD-10-CM | POA: Diagnosis not present

## 2020-06-12 DIAGNOSIS — R296 Repeated falls: Secondary | ICD-10-CM | POA: Diagnosis not present

## 2020-06-12 DIAGNOSIS — M810 Age-related osteoporosis without current pathological fracture: Secondary | ICD-10-CM | POA: Diagnosis not present

## 2020-06-12 DIAGNOSIS — R2681 Unsteadiness on feet: Secondary | ICD-10-CM | POA: Diagnosis not present

## 2020-06-12 DIAGNOSIS — I10 Essential (primary) hypertension: Secondary | ICD-10-CM | POA: Diagnosis not present

## 2020-06-12 DIAGNOSIS — E7800 Pure hypercholesterolemia, unspecified: Secondary | ICD-10-CM | POA: Diagnosis not present

## 2020-06-12 DIAGNOSIS — M6281 Muscle weakness (generalized): Secondary | ICD-10-CM | POA: Diagnosis not present

## 2020-06-14 DIAGNOSIS — M6281 Muscle weakness (generalized): Secondary | ICD-10-CM | POA: Diagnosis not present

## 2020-06-14 DIAGNOSIS — R54 Age-related physical debility: Secondary | ICD-10-CM | POA: Diagnosis not present

## 2020-06-17 DIAGNOSIS — R296 Repeated falls: Secondary | ICD-10-CM | POA: Diagnosis not present

## 2020-06-17 DIAGNOSIS — M810 Age-related osteoporosis without current pathological fracture: Secondary | ICD-10-CM | POA: Diagnosis not present

## 2020-06-17 DIAGNOSIS — E7800 Pure hypercholesterolemia, unspecified: Secondary | ICD-10-CM | POA: Diagnosis not present

## 2020-06-17 DIAGNOSIS — I10 Essential (primary) hypertension: Secondary | ICD-10-CM | POA: Diagnosis not present

## 2020-06-17 DIAGNOSIS — M6281 Muscle weakness (generalized): Secondary | ICD-10-CM | POA: Diagnosis not present

## 2020-06-17 DIAGNOSIS — R2681 Unsteadiness on feet: Secondary | ICD-10-CM | POA: Diagnosis not present

## 2020-06-18 DIAGNOSIS — M6281 Muscle weakness (generalized): Secondary | ICD-10-CM | POA: Diagnosis not present

## 2020-06-18 DIAGNOSIS — I10 Essential (primary) hypertension: Secondary | ICD-10-CM | POA: Diagnosis not present

## 2020-06-18 DIAGNOSIS — E7800 Pure hypercholesterolemia, unspecified: Secondary | ICD-10-CM | POA: Diagnosis not present

## 2020-06-18 DIAGNOSIS — R2681 Unsteadiness on feet: Secondary | ICD-10-CM | POA: Diagnosis not present

## 2020-06-18 DIAGNOSIS — R296 Repeated falls: Secondary | ICD-10-CM | POA: Diagnosis not present

## 2020-06-18 DIAGNOSIS — M810 Age-related osteoporosis without current pathological fracture: Secondary | ICD-10-CM | POA: Diagnosis not present

## 2020-06-19 DIAGNOSIS — E7800 Pure hypercholesterolemia, unspecified: Secondary | ICD-10-CM | POA: Diagnosis not present

## 2020-06-19 DIAGNOSIS — R2681 Unsteadiness on feet: Secondary | ICD-10-CM | POA: Diagnosis not present

## 2020-06-19 DIAGNOSIS — I10 Essential (primary) hypertension: Secondary | ICD-10-CM | POA: Diagnosis not present

## 2020-06-19 DIAGNOSIS — M810 Age-related osteoporosis without current pathological fracture: Secondary | ICD-10-CM | POA: Diagnosis not present

## 2020-06-19 DIAGNOSIS — R296 Repeated falls: Secondary | ICD-10-CM | POA: Diagnosis not present

## 2020-06-19 DIAGNOSIS — M6281 Muscle weakness (generalized): Secondary | ICD-10-CM | POA: Diagnosis not present

## 2020-06-22 ENCOUNTER — Telehealth: Payer: Self-pay

## 2020-06-22 DIAGNOSIS — M810 Age-related osteoporosis without current pathological fracture: Secondary | ICD-10-CM | POA: Diagnosis not present

## 2020-06-22 DIAGNOSIS — E7800 Pure hypercholesterolemia, unspecified: Secondary | ICD-10-CM | POA: Diagnosis not present

## 2020-06-22 DIAGNOSIS — R296 Repeated falls: Secondary | ICD-10-CM | POA: Diagnosis not present

## 2020-06-22 DIAGNOSIS — R2681 Unsteadiness on feet: Secondary | ICD-10-CM | POA: Diagnosis not present

## 2020-06-22 DIAGNOSIS — M6281 Muscle weakness (generalized): Secondary | ICD-10-CM | POA: Diagnosis not present

## 2020-06-22 DIAGNOSIS — I10 Essential (primary) hypertension: Secondary | ICD-10-CM | POA: Diagnosis not present

## 2020-06-22 NOTE — Telephone Encounter (Signed)
The pt's niece called and gave an update on the patient. The NP that comes out from  [facility] remote increased the seroquel to 100 mg and started the pt on ativan to help with the pt's behavior.

## 2020-06-22 NOTE — Telephone Encounter (Signed)
Is she pleased with the care she is getting from [facility] remote? Our patients have been quite pleased with their care. They are a huge help to me as well.

## 2020-06-23 DIAGNOSIS — R6889 Other general symptoms and signs: Secondary | ICD-10-CM | POA: Diagnosis not present

## 2020-06-23 DIAGNOSIS — E860 Dehydration: Secondary | ICD-10-CM | POA: Diagnosis not present

## 2020-06-23 DIAGNOSIS — I10 Essential (primary) hypertension: Secondary | ICD-10-CM | POA: Diagnosis not present

## 2020-06-23 NOTE — Telephone Encounter (Signed)
Manuel [name] the pt's niece said yes she is.

## 2020-06-24 DIAGNOSIS — R2681 Unsteadiness on feet: Secondary | ICD-10-CM | POA: Diagnosis not present

## 2020-06-24 DIAGNOSIS — E7800 Pure hypercholesterolemia, unspecified: Secondary | ICD-10-CM | POA: Diagnosis not present

## 2020-06-24 DIAGNOSIS — M810 Age-related osteoporosis without current pathological fracture: Secondary | ICD-10-CM | POA: Diagnosis not present

## 2020-06-24 DIAGNOSIS — M6281 Muscle weakness (generalized): Secondary | ICD-10-CM | POA: Diagnosis not present

## 2020-06-24 DIAGNOSIS — R296 Repeated falls: Secondary | ICD-10-CM | POA: Diagnosis not present

## 2020-06-24 DIAGNOSIS — I10 Essential (primary) hypertension: Secondary | ICD-10-CM | POA: Diagnosis not present

## 2020-06-30 ENCOUNTER — Telehealth: Payer: Medicare Other

## 2020-06-30 ENCOUNTER — Ambulatory Visit: Payer: Medicare Other | Admitting: Internal Medicine

## 2020-06-30 ENCOUNTER — Ambulatory Visit (INDEPENDENT_AMBULATORY_CARE_PROVIDER_SITE_OTHER): Payer: Medicare Other

## 2020-06-30 DIAGNOSIS — I10 Essential (primary) hypertension: Secondary | ICD-10-CM

## 2020-06-30 DIAGNOSIS — N183 Chronic kidney disease, stage 3 unspecified: Secondary | ICD-10-CM

## 2020-06-30 DIAGNOSIS — F0391 Unspecified dementia with behavioral disturbance: Secondary | ICD-10-CM

## 2020-07-01 DIAGNOSIS — R296 Repeated falls: Secondary | ICD-10-CM | POA: Diagnosis not present

## 2020-07-01 DIAGNOSIS — M6281 Muscle weakness (generalized): Secondary | ICD-10-CM | POA: Diagnosis not present

## 2020-07-01 DIAGNOSIS — I10 Essential (primary) hypertension: Secondary | ICD-10-CM | POA: Diagnosis not present

## 2020-07-01 DIAGNOSIS — M810 Age-related osteoporosis without current pathological fracture: Secondary | ICD-10-CM | POA: Diagnosis not present

## 2020-07-01 DIAGNOSIS — R2681 Unsteadiness on feet: Secondary | ICD-10-CM | POA: Diagnosis not present

## 2020-07-01 DIAGNOSIS — E7800 Pure hypercholesterolemia, unspecified: Secondary | ICD-10-CM | POA: Diagnosis not present

## 2020-07-01 NOTE — Chronic Care Management (AMB) (Signed)
Chronic Care Management   CCM RN Visit Note  06/30/2020 Name: Manuel [name] MRN: [id number] DOB: November 23, 1932  Subjective: Manuel [name] is a 87 y.o. year old male who is a primary care patient of Dorothyann Peng, MD. The care management team was consulted for assistance with disease management and care coordination needs.    Engaged with patient by telephone for follow up visit in response to provider referral for case management and/or care coordination services.   Consent to Services:  The patient was given information about Chronic Care Management services, agreed to services, and gave verbal consent prior to initiation of services.  Please see initial visit note for detailed documentation.   Patient agreed to services and verbal consent obtained.   Assessment: Review of patient past medical history, allergies, medications, health status, including review of consultants reports, laboratory and other test data, was performed as part of comprehensive evaluation and provision of chronic care management services.   SDOH (Social Determinants of Health) assessments and interventions performed:  Yes  CCM Care Plan  Allergies  Allergen Reactions  . Penicillins Swelling    Did it involve swelling of the face/tongue/throat, SOB, or low BP? Yes Did it involve sudden or severe rash/hives, skin peeling, or any reaction on the inside of your mouth or nose? No Did you need to seek medical attention at a hospital or doctor's office? No When did it last happen?2013 If all above answers are "NO", may proceed with cephalosporin use.     Outpatient Encounter Medications as of 06/30/2020  Medication Sig  . acetaminophen (TYLENOL) 500 MG tablet Take 1,000 mg by mouth every 6 (six) hours as needed for moderate pain.  [name] amLODipine (NORVASC) 5 MG tablet TAKE 1 TABLET BY MOUTH EVERY DAY  . Ascorbic Acid (VITAMIN C PO) Take 100 mg by mouth daily.  [name] aspirin EC 81 MG tablet Take 81 mg by mouth  every evening.   . Calcium Carbonate Antacid (CALCIUM CARBONATE PO) Take 100 mg by mouth. 3 x weekly  . cholecalciferol (VITAMIN D3) 25 MCG (1000 UT) tablet Take 2,000 Units by mouth every evening.  . diphenhydrAMINE HCl, Sleep, (ZZZQUIL PO) Take 1 tablet by mouth at bedtime as needed.  [name] LORazepam (ATIVAN PO) Take by mouth. [facility] remote  . losartan (COZAAR) 100 MG tablet Take 100 mg by mouth daily. [facility] remote  . memantine (NAMENDA) 10 MG tablet Take 1 tablet (10 mg total) by mouth 2 (two) times daily.  . QUEtiapine (SEROQUEL) 100 MG tablet Take 100 mg by mouth at bedtime. [facility] Remote  . tamsulosin (FLOMAX) 0.4 MG CAPS capsule TAKE 1 CAPSULE BY MOUTH EVERY DAY  . traMADol (ULTRAM) 50 MG tablet Take 50 mg by mouth every 8 (eight) hours as needed.  . vitamin E 200 UNIT capsule Take 200 Units by mouth every evening.   No facility-administered encounter medications on file as of 06/30/2020.    Patient Active Problem List   Diagnosis Date Noted  . Moderate dementia with behavioral disturbance (HCC) 11/26/2018  . FUO (fever of unknown origin) 06/24/2018  . Essential hypertension, benign 06/06/2018  . Other abnormal glucose 06/06/2018  . Pure hypercholesterolemia 06/06/2018    Conditions to be addressed/monitored:Essential Hypertension, Dementia, Parenchymal renal hypertension, stage 1 through 4, CKD stage 3    Care Plan : Fall Risk (Adult)  Updates made by Riley Churches, RN since 07/01/2020 12:00 AM    Problem: Fall Risk   Priority: [employer]  Goal: Absence of Fall and Fall-Related Injury   Start Date: 06/30/2020  Expected End Date: 12/31/2020  This Visit's Progress: On track  Priority: High  Note:   Current Barriers:  [name] Knowledge Deficits related to fall precautions in patient with Dementia . Decreased adherence to prescribed treatment for fall prevention . Unable to self administer medications as prescribed . Unable to perform ADLs independently . Unable to perform IADLs  independently . Chronic Disease Management support and education needs related to Essential Hypertension, Dementia, Parenchymal renal hypertension, stage 1 through 4, CKD stage 3   . Cognitive Deficits Clinical Goal(s):  [name] Patient/caregiver will demonstrate improved adherence to prescribed treatment plan for decreasing falls as evidenced by patient reporting and review of EMR . Patient/caregiver will verbalize using fall risk reduction strategies discussed . Patient/caregiver will not experience additional falls . Patient/caregiver will verbalize understanding of plan for fall prevention plan Interventions:  06/30/20 successful call completed with sister [employer] . Collaboration with Dorothyann Peng, MD regarding development and update of comprehensive plan of care as evidenced by provider attestation and co-signature . Inter-disciplinary care team collaboration (see longitudinal plan of care) . Provided written and verbal education re: Potential causes of falls and Fall prevention strategies . Reviewed medications and discussed potential side effects of medications such as dizziness and frequent urination . Assessed for s/s of orthostatic hypotension . Assessed for falls since last encounter. . Assessed patients knowledge of fall risk prevention secondary to previously provided education. . Assessed working status of life alert bracelet and patient adherence . Evaluation of current treatment plan related to abnormal gait and impaired physical mobility and patient's adherence to plan as established by provider. . Provided education to patient re: fall prevention tips . Provided patient with printed educational materials related to Fall prevention  . Discussed plans with patient for ongoing care management follow up and provided patient with direct contact information for care management team Patient Goals/Self-Care Activities:  - Utilize rolling walker and rollator (assistive device)  appropriately with all ambulation - De-clutter walkways - Change positions slowly - Wear secure fitting shoes at all times with ambulation - Utilize home lighting for dim lit areas - Demonstrate self and pet awareness at all times Follow Up Plan: Telephone follow up appointment with care management team member scheduled for: 07/14/20    Plan:Telephone follow up appointment with care management team member scheduled for:  07/14/20  Delsa Sale, RN, BSN, CCM Care Management Coordinator [facility] Care Management/[facility]  Direct Phone: [phone]

## 2020-07-01 NOTE — Patient Instructions (Signed)
Goals Addressed    . Injury related to Fall minimized or prevented   On track    Timeframe:  Long-Range Goal Priority:  High Start Date:  06/30/20                           Expected End Date: 12/31/20  Next Follow Up Date: 07/13/20  - Utilize rolling walker and rollator (assistive device) appropriately with all ambulation - De-clutter walkways - Change positions slowly - Wear secure fitting shoes at all times with ambulation - Utilize home lighting for dim lit areas - Demonstrate self and pet awareness at all times

## 2020-07-03 DIAGNOSIS — M6281 Muscle weakness (generalized): Secondary | ICD-10-CM | POA: Diagnosis not present

## 2020-07-03 DIAGNOSIS — M810 Age-related osteoporosis without current pathological fracture: Secondary | ICD-10-CM | POA: Diagnosis not present

## 2020-07-03 DIAGNOSIS — E7800 Pure hypercholesterolemia, unspecified: Secondary | ICD-10-CM | POA: Diagnosis not present

## 2020-07-03 DIAGNOSIS — R2681 Unsteadiness on feet: Secondary | ICD-10-CM | POA: Diagnosis not present

## 2020-07-03 DIAGNOSIS — I10 Essential (primary) hypertension: Secondary | ICD-10-CM | POA: Diagnosis not present

## 2020-07-03 DIAGNOSIS — R296 Repeated falls: Secondary | ICD-10-CM | POA: Diagnosis not present

## 2020-07-07 ENCOUNTER — Ambulatory Visit: Payer: Self-pay

## 2020-07-07 ENCOUNTER — Telehealth: Payer: Medicare Other

## 2020-07-07 DIAGNOSIS — F0391 Unspecified dementia with behavioral disturbance: Secondary | ICD-10-CM

## 2020-07-07 DIAGNOSIS — N183 Chronic kidney disease, stage 3 unspecified: Secondary | ICD-10-CM | POA: Diagnosis not present

## 2020-07-07 DIAGNOSIS — I10 Essential (primary) hypertension: Secondary | ICD-10-CM

## 2020-07-07 DIAGNOSIS — I129 Hypertensive chronic kidney disease with stage 1 through stage 4 chronic kidney disease, or unspecified chronic kidney disease: Secondary | ICD-10-CM

## 2020-07-08 DIAGNOSIS — R296 Repeated falls: Secondary | ICD-10-CM | POA: Diagnosis not present

## 2020-07-08 DIAGNOSIS — I10 Essential (primary) hypertension: Secondary | ICD-10-CM | POA: Diagnosis not present

## 2020-07-08 DIAGNOSIS — E7800 Pure hypercholesterolemia, unspecified: Secondary | ICD-10-CM | POA: Diagnosis not present

## 2020-07-08 DIAGNOSIS — M6281 Muscle weakness (generalized): Secondary | ICD-10-CM | POA: Diagnosis not present

## 2020-07-08 DIAGNOSIS — M810 Age-related osteoporosis without current pathological fracture: Secondary | ICD-10-CM | POA: Diagnosis not present

## 2020-07-08 DIAGNOSIS — R2681 Unsteadiness on feet: Secondary | ICD-10-CM | POA: Diagnosis not present

## 2020-07-08 NOTE — Chronic Care Management (AMB) (Signed)
Chronic Care Management   CCM RN Visit Note  07/07/2020 Name: Manuel [name] MRN: [id number] DOB: 10/01/1932  Subjective: Manuel [name] is a 87 y.o. year old male who is a primary care patient of Dorothyann Peng, MD. The care management team was consulted for assistance with disease management and care coordination needs.    Engaged with patient by telephone for follow up visit in response to provider referral for case management and/or care coordination services.   Consent to Services:  The patient was given information about Chronic Care Management services, agreed to services, and gave verbal consent prior to initiation of services.  Please see initial visit note for detailed documentation.   Patient agreed to services and verbal consent obtained.   Assessment: Review of patient past medical history, allergies, medications, health status, including review of consultants reports, laboratory and other test data, was performed as part of comprehensive evaluation and provision of chronic care management services.   SDOH (Social Determinants of Health) assessments and interventions performed:    CCM Care Plan  Allergies  Allergen Reactions  . Penicillins Swelling    Did it involve swelling of the face/tongue/throat, SOB, or low BP? Yes Did it involve sudden or severe rash/hives, skin peeling, or any reaction on the inside of your mouth or nose? No Did you need to seek medical attention at a hospital or doctor's office? No When did it last happen?2013 If all above answers are "NO", may proceed with cephalosporin use.     Outpatient Encounter Medications as of 07/07/2020  Medication Sig  . acetaminophen (TYLENOL) 500 MG tablet Take 1,000 mg by mouth every 6 (six) hours as needed for moderate pain.  [name] amLODipine (NORVASC) 5 MG tablet TAKE 1 TABLET BY MOUTH EVERY DAY  . Ascorbic Acid (VITAMIN C PO) Take 100 mg by mouth daily.  [name] aspirin EC 81 MG tablet Take 81 mg by mouth  every evening.   . Calcium Carbonate Antacid (CALCIUM CARBONATE PO) Take 100 mg by mouth. 3 x weekly  . cholecalciferol (VITAMIN D3) 25 MCG (1000 UT) tablet Take 2,000 Units by mouth every evening.  . diphenhydrAMINE HCl, Sleep, (ZZZQUIL PO) Take 1 tablet by mouth at bedtime as needed.  [name] LORazepam (ATIVAN PO) Take by mouth. THN remote  . losartan (COZAAR) 100 MG tablet Take 100 mg by mouth daily. THN remote  . memantine (NAMENDA) 10 MG tablet Take 1 tablet (10 mg total) by mouth 2 (two) times daily.  . QUEtiapine (SEROQUEL) 100 MG tablet Take 100 mg by mouth at bedtime. THN Remote  . tamsulosin (FLOMAX) 0.4 MG CAPS capsule TAKE 1 CAPSULE BY MOUTH EVERY DAY  . traMADol (ULTRAM) 50 MG tablet Take 50 mg by mouth every 8 (eight) hours as needed.  . vitamin E 200 UNIT capsule Take 200 Units by mouth every evening.   No facility-administered encounter medications on file as of 07/07/2020.    Patient Active Problem List   Diagnosis Date Noted  . Moderate dementia with behavioral disturbance (HCC) 11/26/2018  . FUO (fever of unknown origin) 06/24/2018  . Essential hypertension, benign 06/06/2018  . Other abnormal glucose 06/06/2018  . Pure hypercholesterolemia 06/06/2018    Conditions to be addressed/monitored:Essential Hypertension, Dementia, Parenchymal renal hypertension, stage 1 through 4, CKD stage 3    Care Plan : Dementia (Adult)  Updates made by Riley Churches, RN since 07/08/2020 12:00 AM    Problem: Caregiver Stress   Priority: High    Long-Range Goal:  Caregiver Coping Optimized   Start Date: 07/07/2020  Expected End Date: 12/07/2020  This Visit's Progress: Not on track  Priority: High  Note:   Current Barriers:   Ineffective Self Health Maintenance  Unable to self administer medications as prescribed  Unable to perform ADLs independently  Unable to perform IADLs independently Clinical Goal(s):  [name] Collaboration with Dorothyann Peng, MD regarding development and update  of comprehensive plan of care as evidenced by provider attestation and co-signature . Inter-disciplinary care team collaboration (see longitudinal plan of care)  patient will work with care management team to address care coordination and chronic disease management needs related to Disease Management  Educational Needs  Care Coordination  Medication Management and Education  Medication Reconciliation  Psychosocial Support  Dementia and Caregiver Support   Interventions:   Evaluation of current treatment plan related to Dementia and Caregiver Stress, and patient's adherence to plan as established by provider.  Collaboration with Dorothyann Peng, MD regarding development and update of comprehensive plan of care as evidenced by provider attestation       and co-signature  Inter-disciplinary care team collaboration (see longitudinal plan of care)  Determined patient is beginning to wander during the nighttime within his home, although, the family has an alarm system in place and are taking shifts so to supervise patient while he is awake during the nighttime  Recognized and validated the complex nature of dementia, as well as the impact on the caregiver and extended family; provide education and support to align with needs and stage of dementia.   - active listening utilized  - caregiver stress acknowledged  - consideration of in-home help encouraged  - decision-making supported  - engagement with primary care provider encouraged  - healthy lifestyle promoted  - positive reinforcement provided  - self-reflection promoted  - social and community activities encouraged  Sent secure message to PCP Dr. 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 Discussed caregiver concerns with PCP and embedded BSW at next scheduled visits as discussed . Establish a long term plan of care after speaking with the patient's healthcare team and consider all options to help improve patient's quality of life and decrease caregiver stress . Continue to contact the CCM team and or PCP for questions or concerns  Patient Goals: - to keep him safe at home and or consider having him placed in a skilled nursing facility   Follow Up Plan: Telephone follow up appointment with care management team member scheduled for: 07/14/20        Task: Recognize and Manage Caregiver Stress Completed 07/08/2020  Note:   Care Management Activities:    - active listening utilized - caregiver stress acknowledged - consideration of in-home help encouraged - decision-making supported - engagement with primary care provider encouraged - healthy lifestyle promoted - positive reinforcement provided - self-reflection promoted - social and community activities encouraged    Notes:    Problem: Harm or Injury   Priority: High    Long-Range Goal: Harm or Injury Prevented   Start Date: 07/07/2020  Expected End Date: 12/07/2020  This  Visit's Progress: Not on track  Priority: High  Note:   Current Barriers:   Ineffective Self Health Maintenance  Unable to self administer medications as prescribed  Lacks social connections  Unable to perform ADLs independently  Unable to perform IADLs independently Clinical Goal(s):  [name] Collaboration with Dorothyann Peng, MD regarding development and update of comprehensive plan of care as evidenced by provider attestation and co-signature . Inter-disciplinary care team collaboration (see longitudinal plan of care)  patient  will work with care management team to address care coordination and chronic disease management needs related to Disease Management  Educational Needs  Care Coordination  Medication Management and Education  Psychosocial Support  Dementia and Caregiver Support   Interventions:   Evaluation of current treatment plan related to Dementia and patient's adherence to plan as established by provider.  Collaboration with Dorothyann Peng, MD regarding development and update of comprehensive plan of care as evidenced by provider attestation       and co-signature  Inter-disciplinary care team collaboration (see longitudinal plan of care)  Encouraged environmental modifications as functional and perceptual abilities change.   Consider referral to physical therapy for muscle strengthening and balance retraining program. (patient has completed)  Assessed ability to take, or for family/caregiver to give medications correctly and safely.   Provided anticipatory guidance about potential for wandering and falls; encourage home-monitoring device, medical alert jewelry and emergency call system.   - assistive or adaptive device use encouraged  - balance, gait and fall risk reviewed  - barriers to safety identified  - knowledge of how to summon emergency services ensured  - medication side effects managed  - modification of home and work environment promoted  - safety plan developed  Reviewed and discussed next PCP OV scheduled with Dr. Allyne Gee this week on 07/09/20 @2 :15 pm; Embedded BSW follow up call scheduled for 07/10/20    Sent secure message to PCP Dr. 07/12/20 and embedded BSW Allyne Gee regarding family's report of patient's worsening dementia and their safety concerns due to patient having an increase in falls; advised the family would like assistance with possible placement of patient into a SNF   Discussed plans with patient for ongoing care management follow up and provided  patient with direct contact information for care management team Self Care Activities:  . Discuss home safety concerns and worsening dementia with PCP at next visit . Collaborate with embedded BSW for Dementia related resources and Care Coordination needs . Call the CCM team and or PCP for questions or concerns . Keep all scheduled MD follow up appointments  Patient Goals: - to keep him safe at home   Next Follow Up Date: 07/14/20             Task: Identify and Reduce Risks for Harm or Injury Completed 07/08/2020  Note:   Care Management Activities:    - assistive or adaptive device use encouraged - balance, gait and fall risk reviewed - barriers to safety identified - knowledge of how to summon emergency services ensured - medication side effects managed - modification of home and work environment promoted - safety plan developed    Notes:     Plan:Telephone follow up appointment with care management team member scheduled for:  07/14/20  07/16/20, RN, BSN, CCM Care Management Coordinator [facility]  Direct Phone: [phone]

## 2020-07-08 NOTE — Patient Instructions (Signed)
Goals Addressed    . Caregiver Coping Optimized   On track    Timeframe:  Long-Range Goal Priority:  High Start Date: 07/07/20                            Expected End Date: 01/07/21   Next Follow Up date: 07/14/20  Patient Goals/Self-Care Activities:   . Discussed caregiver concerns with PCP and embedded BSW at next scheduled visits as discussed . Establish a long term plan of care after speaking with the patient's healthcare team and consider all options to help improve patient's quality of life and decrease caregiver stress . Continue to contact the CCM team and or PCP for questions or concerns                          . Dementia - Harm or Injury prevented   On track    Timeframe:  Long-Range Goal Priority:  High Start Date: 07/07/20                            Expected End Date: 12/07/20  Next follow up date: 07/14/20  Patient Goals/Self-Care Activities:     . Discuss home safety concerns and worsening dementia with PCP at next visit . Collaborate with embedded BSW for Dementia related resources and Care Coordination needs . Call the CCM team and or PCP for questions or concerns . Keep all scheduled MD follow up appointments                      . Injury related to Fall minimized or prevented       Timeframe:  Long-Range Goal Priority:  High Start Date:  06/30/20                           Expected End Date: 12/31/20  Next Follow Up Date: 07/14/20  - Utilize rolling walker and rollator (assistive device) appropriately with all ambulation - De-clutter walkways - Change positions slowly - Wear secure fitting shoes at all times with ambulation - Utilize home lighting for dim lit areas - Demonstrate self and pet awareness at all times

## 2020-07-09 ENCOUNTER — Encounter: Payer: Self-pay | Admitting: Internal Medicine

## 2020-07-09 ENCOUNTER — Other Ambulatory Visit: Payer: Self-pay

## 2020-07-09 ENCOUNTER — Ambulatory Visit: Payer: Medicare Other

## 2020-07-09 ENCOUNTER — Ambulatory Visit (INDEPENDENT_AMBULATORY_CARE_PROVIDER_SITE_OTHER): Payer: Medicare Other | Admitting: Internal Medicine

## 2020-07-09 VITALS — BP 152/98 | HR 79 | Temp 98.3°F | Ht 67.0 in | Wt 176.0 lb

## 2020-07-09 DIAGNOSIS — F0390 Unspecified dementia without behavioral disturbance: Secondary | ICD-10-CM | POA: Diagnosis not present

## 2020-07-09 DIAGNOSIS — M4005 Postural kyphosis, thoracolumbar region: Secondary | ICD-10-CM

## 2020-07-09 DIAGNOSIS — N1831 Chronic kidney disease, stage 3a: Secondary | ICD-10-CM

## 2020-07-09 DIAGNOSIS — I129 Hypertensive chronic kidney disease with stage 1 through stage 4 chronic kidney disease, or unspecified chronic kidney disease: Secondary | ICD-10-CM | POA: Diagnosis not present

## 2020-07-09 DIAGNOSIS — N183 Chronic kidney disease, stage 3 unspecified: Secondary | ICD-10-CM

## 2020-07-09 DIAGNOSIS — F0280 Dementia in other diseases classified elsewhere without behavioral disturbance: Secondary | ICD-10-CM

## 2020-07-09 NOTE — Patient Instructions (Signed)

## 2020-07-09 NOTE — Progress Notes (Signed)
f I,[name],acting as a Education administrator for [name], MD.,have documented all relevant documentation on the behalf of [name], MD,as directed by  [name], MD while in the presence of [name], MD.  This visit occurred during the SARS-CoV-2 public health emergency.  Safety protocols were in place, including screening questions prior to the visit, additional usage of staff PPE, and extensive cleaning of exam room while observing appropriate contact time as indicated for disinfecting solutions.  Subjective:     Patient ID: Manuel [name] , male    DOB: 03/05/1933 , 87 y.o.   MRN: [id number]   Chief Complaint  Patient presents with  . Hypertension    HPI  The patient is here today for a blood pressure follow-up. He is accompanied by his niece, [name]. She states he is compliant with meds.  He denies having any cp, sob or palpitations. Niece confirms this as well. She states his sleep schedule is off.   Hypertension This is a chronic problem. The current episode started more than 1 year ago. The problem has been gradually improving since onset. The problem is controlled. Pertinent negatives include no blurred vision, chest pain, palpitations or shortness of breath. Risk factors for coronary artery disease include male gender and sedentary lifestyle.     Past Medical History:  Diagnosis Date  . Alzheimer's dementia ([place])   . Hypertension   . Prostate disorder      Family History  Problem Relation Age of Onset  . Early death Mother   . Prostate cancer Father      Current Outpatient Medications:  .  acetaminophen (TYLENOL) 650 MG CR tablet, Take 650 mg by mouth every 8 (eight) hours as needed for pain., Disp: , Rfl:  .  amLODipine (NORVASC) 5 MG tablet, TAKE 1 TABLET BY MOUTH EVERY DAY, Disp: 90 tablet, Rfl: 3 .  Ascorbic Acid (VITAMIN C PO), Take 100 mg by mouth daily., Disp: , Rfl:  .  aspirin EC 81 MG tablet, Take 81 mg by mouth every evening. , Disp:  , Rfl:  .  Calcium Carbonate Antacid (CALCIUM CARBONATE PO), Take 100 mg by mouth. 3 x weekly, Disp: , Rfl:  .  cholecalciferol (VITAMIN D3) 25 MCG (1000 UT) tablet, Take 2,000 Units by mouth every evening., Disp: , Rfl:  .  diphenhydrAMINE HCl, Sleep, (ZZZQUIL PO), Take 1 tablet by mouth at bedtime as needed. The suggested dosing on the box, Disp: , Rfl:  .  LORazepam (ATIVAN) 0.5 MG tablet, Take 1 tablet by mouth 2 (two) times daily. THN remote, Disp: , Rfl:  .  losartan (COZAAR) 100 MG tablet, Take 100 mg by mouth daily. THN remote, Disp: , Rfl:  .  memantine (NAMENDA) 10 MG tablet, Take 1 tablet (10 mg total) by mouth 2 (two) times daily., Disp: 180 tablet, Rfl: 4 .  QUEtiapine (SEROQUEL) 100 MG tablet, Take 100 mg by mouth at bedtime. THN Remote, Disp: , Rfl:  .  tamsulosin (FLOMAX) 0.4 MG CAPS capsule, TAKE 1 CAPSULE BY MOUTH EVERY DAY, Disp: 90 capsule, Rfl: 1 .  traMADol (ULTRAM) 50 MG tablet, Take 50 mg by mouth every 8 (eight) hours as needed., Disp: , Rfl:  .  vitamin E 200 UNIT capsule, Take 200 Units by mouth every evening., Disp: , Rfl:    Allergies  Allergen Reactions  . Penicillins Swelling    Did it involve swelling of the face/tongue/throat, SOB, or low BP? Yes Did it  involve sudden or severe rash/hives, skin peeling, or any reaction on the inside of your mouth or nose? No Did you need to seek medical attention at a hospital or doctor's office? No When did it last happen?2013 If all above answers are "NO", may proceed with cephalosporin use.      Review of Systems  Constitutional: Negative.   Eyes: Negative for blurred vision.  Respiratory: Negative.  Negative for shortness of breath.   Cardiovascular: Negative.  Negative for chest pain and palpitations.  Gastrointestinal: Negative.   Psychiatric/Behavioral: Negative.   All other systems reviewed and are negative.    Today's Vitals   07/09/20 1425 07/09/20 1507  BP: (!) 154/102 (!) 152/98  Pulse: 79   Temp:  98.3 F (36.8 C)   TempSrc: Oral   SpO2: 96%   Weight: 176 lb (79.8 kg)   Height: $Remove'5\' 7"'rVAPraj$  (1.702 m)    Body mass index is 27.57 kg/m.  Wt Readings from Last 3 Encounters:  07/09/20 176 lb (79.8 kg)  06/08/20 189 lb (85.7 kg)  05/27/20 189 lb 9.6 oz (86 kg)   Objective:  Physical Exam Vitals and nursing note reviewed.  Constitutional:      Appearance: Normal appearance. He is obese.  HENT:     Head: Normocephalic and atraumatic.     Nose:     Comments: Masked     Mouth/Throat:     Comments: Masked  Cardiovascular:     Rate and Rhythm: Normal rate and regular rhythm.     Heart sounds: Normal heart sounds.  Pulmonary:     Breath sounds: Normal breath sounds.  Musculoskeletal:     Comments: Severe kyphosis  Skin:    General: Skin is warm.  Neurological:     General: No focal deficit present.     Mental Status: He is alert and oriented to person, place, and time.         Assessment And Plan:     1. Parenchymal renal hypertension, stage 1 through stage 4 or unspecified chronic kidney disease Comments: Chronic, uncontrolled. His caregiver is encouraged to provide him with low sodium snacks. I wil check renal function today.  - CMP14+EGFR - TSH  2. Stage 3 chronic kidney disease, unspecified whether stage 3a or 3b CKD ([place])  3. 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 Alzheimer's disease ([place])  5. Postural kyphosis of thoracolumbar region     Patient was given opportunity to ask questions. Patient verbalized understanding of the plan and was able to repeat key elements of the plan. All questions were answered to their satisfaction.   I, [name], MD, have reviewed all documentation for this visit. The documentation on 07/09/20 for the exam, diagnosis, procedures, and orders are all accurate and complete.   IF YOU HAVE BEEN REFERRED TO A SPECIALIST, IT MAY TAKE 1-2 WEEKS TO SCHEDULE/PROCESS THE REFERRAL. IF YOU HAVE NOT HEARD FROM US/SPECIALIST IN TWO WEEKS,  PLEASE GIVE [place] A CALL AT [phone] X [phone].   THE PATIENT IS ENCOURAGED TO PRACTICE SOCIAL DISTANCING DUE TO THE COVID-19 PANDEMIC.

## 2020-07-10 ENCOUNTER — Ambulatory Visit: Payer: Medicare Other

## 2020-07-10 DIAGNOSIS — I10 Essential (primary) hypertension: Secondary | ICD-10-CM | POA: Diagnosis not present

## 2020-07-10 DIAGNOSIS — F0391 Unspecified dementia with behavioral disturbance: Secondary | ICD-10-CM

## 2020-07-10 DIAGNOSIS — M810 Age-related osteoporosis without current pathological fracture: Secondary | ICD-10-CM | POA: Diagnosis not present

## 2020-07-10 DIAGNOSIS — R296 Repeated falls: Secondary | ICD-10-CM | POA: Diagnosis not present

## 2020-07-10 DIAGNOSIS — R2681 Unsteadiness on feet: Secondary | ICD-10-CM | POA: Diagnosis not present

## 2020-07-10 DIAGNOSIS — N183 Chronic kidney disease, stage 3 unspecified: Secondary | ICD-10-CM

## 2020-07-10 DIAGNOSIS — M6281 Muscle weakness (generalized): Secondary | ICD-10-CM | POA: Diagnosis not present

## 2020-07-10 DIAGNOSIS — E7800 Pure hypercholesterolemia, unspecified: Secondary | ICD-10-CM | POA: Diagnosis not present

## 2020-07-10 LAB — CMP14+EGFR
ALT: 8 IU/L (ref 0–44)
AST: 12 IU/L (ref 0–40)
Albumin/Globulin Ratio: 1.3 (ref 1.2–2.2)
Albumin: 4 g/dL (ref 3.6–4.6)
Alkaline Phosphatase: 85 IU/L (ref 44–121)
BUN/Creatinine Ratio: 10 (ref 10–24)
BUN: 10 mg/dL (ref 8–27)
Bilirubin Total: <0.2 mg/dL (ref 0.0–1.2)
CO2: 23 mmol/L (ref 20–29)
Calcium: 9.4 mg/dL (ref 8.6–10.2)
Chloride: 100 mmol/L (ref 96–106)
Creatinine, Ser: 0.98 mg/dL (ref 0.76–1.27)
Globulin, Total: 3.1 g/dL (ref 1.5–4.5)
Glucose: 96 mg/dL (ref 65–99)
Potassium: 3.8 mmol/L (ref 3.5–5.2)
Sodium: 140 mmol/L (ref 134–144)
Total Protein: 7.1 g/dL (ref 6.0–8.5)
eGFR: 76 mL/min/{1.73_m2} (ref >59)

## 2020-07-10 LAB — TSH: TSH: 2.81 u[IU]/mL (ref 0.450–4.500)

## 2020-07-10 NOTE — Chronic Care Management (AMB) (Signed)
Chronic Care Management    Social Work Note  07/10/2020 Name: Manuel [name] MRN: [id number] DOB: 1932-09-26  Manuel [name] is a 87 y.o. year old male who is a primary care patient of Dorothyann Peng, MD. The CCM team was consulted to assist the patient with chronic disease management and/or care coordination needs related to: Level of Care Concerns.   Engaged with patient sister by phone for follow up visit in response to provider referral for social work chronic care management and care coordination services.   Consent to Services:  The patient was given information about Chronic Care Management services, agreed to services, and gave verbal consent prior to initiation of services.  Please see initial visit note for detailed documentation.   Patient agreed to services and consent obtained.   Assessment: Review of patient past medical history, allergies, medications, and health status, including review of relevant consultants reports was performed today as part of a comprehensive evaluation and provision of chronic care management and care coordination services.     SDOH (Social Determinants of Health) assessments and interventions performed:    Advanced Directives Status: Not addressed in this encounter.  CCM Care Plan  Allergies  Allergen Reactions  . Penicillins Swelling    Did it involve swelling of the face/tongue/throat, SOB, or low BP? Yes Did it involve sudden or severe rash/hives, skin peeling, or any reaction on the inside of your mouth or nose? No Did you need to seek medical attention at a hospital or doctor's office? No When did it last happen?2013 If all above answers are "NO", may proceed with cephalosporin use.     Outpatient Encounter Medications as of 07/10/2020  Medication Sig  . acetaminophen (TYLENOL) 650 MG CR tablet Take 650 mg by mouth every 8 (eight) hours as needed for pain.  [name] amLODipine (NORVASC) 5 MG tablet TAKE 1 TABLET BY MOUTH EVERY DAY  .  Ascorbic Acid (VITAMIN C PO) Take 100 mg by mouth daily.  [name] aspirin EC 81 MG tablet Take 81 mg by mouth every evening.   . Calcium Carbonate Antacid (CALCIUM CARBONATE PO) Take 100 mg by mouth. 3 x weekly  . cholecalciferol (VITAMIN D3) 25 MCG (1000 UT) tablet Take 2,000 Units by mouth every evening.  . diphenhydrAMINE HCl, Sleep, (ZZZQUIL PO) Take 1 tablet by mouth at bedtime as needed. The suggested dosing on the box  . LORazepam (ATIVAN) 0.5 MG tablet Take 1 tablet by mouth 2 (two) times daily. THN remote  . losartan (COZAAR) 100 MG tablet Take 100 mg by mouth daily. THN remote  . memantine (NAMENDA) 10 MG tablet Take 1 tablet (10 mg total) by mouth 2 (two) times daily.  . QUEtiapine (SEROQUEL) 100 MG tablet Take 100 mg by mouth at bedtime. [facility]N Remote  . tamsulosin (FLOMAX) 0.4 MG CAPS capsule TAKE 1 CAPSULE BY MOUTH EVERY DAY  . traMADol (ULTRAM) 50 MG tablet Take 50 mg by mouth every 8 (eight) hours as needed.  . vitamin E 200 UNIT capsule Take 200 Units by mouth every evening.   No facility-administered encounter medications on file as of 07/10/2020.    Patient Active Problem List   Diagnosis Date Noted  . Moderate dementia with behavioral disturbance (HCC) 11/26/2018  . FUO (fever of unknown origin) 06/24/2018  . Essential hypertension, benign 06/06/2018  . Other abnormal glucose 06/06/2018  . Pure hypercholesterolemia 06/06/2018    Conditions to be addressed/monitored: HTN, CKD Stage III and Dementia; Level of care concerns  Care Plan : Social Work Care Plan  Updates made by Bevelyn Ngo since 07/10/2020 12:00 AM    Problem: Care Coordination   Priority: Low    Goal: Achieve a normal sleep routine Completed 07/10/2020  Start Date: 04/28/2020  Expected End Date: 06/27/2020  Priority: High  Note:   Current Barriers:  . Progressive Dementia causing a change to patients sleep pattern . Lack of activity during the day to keep patient engaged . Limited effectiveness of current  sleep medication . Chronic conditions including HTN, Dementia, and CKD III which put patient at increased risk of hospitalization  Social Work Clinical Goal(s):  [name] Over the next 60 days, patient will work with care team to address needs related to sleep routine . Over the next 20 days, patient will follow up with neurologist as directed by SW  3.18.22- Patient continues to not sleep well. See new goal for effective long term care planning due to patients ongoing sleep concerns  Interventions: . 1:1 collaboration with Dorothyann Peng, MD regarding development and update of comprehensive plan of care as evidenced by provider attestation and co-signature . Inter-disciplinary care team collaboration (see longitudinal plan of care) . Successful outbound call placed to the patients caregivers Manuel [name] and Manuel [name] to assess for care coordination needs . Determined the patient is not sleeping well even after recent increased of Seroquel from 25 mg to 50 mg QHS . Discussed the patients family would like another increase in medication and/or medication change to make the patient sleep through the night . Assessed for patient current routine - the patient gets ready for bed between 7-8pm each night. By 11pm he is up every hour saying "good morning". Once the patient eats breakfast the following day he goes to sleep for several hours . Discussed it is common for someone with Dementia to get their days and nights mixed up . Provided education on the importance of engaging the patient during the day to help get sleep schedule back on track . Assessed for patient caregiver interest in enrolling patient in an adult day program - family declined stating it is too cold outside and they fear the patient would sleep the entire program since he is up all night . Re-iterated to the family the importance of engaging the patient during the day to help him sleep at night - family member stated "we just need  medicine strong enough to make him sleep" . Discussed medications do not always solve sleeping pattern concern . Performed chart review to note upcomming appointment scheduled with neurologist  . Advised the patients family caregivers to discuss sleep pattern concern at upcomming appointment . Determined Manuel [name], the patients niece would like medications changed by Dr. Allyne Gee prior to neurology appointment . Collaboration with Dr. Allyne Gee to advise of above interventions and determine if she agrees with making medication changes as requested by the patients family  Patient Goals/Self-Care Activities Over the next 20 days, patient will:   - Patient will self administer medications as prescribed Patient will attend all scheduled provider appointments Patient will call provider office for new concerns or questions Contact SW as needed prior to next scheduled call  Follow up Plan: SW will follow up with patient by phone over the next month    Problem: Long-Term Care Planning     Long-Range Goal: Effective Long-Term Care Planning   Start Date: 07/10/2020  Expected End Date: 11/07/2020  Priority: High  Note:   Current Barriers:  . Chronic  disease management support and education needs related to HTN, CKD Stage III, and Dementia  . ADL IADL limitations . Interrupted sleep pattern  Social Worker Clinical Goal(s):  [name] Over the next 120 days, patients family will work with SW to identify long term care plans to meet the patients needs  CCM SW Interventions:  . Inter-disciplinary care team collaboration (see longitudinal plan of care) . Collaboration with Dorothyann Peng, MD regarding development and update of comprehensive plan of care as evidenced by provider attestation and co-signature . Successful outbound call placed to the patients sister and caregiver Manuel [name] . Discussed the patient continues to have interrupted sleep patterns and is not sleeping through the  night . Determined the patients sister is interested in placement into and assisted living memory care facility . Provided verbal education on the process of placement including the costs of placement and how to apply for long term care Medicaid . Discussed Manuel [name] is unsure if she would like to apply for Medicaid due to the patients name being on the loan of her home. Unfortunately, they are unable to afford long term care private pay . Advised Manuel [name] she could contact an Retail buyer to discuss concerns and obtain legal guidance . Provided Manuel [name] with the contact to the [employer] in [place] . Educated Manuel [name] on [facility] informing her of services the patient may receive if he qualifies for the program . Provided Manuel [name] with the contact number to [facility] for her to gather more information . Scheduled follow up call over the next 21 days  Patient Goals/Self-Care Activities . Over the next 21 days, patient will: With the help of his sister  -  Contact [employer] to follow up on long term care Medicaid questions Contact [facility] to obtain enrollment information Contact SW as needed prior to next scheduled call  Follow Up Plan:  SW will follow up with the patient and his caregiver over the next 21 days       Follow Up Plan: SW will follow up with patient by phone over the next 21 days.      Bevelyn Ngo, BSW, CDP Social Worker, Certified Dementia Practitioner [facility] Care Management [phone]  Total time spent performing care coordination and/or care management activities with the patient by phone or face to face = 49 minutes.

## 2020-07-10 NOTE — Patient Instructions (Signed)
Social Worker Visit Information  Goals we discussed today:  Goals Addressed            This Visit's Progress   . Effective Long Term Care Planning       Timeframe:  Long-Range Goal Priority:  High Start Date:  3.18.22                           Expected End Date: 7.16.22                       Next planned outreach: 4.4.22  Patient Goals/Self-Care Activities . Over the next 21 days, patient will: With the help of his sister  - Contact [employer] to follow up on long term care Medicaid questions Contact [facility] to obtain enrollment information Contact SW as needed prior to next scheduled call        Materials Provided: Verbal education about long term care planning provided by phone  Follow Up Plan: SW will follow up with patient by phone over the next 21 days.   Bevelyn Ngo, BSW, CDP Social Worker, Certified Dementia Practitioner [facility] Care Management [phone]

## 2020-07-12 DIAGNOSIS — R54 Age-related physical debility: Secondary | ICD-10-CM | POA: Diagnosis not present

## 2020-07-12 DIAGNOSIS — M6281 Muscle weakness (generalized): Secondary | ICD-10-CM | POA: Diagnosis not present

## 2020-07-14 ENCOUNTER — Telehealth: Payer: Self-pay

## 2020-07-14 ENCOUNTER — Ambulatory Visit: Payer: Self-pay

## 2020-07-14 ENCOUNTER — Telehealth: Payer: Medicare Other

## 2020-07-14 ENCOUNTER — Other Ambulatory Visit: Payer: Self-pay | Admitting: Internal Medicine

## 2020-07-14 DIAGNOSIS — I10 Essential (primary) hypertension: Secondary | ICD-10-CM

## 2020-07-14 DIAGNOSIS — F0391 Unspecified dementia with behavioral disturbance: Secondary | ICD-10-CM

## 2020-07-14 DIAGNOSIS — I129 Hypertensive chronic kidney disease with stage 1 through stage 4 chronic kidney disease, or unspecified chronic kidney disease: Secondary | ICD-10-CM

## 2020-07-14 NOTE — Telephone Encounter (Signed)
  Chronic Care Management   Outreach Note  07/14/2020 Name: Manuel [name] MRN: [id number] DOB: 1932-05-29  Referred by: Dorothyann Peng, MD Reason for referral : Chronic Care Management   SW placed an unsuccessful call to the patients sister and niece in response to a voice message received requesting a return call. SW left a HIPAA compliant voice message.  Follow Up Plan: SW will follow up with the patient as previously scheduled unless a return call is received prior to next scheduled outreach.  Bevelyn Ngo, BSW, CDP Social Worker, Certified Dementia Practitioner [facility] Care Management [phone]

## 2020-07-15 ENCOUNTER — Ambulatory Visit: Payer: Medicare Other

## 2020-07-15 DIAGNOSIS — I129 Hypertensive chronic kidney disease with stage 1 through stage 4 chronic kidney disease, or unspecified chronic kidney disease: Secondary | ICD-10-CM

## 2020-07-15 DIAGNOSIS — R2681 Unsteadiness on feet: Secondary | ICD-10-CM | POA: Diagnosis not present

## 2020-07-15 DIAGNOSIS — M810 Age-related osteoporosis without current pathological fracture: Secondary | ICD-10-CM | POA: Diagnosis not present

## 2020-07-15 DIAGNOSIS — N183 Chronic kidney disease, stage 3 unspecified: Secondary | ICD-10-CM

## 2020-07-15 DIAGNOSIS — F0391 Unspecified dementia with behavioral disturbance: Secondary | ICD-10-CM

## 2020-07-15 DIAGNOSIS — I10 Essential (primary) hypertension: Secondary | ICD-10-CM

## 2020-07-15 DIAGNOSIS — M6281 Muscle weakness (generalized): Secondary | ICD-10-CM | POA: Diagnosis not present

## 2020-07-15 DIAGNOSIS — R296 Repeated falls: Secondary | ICD-10-CM | POA: Diagnosis not present

## 2020-07-15 DIAGNOSIS — E7800 Pure hypercholesterolemia, unspecified: Secondary | ICD-10-CM | POA: Diagnosis not present

## 2020-07-15 NOTE — Patient Instructions (Signed)
Social Worker Visit Information  Goals we discussed today:  Goals Addressed            This Visit's Progress   . Effective Long Term Care Planning   On track    Timeframe:  Long-Range Goal Priority:  High Start Date:  3.18.22                           Expected End Date: 7.16.22                       Next planned outreach: 4.4.22  Patient Goals/Self-Care Activities . Over the next 21 days, patient will: With the help of his sister  - Review Mailed resources Complete Long Term Care Medicaid Application Contact [facility] to obtain enrollment information Contact SW as needed prior to next scheduled call        Materials Provided: Yes: Provided verbal and written materials on long term care placement and [facility]  Follow Up Plan: SW will follow up with patient by phone over the next 21 days.   Bevelyn Ngo, BSW, CDP Social Worker, Certified Dementia Practitioner [facility] Care Management [phone]

## 2020-07-15 NOTE — Patient Instructions (Signed)
Goals Addressed    . Caregiver Coping Optimized       Timeframe:  Long-Range Goal Priority:  High Start Date: 07/07/20                            Expected End Date: 01/07/21   Next Follow Up date: 08/25/20  Patient Goals/Self-Care Activities:   . Discussed caregiver concerns with PCP and embedded BSW at next scheduled visits as discussed . Establish a long term plan of care after speaking with the patient's healthcare team and consider all options to help improve patient's quality of life and decrease caregiver stress . Continue to contact the CCM team and or PCP for questions or concerns                          . Dementia - Harm or Injury prevented       Timeframe:  Long-Range Goal Priority:  High Start Date: 07/07/20                            Expected End Date: 12/07/20  Next follow up date: 08/25/20  Patient Goals/Self-Care Activities:     . Discuss home safety concerns and worsening dementia with PCP at next visit . Collaborate with embedded BSW for Dementia related resources and Care Coordination needs . Call the CCM team and or PCP for questions or concerns . Keep all scheduled MD follow up appointments                      . Injury related to Fall minimized or prevented       Timeframe:  Long-Range Goal Priority:  High Start Date:  06/30/20                           Expected End Date: 12/31/20  Next Follow Up Date: 08/25/20  - Utilize rolling walker and rollator (assistive device) appropriately with all ambulation - De-clutter walkways - Change positions slowly - Wear secure fitting shoes at all times with ambulation - Utilize home lighting for dim lit areas - Demonstrate self and pet awareness at all times

## 2020-07-15 NOTE — Chronic Care Management (AMB) (Signed)
Chronic Care Management   CCM RN Visit Note  07/14/2020 Name: Manuel [name] MRN: [id number] DOB: 1932/07/05  Subjective: Manuel [name] is a 87 y.o. year old male who is a primary care patient of Dorothyann Peng, MD. The care management team was consulted for assistance with disease management and care coordination needs.    Engaged with patient by telephone for follow up visit in response to provider referral for case management and/or care coordination services.   Consent to Services:  The patient was given information about Chronic Care Management services, agreed to services, and gave verbal consent prior to initiation of services.  Please see initial visit note for detailed documentation.   Patient agreed to services and verbal consent obtained.   Assessment: Review of patient past medical history, allergies, medications, health status, including review of consultants reports, laboratory and other test data, was performed as part of comprehensive evaluation and provision of chronic care management services.   SDOH (Social Determinants of Health) assessments and interventions performed: Yes, no acute challenges   CCM Care Plan  Allergies  Allergen Reactions  . Penicillins Swelling    Did it involve swelling of the face/tongue/throat, SOB, or low BP? Yes Did it involve sudden or severe rash/hives, skin peeling, or any reaction on the inside of your mouth or nose? No Did you need to seek medical attention at a hospital or doctor's office? No When did it last happen?2013 If all above answers are "NO", may proceed with cephalosporin use.     Outpatient Encounter Medications as of 07/14/2020  Medication Sig  . acetaminophen (TYLENOL) 650 MG CR tablet Take 650 mg by mouth every 8 (eight) hours as needed for pain.  [name] amLODipine (NORVASC) 5 MG tablet TAKE 1 TABLET BY MOUTH EVERY DAY  . Ascorbic Acid (VITAMIN C PO) Take 100 mg by mouth daily.  [name] aspirin EC 81 MG tablet Take  81 mg by mouth every evening.   . Calcium Carbonate Antacid (CALCIUM CARBONATE PO) Take 100 mg by mouth. 3 x weekly  . cholecalciferol (VITAMIN D3) 25 MCG (1000 UT) tablet Take 2,000 Units by mouth every evening.  . diphenhydrAMINE HCl, Sleep, (ZZZQUIL PO) Take 1 tablet by mouth at bedtime as needed. The suggested dosing on the box  . LORazepam (ATIVAN) 0.5 MG tablet Take 1 tablet by mouth 2 (two) times daily. [facility] remote  . losartan (COZAAR) 100 MG tablet Take 100 mg by mouth daily. [facility] remote  . memantine (NAMENDA) 10 MG tablet Take 1 tablet (10 mg total) by mouth 2 (two) times daily.  . QUEtiapine (SEROQUEL) 100 MG tablet Take 100 mg by mouth at bedtime. [facility] Remote  . tamsulosin (FLOMAX) 0.4 MG CAPS capsule TAKE 1 CAPSULE BY MOUTH EVERY DAY  . traMADol (ULTRAM) 50 MG tablet Take 50 mg by mouth every 8 (eight) hours as needed.  . vitamin E 200 UNIT capsule Take 200 Units by mouth every evening.   No facility-administered encounter medications on file as of 07/14/2020.    Patient Active Problem List   Diagnosis Date Noted  . Moderate dementia with behavioral disturbance (HCC) 11/26/2018  . FUO (fever of unknown origin) 06/24/2018  . Essential hypertension, benign 06/06/2018  . Other abnormal glucose 06/06/2018  . Pure hypercholesterolemia 06/06/2018    Conditions to be addressed/monitored:Essential Hypertension, Dementia, Parenchymal renal hypertension, stage 1 through 4, CKD stage 3    Care Plan : Fall Risk (Adult)  Updates made by Riley Churches, RN since  07/15/2020 12:00 AM    Problem: Fall Risk   Priority: High    Long-Range Goal: Absence of Fall and Fall-Related Injury   Start Date: 06/30/2020  Expected End Date: 12/31/2020  Recent Progress: On track  Priority: High  Note:   Current Barriers:  [name] Knowledge Deficits related to fall precautions in patient with Dementia . Decreased adherence to prescribed treatment for fall prevention . Unable to self administer medications as  prescribed . Unable to perform ADLs independently . Unable to perform IADLs independently . Chronic Disease Management support and education needs related to Essential Hypertension, Dementia, Parenchymal renal hypertension, stage 1 through 4, CKD stage 3   . Cognitive Deficits Clinical Goal(s):  [name] Patient/caregiver will demonstrate improved adherence to prescribed treatment plan for decreasing falls as evidenced by patient reporting and review of EMR . Patient/caregiver will verbalize using fall risk reduction strategies discussed . Patient/caregiver will not experience additional falls . Patient/caregiver will verbalize understanding of plan for fall prevention plan Interventions:  06/30/20 successful call completed with sister [employer] . Collaboration with Dorothyann Peng, MD regarding development and update of comprehensive plan of care as evidenced by provider attestation and co-signature . Inter-disciplinary care team collaboration (see longitudinal plan of care) . Provided written and verbal education re: Potential causes of falls and Fall prevention strategies . Reviewed medications and discussed potential side effects of medications such as dizziness and frequent urination . Assessed for s/s of orthostatic hypotension . Assessed for falls since last encounter. . Assessed patients knowledge of fall risk prevention secondary to previously provided education. . Assessed working status of life alert bracelet and patient adherence . Evaluation of current treatment plan related to abnormal gait and impaired physical mobility and patient's adherence to plan as established by provider. . Provided education to patient re: fall prevention tips . Provided patient with printed educational materials related to Fall prevention  . Discussed plans with patient for ongoing care management follow up and provided patient with direct contact information for care management team Patient Goals/Self-Care  Activities:  - Utilize rolling walker and rollator (assistive device) appropriately with all ambulation - De-clutter walkways - Change positions slowly - Wear secure fitting shoes at all times with ambulation - Utilize home lighting for dim lit areas - Demonstrate self and pet awareness at all times Follow Up Plan: Telephone follow up appointment with care management team member scheduled for: 08/25/20   Care Plan : Dementia (Adult)  Updates made by Riley Churches, RN since 07/15/2020 12:00 AM    Problem: Caregiver Stress   Priority: High    Long-Range Goal: Caregiver Coping Optimized   Start Date: 07/07/2020  Expected End Date: 12/07/2020  This Visit's Progress: On track  Recent Progress: Not on track  Priority: High  Note:   Current Barriers:   Ineffective Self Health Maintenance  Unable to self administer medications as prescribed  Unable to perform ADLs independently  Unable to perform IADLs independently Clinical Goal(s):  [name] Collaboration with Dorothyann Peng, MD regarding development and update of comprehensive plan of care as evidenced by provider attestation and co-signature . Inter-disciplinary care team collaboration (see longitudinal plan of care)  patient will work with care management team to address care coordination and chronic disease management needs related to Disease Management  Educational Needs  Care Coordination  Medication Management and Education  Medication Reconciliation  Psychosocial Support  Dementia and Caregiver Support   Interventions:  07/14/20 successful outbound call completed with sister [name] Zachery Dauer  Evaluation of current treatment plan related to Dementia and Caregiver Stress, and patient's adherence to plan as established by provider.  Collaboration with Dorothyann PengSanders, Robyn, MD regarding development and update of comprehensive plan of care as evidenced by provider attestation       and co-signature  Inter-disciplinary care team  collaboration (see longitudinal plan of care)  Determined patient completed his follow up appointment with Dr. Allyne GeeSanders at which time she reported her concerns regarding Mr. Shawler worsening dementia and fall risk  Determined the family is interested in working with the embedded BSW to learn about adult daycare options that may be a good solution   Discussed and reviewed next scheduled follow up with BSW in order to discuss resources  Discussed plans with patient for ongoing care management follow up and provided patient with direct contact information for care management team Self Care Activities:  . Discussed caregiver concerns with PCP and embedded BSW at next scheduled visits as discussed . Establish a long term plan of care after speaking with the patient's healthcare team and consider all options to help improve patient's quality of life and decrease caregiver stress . Continue to contact the CCM team and or PCP for questions or concerns  Patient Goals: - to keep him safe at home and or consider having him placed in a skilled nursing facility   Follow Up Plan: Telephone follow up appointment with care management team member scheduled for: 08/25/20  Problem: Harm or Injury   Priority: High    Long-Range Goal: Harm or Injury Prevented   Start Date: 07/07/2020  Expected End Date: 12/07/2020  Recent Progress: Not on track  Priority: High  Note:   Current Barriers:   Ineffective Self Health Maintenance  Unable to self administer medications as prescribed  Lacks social connections  Unable to perform ADLs independently  Unable to perform IADLs independently Clinical Goal(s):  [name]. Collaboration with Dorothyann PengSanders, Robyn, MD regarding development and update of comprehensive plan of care as evidenced by provider attestation and co-signature . Inter-disciplinary care team collaboration (see longitudinal plan of care)  patient will work with care management team to address care coordination  and chronic disease management needs related to Disease Management  Educational Needs  Care Coordination  Medication Management and Education  Psychosocial Support  Dementia and Caregiver Support   Interventions:   Evaluation of current treatment plan related to Dementia and patient's adherence to plan as established by provider.  Collaboration with Dorothyann PengSanders, Robyn, MD regarding development and update of comprehensive plan of care as evidenced by provider attestation       and co-signature  Inter-disciplinary care team collaboration (see longitudinal plan of care)  Encouraged environmental modifications as functional and perceptual abilities change.   Consider referral to physical therapy for muscle strengthening and balance retraining program. (patient has completed)  Assessed ability to take, or for family/caregiver to give medications correctly and safely.   Provided anticipatory guidance about potential for wandering and falls; encourage home-monitoring device, medical alert jewelry and emergency call system.   - assistive or adaptive device use encouraged  - balance, gait and fall risk reviewed  - barriers to safety identified  - knowledge of how to summon emergency services ensured  - medication side effects managed  - modification of home and work environment promoted  - safety plan developed  Reviewed and discussed next PCP OV scheduled with Dr. Allyne GeeSanders this week on 07/09/20 @2 :15 pm; Embedded BSW follow up call scheduled for 07/10/20  Sent secure message to PCP Dr. Allyne Gee and embedded BSW Bevelyn Ngo regarding family's report of patient's worsening dementia and their safety concerns due to patient having an increase in falls; advised the family would like assistance with possible placement of patient into a SNF   Discussed plans with patient for ongoing care management follow up and provided patient with direct contact information for care management  team Self Care Activities:  . Discuss home safety concerns and worsening dementia with PCP at next visit . Collaborate with embedded BSW for Dementia related resources and Care Coordination needs . Call the CCM team and or PCP for questions or concerns . Keep all scheduled MD follow up appointments  Patient Goals: - to keep him safe at home   Next Follow Up Date: 08/25/20    Plan:Telephone follow up appointment with care management team member scheduled for:  08/31/20  Delsa Sale, RN, BSN, CCM Care Management Coordinator [facility] Care Management/[facility]  Direct Phone: [phone]

## 2020-07-15 NOTE — Chronic Care Management (AMB) (Cosign Needed Addendum)
Chronic Care Management    Social Work Note  07/15/2020 Name: Manuel [name] MRN: [id number] DOB: [birth year]  Manuel [name] is a 87 y.o. year old male who is a primary care patient of [name], MD. The CCM team was consulted to assist the patient with chronic disease management and/or care coordination needs related to: Level of Care Concerns.   Engaged with patients sister/caregiver by phone for follow up visit in response to provider referral for social work chronic care management and care coordination services.   Consent to Services:  The patient was given information about Chronic Care Management services, agreed to services, and gave verbal consent prior to initiation of services.  Please see initial visit note for detailed documentation.   Patient agreed to services and consent obtained.   Assessment: Review of patient past medical history, allergies, medications, and health status, including review of relevant consultants reports was performed today as part of a comprehensive evaluation and provision of chronic care management and care coordination services.     SDOH (Social Determinants of Health) assessments and interventions performed:    Advanced Directives Status: Not addressed in this encounter.  CCM Care Plan  Allergies  Allergen Reactions  . Penicillins Swelling    Did it involve swelling of the face/tongue/throat, SOB, or low BP? Yes Did it involve sudden or severe rash/hives, skin peeling, or any reaction on the inside of your mouth or nose? No Did you need to seek medical attention at a hospital or doctor's office? No When did it last happen?2013 If all above answers are "NO", may proceed with cephalosporin use.     Outpatient Encounter Medications as of 07/15/2020  Medication Sig  . acetaminophen (TYLENOL) 650 MG CR tablet Take 650 mg by mouth every 8 (eight) hours as needed for pain.  [name] amLODipine (NORVASC) 5 MG tablet TAKE 1 TABLET BY MOUTH  EVERY DAY  . Ascorbic Acid (VITAMIN C PO) Take 100 mg by mouth daily.  [name] aspirin EC 81 MG tablet Take 81 mg by mouth every evening.   . Calcium Carbonate Antacid (CALCIUM CARBONATE PO) Take 100 mg by mouth. 3 x weekly  . cholecalciferol (VITAMIN D3) 25 MCG (1000 UT) tablet Take 2,000 Units by mouth every evening.  . diphenhydrAMINE HCl, Sleep, (ZZZQUIL PO) Take 1 tablet by mouth at bedtime as needed. The suggested dosing on the box  . LORazepam (ATIVAN) 0.5 MG tablet Take 1 tablet by mouth 2 (two) times daily. THN remote  . losartan (COZAAR) 100 MG tablet Take 100 mg by mouth daily. THN remote  . memantine (NAMENDA) 10 MG tablet Take 1 tablet (10 mg total) by mouth 2 (two) times daily.  . QUEtiapine (SEROQUEL) 100 MG tablet Take 100 mg by mouth at bedtime. THN Remote  . tamsulosin (FLOMAX) 0.4 MG CAPS capsule TAKE 1 CAPSULE BY MOUTH EVERY DAY  . traMADol (ULTRAM) 50 MG tablet Take 50 mg by mouth every 8 (eight) hours as needed.  . vitamin E 200 UNIT capsule Take 200 Units by mouth every evening.   No facility-administered encounter medications on file as of 07/15/2020.    Patient Active Problem List   Diagnosis Date Noted  . Moderate dementia with behavioral disturbance ([place]) 11/26/2018  . FUO (fever of unknown origin) 06/24/2018  . Essential hypertension, benign 06/06/2018  . Other abnormal glucose 06/06/2018  . Pure hypercholesterolemia 06/06/2018    Conditions to be addressed/monitored: HTN, CKD Stage III and Dementia; Level of care concerns  Patient Care Plan: Social Work Care Plan    Problem Identified: Care Coordination   Priority: Low    Goal: Achieve a normal sleep routine Completed 07/10/2020  Start Date: 04/28/2020  Expected End Date: 06/27/2020  Priority: High  Note:   Current Barriers:  . Progressive Dementia causing a change to patients sleep pattern . Lack of activity during the day to keep patient engaged . Limited effectiveness of current sleep  medication . Chronic conditions including HTN, Dementia, and CKD III which put patient at increased risk of hospitalization  Social Work Clinical Goal(s):  [name] Over the next 60 days, patient will work with care team to address needs related to sleep routine . Over the next 20 days, patient will follow up with neurologist as directed by SW  3.18.22- Patient continues to not sleep well. See new goal for effective long term care planning due to patients ongoing sleep concerns  Interventions: . 1:1 collaboration with [name], MD regarding development and update of comprehensive plan of care as evidenced by provider attestation and co-signature . Inter-disciplinary care team collaboration (see longitudinal plan of care) . Successful outbound call placed to the patients caregivers [name] and [name] to assess for care coordination needs . Determined the patient is not sleeping well even after recent increased of Seroquel from 25 mg to 50 mg QHS . Discussed the patients family would like another increase in medication and/or medication change to make the patient sleep through the night . Assessed for patient current routine - the patient gets ready for bed between 7-8pm each night. By 11pm he is up every hour saying "good morning". Once the patient eats breakfast the following day he goes to sleep for several hours . Discussed it is common for someone with Dementia to get their days and nights mixed up . Provided education on the importance of engaging the patient during the day to help get sleep schedule back on track . Assessed for patient caregiver interest in enrolling patient in an adult day program - family declined stating it is too cold outside and they fear the patient would sleep the entire program since he is up all night . Re-iterated to the family the importance of engaging the patient during the day to help him sleep at night - family member stated "we just need medicine  strong enough to make him sleep" . Discussed medications do not always solve sleeping pattern concern . Performed chart review to note upcomming appointment scheduled with neurologist  . Advised the patients family caregivers to discuss sleep pattern concern at [facility] appointment . Determined [name], the patients niece would like medications changed by Dr. Baird Cancer prior to neurology appointment . Collaboration with Dr. Baird Cancer to advise of above interventions and determine if she agrees with making medication changes as requested by the patients family  Patient Goals/Self-Care Activities Over the next 20 days, patient will:   - Patient will self administer medications as prescribed Patient will attend all scheduled provider appointments Patient will call provider office for new concerns or questions Contact SW as needed prior to next scheduled call  Follow up Plan: SW will follow up with patient by phone over the next month    Long-Range Goal: Collaborate with RN Care Manager to perform appropriate assessments to assist with care coordination needs   Start Date: 04/03/2020  Expected End Date: 07/02/2020  This Visit's Progress: On track  Recent Progress: On track  Priority: Low  Note:  Current Barriers:  . ADL IADL limitations . Memory Deficits . Chronic conditions including HTN, Dementia, and CKD III which put patient at increased risk of hospitaliation . Unable to self administer medications as prescribed  Social Work Clinical Goal(s):  [name] Over the next 30 days the patient will work with [facility] as ordered by his primary care physician Goal not met due to staffing challenges . Over the next 120 days the patient will work with SW to address care coordination needs  CCM SW Interventions: . 1:1 collaboration with [name], MD regarding development and update of comprehensive plan of care as evidenced by provider attestation and co-signature . Inter-disciplinary  care team collaboration (see longitudinal plan of care) . Successful outbound call placed to the patients caregiver [name] to assess for care coordination needs . Discussed the patient is in need of a handheld shower as well as grab bars in the shower . Educated [name] on the Aging Gracefully program which is offered by [facility] declined referral stating her mother had received services from them in the past and they prefer to not work with them anymore . Reviewed the opportunity to order a handheld shower from patients over the counter catalog - [name] confirmed knowledge of this benefit and indicated she would log in to see how many credits the patient has left this quarter . Determined the patient is working with PT through [facility] and doing well . Discussed the patient has a shower chair but refuses to sit while in the shower even though that is the safest - family will continue to encourage the patient to remain seated while in the shower . Collaboration with RN Care Manager to inform of interventions and plan   Patient Goals/Self-Care Activities Over the next 60 days, patient will: With the help of his sister and niece  - Patient will self administer medications as prescribed Patient will attend all scheduled provider appointments Patient will call pharmacy for medication refills Patient will call provider office for new concerns or questions Contact SW as needed prior to next scheduled call Order a handheld shower under OTC benefit Engage with PT  Follow up Plan: SW will follow up with patient by phone over the next 60 days    Problem Identified: Long-Term Care Planning     Long-Range Goal: Effective Long-Term Care Planning   Start Date: 07/10/2020  Expected End Date: 11/07/2020  This Visit's Progress: On track  Priority: High  Note:   Current Barriers:  . Chronic disease management support and education needs related to HTN, CKD Stage III, and  Dementia  . ADL IADL limitations . Interrupted sleep pattern  Social Worker Clinical Goal(s):  [name] Over the next 120 days, patients family will work with SW to identify long term care plans to meet the patients needs  CCM SW Interventions:  . Inter-disciplinary care team collaboration (see longitudinal plan of care) . Collaboration with [name], MD regarding development and update of comprehensive plan of care as evidenced by provider attestation and co-signature . Successful outbound call placed to the patients sister and caregiver Rondell Reams in response to a voice message requesting SW follow up . Discussed Mrs. Drema Dallas has spoken with an attorney who indicated that since the patients name was not on the deed to the home, she did not have to worry about Medicaid looking at this as an asset . Determined Mrs. Drema Dallas would like to proceed with long term care placement .  Advised Mrs. Drema Dallas she would need to apply for [place] Medicaid on behalf of the patient - Mrs. Drema Dallas stated understanding and requests SW mail her an application . Discussed the patient may qualify for Assisted Living and/or Skilled Nursing pending which level of care his provider feels is most appropriate . Reviewed the families continued interest in an adult day program . Provided verbal education on local adult day programs - Mrs. Barnes expressed interest in [facility] . Advised Mrs. Barnes to follow up with [facility] for more information and to schedule a tour . Provided resources by mail including Medicaid application, [facility] brochure, List of ALF in [facility], List of SNF in [place] 11:23 am . Inbound call received from [name] to follow up on SW plans to assist with patient level of care needs . Discussed above interventions with [name]- [name] indicated her mother did not share the above plans and prefers staff to contact her rather than Mrs. Drema Dallas due to concerns of Mrs. Drema Dallas  forgetting to share pertinent information about the patient . Determined the patient was difficult to arouse this morning and needed max assistance with morning care - [name] feels this is due to medications which she reports she is working with Consulting civil engineer about medication management . Collaboration with Barb Merino, RN Care Manager regarding above statement . Discussed [name] is interested in the patient enrolling in [facility] . Verbal education about [facility] provided . Provided [name] with contact number to [facility] - advised her to contact [facility] directly to schedule a tour and discuss eligibility  Patient Goals/Self-Care Activities . Over the next 21 days, patient will: With the help of his sister  -  Review Mailed resources Complete Long Term Care Medicaid Application Contact [facility] to obtain enrollment information Contact SW as needed prior to next scheduled call  Follow Up Plan:  SW will follow up with the patient and his caregiver over the next 21 days           Follow Up Plan: SW will follow up with patient by phone over the next 21 days.      [name], BSW, CDP Social Worker, Certified Dementia Practitioner [facility] [phone]  Total time spent performing care coordination and/or care management activities with the patient by phone or face to face = 58 minutes.

## 2020-07-20 ENCOUNTER — Telehealth: Payer: Medicare Other

## 2020-07-20 DIAGNOSIS — R2681 Unsteadiness on feet: Secondary | ICD-10-CM | POA: Diagnosis not present

## 2020-07-20 DIAGNOSIS — M810 Age-related osteoporosis without current pathological fracture: Secondary | ICD-10-CM | POA: Diagnosis not present

## 2020-07-20 DIAGNOSIS — M6281 Muscle weakness (generalized): Secondary | ICD-10-CM | POA: Diagnosis not present

## 2020-07-20 DIAGNOSIS — I10 Essential (primary) hypertension: Secondary | ICD-10-CM | POA: Diagnosis not present

## 2020-07-20 DIAGNOSIS — E7800 Pure hypercholesterolemia, unspecified: Secondary | ICD-10-CM | POA: Diagnosis not present

## 2020-07-20 DIAGNOSIS — R296 Repeated falls: Secondary | ICD-10-CM | POA: Diagnosis not present

## 2020-07-27 ENCOUNTER — Ambulatory Visit (INDEPENDENT_AMBULATORY_CARE_PROVIDER_SITE_OTHER): Payer: Medicare Other

## 2020-07-27 DIAGNOSIS — I10 Essential (primary) hypertension: Secondary | ICD-10-CM

## 2020-07-27 DIAGNOSIS — F0391 Unspecified dementia with behavioral disturbance: Secondary | ICD-10-CM | POA: Diagnosis not present

## 2020-07-27 DIAGNOSIS — N183 Chronic kidney disease, stage 3 unspecified: Secondary | ICD-10-CM

## 2020-07-28 NOTE — Chronic Care Management (AMB) (Signed)
Chronic Care Management    Social Work Note  07/28/2020 Name: Manuel [name] MRN: [id number] DOB: 1933/04/04  Manuel [name] is a 87 y.o. year old male who is a primary care patient of Manuel Peng, MD. The CCM team was consulted to assist the patient with chronic disease management and/or care coordination needs related to: Level of Care Concerns.   Engaged with patients niece by phone for follow up visit in response to provider referral for social work chronic care management and care coordination services.   Consent to Services:  The patient was given information about Chronic Care Management services, agreed to services, and gave verbal consent prior to initiation of services.  Please see initial visit note for detailed documentation.   Patient agreed to services and consent obtained.   Assessment: Review of patient past medical history, allergies, medications, and health status, including review of relevant consultants reports was performed today as part of a comprehensive evaluation and provision of chronic care management and care coordination services.     SDOH (Social Determinants of Health) assessments and interventions performed:    Advanced Directives Status: Not addressed in this encounter.  CCM Care Plan  Allergies  Allergen Reactions  . Penicillins Swelling    Did it involve swelling of the face/tongue/throat, SOB, or low BP? Yes Did it involve sudden or severe rash/hives, skin peeling, or any reaction on the inside of your mouth or nose? No Did you need to seek medical attention at a hospital or doctor's office? No When did it last happen?2013 If all above answers are "NO", may proceed with cephalosporin use.     Outpatient Encounter Medications as of 07/27/2020  Medication Sig  . acetaminophen (TYLENOL) 650 MG CR tablet Take 650 mg by mouth every 8 (eight) hours as needed for pain.  [name] amLODipine (NORVASC) 5 MG tablet TAKE 1 TABLET BY MOUTH EVERY DAY  .  Ascorbic Acid (VITAMIN C PO) Take 100 mg by mouth daily.  [name] aspirin EC 81 MG tablet Take 81 mg by mouth every evening.   . Calcium Carbonate Antacid (CALCIUM CARBONATE PO) Take 100 mg by mouth. 3 x weekly  . cholecalciferol (VITAMIN D3) 25 MCG (1000 UT) tablet Take 2,000 Units by mouth every evening.  . diphenhydrAMINE HCl, Sleep, (ZZZQUIL PO) Take 1 tablet by mouth at bedtime as needed. The suggested dosing on the box  . LORazepam (ATIVAN) 0.5 MG tablet Take 1 tablet by mouth 2 (two) times daily. THN remote  . losartan (COZAAR) 100 MG tablet Take 100 mg by mouth daily. THN remote  . memantine (NAMENDA) 10 MG tablet Take 1 tablet (10 mg total) by mouth 2 (two) times daily.  . QUEtiapine (SEROQUEL) 100 MG tablet Take 100 mg by mouth at bedtime. THN Remote  . tamsulosin (FLOMAX) 0.4 MG CAPS capsule TAKE 1 CAPSULE BY MOUTH EVERY DAY  . traMADol (ULTRAM) 50 MG tablet Take 50 mg by mouth every 8 (eight) hours as needed.  . vitamin E 200 UNIT capsule Take 200 Units by mouth every evening.   No facility-administered encounter medications on file as of 07/27/2020.    Patient Active Problem List   Diagnosis Date Noted  . Moderate dementia with behavioral disturbance ([facility]) 11/26/2018  . FUO (fever of unknown origin) 06/24/2018  . Essential hypertension, benign 06/06/2018  . Other abnormal glucose 06/06/2018  . Pure hypercholesterolemia 06/06/2018    Conditions to be addressed/monitored: HTN, CKD Stage III and Dementia; Level of care concerns and  Limited access to caregiver  Care Plan : Social Work Care Plan  Updates made by Bevelyn Ngo since 07/28/2020 12:00 AM    Problem: Long-Term Care Planning     Long-Range Goal: Effective Long-Term Care Planning   Start Date: 07/10/2020  Expected End Date: 11/07/2020  This Visit's Progress: On track  Recent Progress: On track  Priority: High  Note:   Late entry from 4.4.22 Current Barriers:  . Chronic disease management support and education needs  related to HTN, CKD Stage III, and Dementia  . ADL IADL limitations . Interrupted sleep pattern  Social Worker Clinical Goal(s):  [name] Over the next 120 days, patients family will work with SW to identify long term care plans to meet the patients needs  CCM SW Interventions:  . Inter-disciplinary care team collaboration (see longitudinal plan of care) . Collaboration with Manuel Peng, MD regarding development and update of comprehensive plan of care as evidenced by provider attestation and co-signature . Successful outbound call placed to the patients niece and caregiver [name] to assess goal progression . Discussed Mrs. Tanya Nones has toured [facility] and feels this is a good option for the patient . Determined Mrs. Tanya Nones is actively working to complete application to determine patients eligibility . Planned admission date to the program is for Aug 23 2020 . Advised Mrs. Tanya Nones that once the patient is enrolled as a [facility] participant he will no longer be eligible to participate with our chronic care management program - she stated understanding . Scheduled follow up call over the next month to confirm patients enrollment with [facility] prior to closing patient to CCM program . Encouraged Mrs. Tanya Nones to contact SW as needed prior to next scheduled call  Patient Goals/Self-Care Activities . Over the next 21 days, patient will: With the help of his sister and niece  - Patient will self administer medications as prescribed Patient will attend all scheduled provider appointments Review Mailed resources Engage with [facility] SW to complete admission application Contact SW as needed prior to next scheduled call  Follow Up Plan:  SW will follow up with the patient and his caregiver over the next month       Follow Up Plan: SW will follow up with patient by phone over the next month      Bevelyn Ngo, BSW, CDP Social Worker, Certified Dementia Practitioner [facility] Care  Management [phone]  Total time spent performing care coordination and/or care management activities with the patient by phone or face to face = 42 minutes.

## 2020-07-28 NOTE — Patient Instructions (Signed)
Social Worker Visit Information  Goals we discussed today:  Goals Addressed            This Visit's Progress   . Effective Long Term Care Planning   On track    Timeframe:  Long-Range Goal Priority:  High Start Date:  3.18.22                           Expected End Date: 7.16.22                       Next planned outreach: 5.2.22  Patient Goals/Self-Care Activities . Over the next 21 days, patient will: With the help of his sister - Patient will self administer medications as prescribed Patient will attend all scheduled provider appointments Review Mailed resources Engage with [facility] SW to complete admission application Contact SW as needed prior to next scheduled call        Follow Up Plan: SW will follow up with patient by phone over the next month   Bevelyn Ngo, BSW, CDP Social Worker, Certified Dementia Practitioner [facility] Management [phone]

## 2020-07-30 DIAGNOSIS — R296 Repeated falls: Secondary | ICD-10-CM | POA: Diagnosis not present

## 2020-07-30 DIAGNOSIS — I10 Essential (primary) hypertension: Secondary | ICD-10-CM | POA: Diagnosis not present

## 2020-07-30 DIAGNOSIS — M810 Age-related osteoporosis without current pathological fracture: Secondary | ICD-10-CM | POA: Diagnosis not present

## 2020-07-30 DIAGNOSIS — R2681 Unsteadiness on feet: Secondary | ICD-10-CM | POA: Diagnosis not present

## 2020-07-30 DIAGNOSIS — M6281 Muscle weakness (generalized): Secondary | ICD-10-CM | POA: Diagnosis not present

## 2020-07-30 DIAGNOSIS — E7800 Pure hypercholesterolemia, unspecified: Secondary | ICD-10-CM | POA: Diagnosis not present

## 2020-08-04 DIAGNOSIS — I10 Essential (primary) hypertension: Secondary | ICD-10-CM | POA: Diagnosis not present

## 2020-08-04 DIAGNOSIS — M810 Age-related osteoporosis without current pathological fracture: Secondary | ICD-10-CM | POA: Diagnosis not present

## 2020-08-04 DIAGNOSIS — M6281 Muscle weakness (generalized): Secondary | ICD-10-CM | POA: Diagnosis not present

## 2020-08-04 DIAGNOSIS — E7800 Pure hypercholesterolemia, unspecified: Secondary | ICD-10-CM | POA: Diagnosis not present

## 2020-08-04 DIAGNOSIS — R2681 Unsteadiness on feet: Secondary | ICD-10-CM | POA: Diagnosis not present

## 2020-08-04 DIAGNOSIS — R296 Repeated falls: Secondary | ICD-10-CM | POA: Diagnosis not present

## 2020-08-11 DIAGNOSIS — E7800 Pure hypercholesterolemia, unspecified: Secondary | ICD-10-CM | POA: Diagnosis not present

## 2020-08-11 DIAGNOSIS — I10 Essential (primary) hypertension: Secondary | ICD-10-CM | POA: Diagnosis not present

## 2020-08-11 DIAGNOSIS — M6281 Muscle weakness (generalized): Secondary | ICD-10-CM | POA: Diagnosis not present

## 2020-08-11 DIAGNOSIS — R2681 Unsteadiness on feet: Secondary | ICD-10-CM | POA: Diagnosis not present

## 2020-08-11 DIAGNOSIS — M810 Age-related osteoporosis without current pathological fracture: Secondary | ICD-10-CM | POA: Diagnosis not present

## 2020-08-11 DIAGNOSIS — R296 Repeated falls: Secondary | ICD-10-CM | POA: Diagnosis not present

## 2020-08-12 DIAGNOSIS — R54 Age-related physical debility: Secondary | ICD-10-CM | POA: Diagnosis not present

## 2020-08-12 DIAGNOSIS — M6281 Muscle weakness (generalized): Secondary | ICD-10-CM | POA: Diagnosis not present

## 2020-08-15 ENCOUNTER — Other Ambulatory Visit: Payer: Self-pay | Admitting: Nurse Practitioner

## 2020-08-15 ENCOUNTER — Other Ambulatory Visit: Payer: Self-pay | Admitting: Internal Medicine

## 2020-08-19 DIAGNOSIS — M810 Age-related osteoporosis without current pathological fracture: Secondary | ICD-10-CM | POA: Diagnosis not present

## 2020-08-19 DIAGNOSIS — E7800 Pure hypercholesterolemia, unspecified: Secondary | ICD-10-CM | POA: Diagnosis not present

## 2020-08-19 DIAGNOSIS — R296 Repeated falls: Secondary | ICD-10-CM | POA: Diagnosis not present

## 2020-08-19 DIAGNOSIS — M6281 Muscle weakness (generalized): Secondary | ICD-10-CM | POA: Diagnosis not present

## 2020-08-19 DIAGNOSIS — R2681 Unsteadiness on feet: Secondary | ICD-10-CM | POA: Diagnosis not present

## 2020-08-19 DIAGNOSIS — I10 Essential (primary) hypertension: Secondary | ICD-10-CM | POA: Diagnosis not present

## 2020-08-20 ENCOUNTER — Other Ambulatory Visit: Payer: Self-pay | Admitting: Internal Medicine

## 2020-08-20 ENCOUNTER — Other Ambulatory Visit: Payer: Self-pay

## 2020-08-20 MED ORDER — LORAZEPAM 0.5 MG PO TABS: ORAL_TABLET | ORAL | 1 refills | Status: DC

## 2020-08-21 ENCOUNTER — Other Ambulatory Visit: Payer: Self-pay | Admitting: Internal Medicine

## 2020-08-21 MED ORDER — QUETIAPINE FUMARATE 100 MG PO TABS: 100.0000 mg | ORAL_TABLET | Freq: Every day | ORAL | 0 refills | Status: DC

## 2020-08-24 ENCOUNTER — Telehealth: Payer: Self-pay

## 2020-08-24 ENCOUNTER — Telehealth: Payer: Medicare Other

## 2020-08-24 NOTE — Telephone Encounter (Signed)
  Chronic Care Management   Outreach Note  08/24/2020 Name: Manuel [name] MRN: [id number] DOB: [birth year]  Referred by: Dorothyann Peng, MD Reason for referral : Chronic Care Management (Unsuccessful call)   An unsuccessful telephone outreach was attempted today. The patient was referred to the case management team for assistance with care management and care coordination.   Follow Up Plan: A HIPAA compliant phone message was left for the patient providing contact information and requesting a return call.  The care management team will reach out to the patient again over the next 21 days.   Bevelyn Ngo, BSW, CDP Social Worker, Certified Dementia Practitioner [facility] Care Management [phone]

## 2020-08-25 ENCOUNTER — Telehealth: Payer: Medicare Other

## 2020-08-25 ENCOUNTER — Ambulatory Visit (INDEPENDENT_AMBULATORY_CARE_PROVIDER_SITE_OTHER): Payer: Medicare Other

## 2020-08-25 DIAGNOSIS — F0391 Unspecified dementia with behavioral disturbance: Secondary | ICD-10-CM | POA: Diagnosis not present

## 2020-08-25 DIAGNOSIS — I10 Essential (primary) hypertension: Secondary | ICD-10-CM

## 2020-08-25 DIAGNOSIS — I129 Hypertensive chronic kidney disease with stage 1 through stage 4 chronic kidney disease, or unspecified chronic kidney disease: Secondary | ICD-10-CM | POA: Diagnosis not present

## 2020-08-25 NOTE — Patient Instructions (Signed)
Goals Addressed    . COMPLETED: Caregiver Coping Optimized       Timeframe:  Long-Range Goal Priority:  High Start Date: 07/07/20                            Expected End Date: 01/07/21   Next Follow Up date: 08/25/20  Patient Goals/Self-Care Activities:   . Discussed caregiver concerns with PCP and embedded BSW at next scheduled visits as discussed . Establish a long term plan of care after speaking with the patient's healthcare team and consider all options to help improve patient's quality of life and decrease caregiver stress . Continue to contact the CCM team and or PCP for questions or concerns                          . COMPLETED: Dementia - Harm or Injury prevented       Timeframe:  Long-Range Goal Priority:  High Start Date: 07/07/20                            Expected End Date: 12/07/20  Next follow up date: 08/25/20  Patient Goals/Self-Care Activities:     . Discuss home safety concerns and worsening dementia with PCP at next visit . Collaborate with embedded BSW for Dementia related resources and Care Coordination needs . Call the CCM team and or PCP for questions or concerns . Keep all scheduled MD follow up appointments                      . COMPLETED: Effective Long Term Care Planning       Timeframe:  Long-Range Goal Priority:  High Start Date:  3.18.22                           Expected End Date: 7.16.22                       Next planned outreach: 5.2.22  Patient Goals/Self-Care Activities . Over the next 21 days, patient will: With the help of his sister - Patient will self administer medications as prescribed Patient will attend all scheduled provider appointments Review Mailed resources Engage with [facility] SW to complete admission application Contact SW as needed prior to next scheduled call    . COMPLETED: Injury related to Fall minimized or prevented       Timeframe:  Long-Range Goal Priority:  High Start Date:  06/30/20                            Expected End Date: 12/31/20  Next Follow Up Date: 08/25/20  - Utilize rolling walker and rollator (assistive device) appropriately with all ambulation - De-clutter walkways - Change positions slowly - Wear secure fitting shoes at all times with ambulation - Utilize home lighting for dim lit areas - Demonstrate self and pet awareness at all times                         . COMPLETED: Sleep through the night       Timeframe:  Short-Term Goal Priority:  High Start Date:    1.4.22  Expected End Date: 3.5.22                       Next planned outreach: 2.4.22  Patient Goals/Self-Care Activities Over the next 20 days, patient will:   - Patient will self administer medications as prescribed Patient will attend all scheduled provider appointments Patient will call provider office for new concerns or questions Contact SW as needed prior to next scheduled call    . COMPLETED: Work with SW to manage care coordination needs       Timeframe:  Long-Range Goal Priority:  Low Start Date:   12.10.21                          Expected End Date: 3.10.22                   Next Expected date of contact: 3.28.22  Patient Goals/Self-Care Activities Over the next 60 days, patient will: With the help of his sister and niece  - Patient will self administer medications as prescribed Patient will attend all scheduled provider appointments Patient will call pharmacy for medication refills Patient will call provider office for new concerns or questions Contact SW as needed prior to next scheduled call Order a handheld shower under OTC benefit Engage with PT

## 2020-08-25 NOTE — Chronic Care Management (AMB) (Signed)
Chronic Care Management   CCM RN Visit Note  08/25/2020 Name: Manuel [name] MRN: [id number] DOB: 1932-11-22  Subjective: Manuel [name] is a 87 y.o. year old male who is a primary care patient of [name], MD. The care management team was consulted for assistance with disease management and care coordination needs.    Engaged with patient by telephone for follow up visit in response to provider referral for case management and/or care coordination services.   Consent to Services:  The patient was given information about Chronic Care Management services, agreed to services, and gave verbal consent prior to initiation of services.  Please see initial visit note for detailed documentation.   Patient agreed to services and verbal consent obtained.   Assessment: Review of patient past medical history, allergies, medications, health status, including review of consultants reports, laboratory and other test data, was performed as part of comprehensive evaluation and provision of chronic care management services.   SDOH (Social Determinants of Health) assessments and interventions performed:    CCM Care Plan  Allergies  Allergen Reactions  . Penicillins Swelling    Did it involve swelling of the face/tongue/throat, SOB, or low BP? Yes Did it involve sudden or severe rash/hives, skin peeling, or any reaction on the inside of your mouth or nose? No Did you need to seek medical attention at a hospital or doctor's office? No When did it last happen?2013 If all above answers are "NO", may proceed with cephalosporin use.     Outpatient Encounter Medications as of 08/25/2020  Medication Sig  . acetaminophen (TYLENOL) 650 MG CR tablet Take 650 mg by mouth every 8 (eight) hours as needed for pain.  [name] amLODipine (NORVASC) 5 MG tablet TAKE 1 TABLET BY MOUTH EVERY DAY  . Ascorbic Acid (VITAMIN C PO) Take 100 mg by mouth daily.  [name] aspirin EC 81 MG tablet Take 81 mg by mouth every  evening.   . Calcium Carbonate Antacid (CALCIUM CARBONATE PO) Take 100 mg by mouth. 3 x weekly  . cholecalciferol (VITAMIN D3) 25 MCG (1000 UT) tablet Take 2,000 Units by mouth every evening.  . diphenhydrAMINE HCl, Sleep, (ZZZQUIL PO) Take 1 tablet by mouth at bedtime as needed. The suggested dosing on the box  . LORazepam (ATIVAN) 0.5 MG tablet One tab po qd prn  . losartan (COZAAR) 100 MG tablet Take 100 mg by mouth daily. THN remote  . memantine (NAMENDA) 10 MG tablet Take 1 tablet (10 mg total) by mouth 2 (two) times daily.  . QUEtiapine (SEROQUEL) 100 MG tablet Take 1 tablet (100 mg total) by mouth at bedtime. THN Remote  . tamsulosin (FLOMAX) 0.4 MG CAPS capsule TAKE 1 CAPSULE BY MOUTH EVERY DAY  . traMADol (ULTRAM) 50 MG tablet Take 50 mg by mouth every 8 (eight) hours as needed.  . vitamin E 200 UNIT capsule Take 200 Units by mouth every evening.   No facility-administered encounter medications on file as of 08/25/2020.    Patient Active Problem List   Diagnosis Date Noted  . Moderate dementia with behavioral disturbance ([place]) 11/26/2018  . FUO (fever of unknown origin) 06/24/2018  . Essential hypertension, benign 06/06/2018  . Other abnormal glucose 06/06/2018  . Pure hypercholesterolemia 06/06/2018    Conditions to be addressed/monitored:Essential Hypertension, Dementia, Parenchymal renal hypertension, stage 1 through 4, CKD stage 3    Care Plan : Social Work Care Plan  Updates made by Lynne Logan, RN since 08/25/2020 12:00 AM  Completed  08/25/2020  Problem: Care Coordination Resolved 08/25/2020  Priority: Low    Long-Range Goal: Collaborate with RN Care Manager to perform appropriate assessments to assist with care coordination needs Completed 08/25/2020  Start Date: 04/03/2020  Expected End Date: 07/02/2020  Recent Progress: On track  Priority: Low  Note:   Current Barriers:  . ADL IADL limitations . Memory Deficits . Chronic conditions including HTN, Dementia, and CKD  III which put patient at increased risk of hospitaliation . Unable to self administer medications as prescribed  Social Work Clinical Goal(s):  [name] Over the next 30 days the patient will work with [facility] OT as ordered by his primary care physician Goal not met due to staffing challenges . Over the next 120 days the patient will work with SW to address care coordination needs  CCM RN Interventions: . 08/25/20 completed outbound call to niece [name] . Determined [facility] . Discussed today patient is scheduled to meet the providers and the therapists who will be available to provide him care . Discussed patient will be closed from CCM at this time  . Sent notification to embedded [place] with an update  Patient Goals/Self-Care Activities Over the next 60 days, patient will: With the help of his sister and niece  - Patient will self administer medications as prescribed Patient will attend all scheduled provider appointments Patient will call pharmacy for medication refills Patient will call provider office for new concerns or questions Contact SW as needed prior to next scheduled call Order a handheld shower under OTC benefit Engage with PT  Follow up Plan: SW will follow up with patient by phone over the next 60 days    Problem: Long-Term Care Planning Resolved 08/25/2020    Long-Range Goal: Effective Long-Term Care Planning Completed 08/25/2020  Start Date: 07/10/2020  Expected End Date: 11/07/2020  Recent Progress: On track  Priority: High  Note:   Late entry from 4.4.22 Current Barriers:  . Chronic disease management support and education needs related to HTN, CKD Stage III, and Dementia  . ADL IADL limitations . Interrupted sleep pattern  Social Worker Clinical Goal(s):  [name] Over the next 120 days, patients family will work with SW to identify long term care plans to meet the patients needs  CCM RN Interventions:   . Inter-disciplinary care team collaboration (see longitudinal plan of care) . Collaboration with [name], MD regarding development and update of comprehensive plan of care as evidenced by provider attestation and co-signature . 08/25/20 completed outbound call to niece [name] . Determined [facility] is [facility] with [facility] . Discussed today patient is scheduled to meet the providers and the therapists who will be available to provide him care . Discussed patient will be closed from CCM at this time  . Sent notification to embedded [place] with an update  Patient Goals/Self-Care Activities . Over the next 21 days, patient will: With the help of his sister and niece  - Patient will self administer medications as prescribed Patient will attend all scheduled provider appointments Review Mailed resources Engage with [facility] SW to complete admission application Contact SW as needed prior to next scheduled call  Follow Up Plan:  SW will follow up with the patient and his caregiver over the next month    Care Plan : Fall Risk (Adult)  Updates made by Lynne Logan, RN since 08/25/2020 12:00 AM  Completed 08/25/2020  Problem: Fall Risk Resolved 08/25/2020  Priority: High    Long-Range Goal: Absence of Fall and Fall-Related Injury Completed 08/25/2020  Start Date: 06/30/2020  Expected End Date: 12/31/2020  Recent Progress: On track  Priority: High  Note:   Current Barriers:  [name] Knowledge Deficits related to fall precautions in patient with Dementia . Decreased adherence to prescribed treatment for fall prevention . Unable to self administer medications as prescribed . Unable to perform ADLs independently . Unable to perform IADLs independently . Chronic Disease Management support and education needs related to Essential Hypertension, Dementia, Parenchymal renal hypertension, stage 1 through 4, CKD stage 3   . Cognitive Deficits Clinical Goal(s):   [name] Patient/caregiver will demonstrate improved adherence to prescribed treatment plan for decreasing falls as evidenced by patient reporting and review of EMR . Patient/caregiver will verbalize using fall risk reduction strategies discussed . Patient/caregiver will not experience additional falls . Patient/caregiver will verbalize understanding of plan for fall prevention plan Interventions:  . Collaboration with [name], MD regarding development and update of comprehensive plan of care as evidenced by provider attestation and co-signature . Inter-disciplinary care team collaboration (see longitudinal plan of care) . 08/25/20 completed outbound call to niece [name] . Determined [facility] is [facility] with [facility] . Discussed today patient is scheduled to meet the providers and the therapists who will be available to provide him care . Discussed patient will be closed from CCM at this time  . Sent notification to embedded [place] with an update Patient Goals/Self-Care Activities:  - Utilize rolling walker and rollator (assistive device) appropriately with all ambulation - De-clutter walkways - Change positions slowly - Wear secure fitting shoes at all times with ambulation - Utilize home lighting for dim lit areas - Demonstrate self and pet awareness at all times Follow Up Plan: Telephone follow up appointment with care management team member scheduled for: 08/25/20    Care Plan : Dementia (Adult)  Updates made by Lynne Logan, RN since 08/25/2020 12:00 AM  Completed 08/25/2020  Problem: Caregiver Stress Resolved 08/25/2020  Priority: High    Long-Range Goal: Caregiver Coping Optimized Completed 08/25/2020  Start Date: 07/07/2020  Expected End Date: 12/07/2020  Recent Progress: On track  Priority: High  Note:   Current Barriers:   Ineffective Self Health Maintenance  Unable to self administer medications as prescribed  Unable to perform ADLs  independently  Unable to perform IADLs independently Clinical Goal(s):  [name] Collaboration with [name], MD regarding development and update of comprehensive plan of care as evidenced by provider attestation and co-signature . Inter-disciplinary care team collaboration (see longitudinal plan of care)  patient will work with care management team to address care coordination and chronic disease management needs related to Disease Management  Educational Needs  Care Coordination  Medication Management and Education  Medication Reconciliation  Psychosocial Support  Dementia and Caregiver Support   Interventions:   Evaluation of current treatment plan related to Dementia and Caregiver Stress, and patient's adherence to plan as established by provider.  Collaboration with [name], MD regarding development and update of comprehensive plan of care as evidenced by provider attestation       and co-signature  Inter-disciplinary care team collaboration (see longitudinal plan of care)  08/25/20 completed outbound call to niece [name] . Determined [facility] is newly [facility] with [facility] . Discussed today patient is scheduled to meet the providers and the therapists who will be available to provide  him care . Discussed patient will be closed from CCM at this time  . Sent notification to embedded [place] with an update Self Care Activities:  . Discussed caregiver concerns with PCP and embedded BSW at next scheduled visits as discussed . Establish a long term plan of care after speaking with the patient's healthcare team and consider all options to help improve patient's quality of life and decrease caregiver stress . Continue to contact the CCM team and or PCP for questions or concerns  Patient Goals: - to keep him safe at home and or consider having him placed in a skilled nursing facility   Follow Up Plan: Telephone follow up appointment with care  management team member scheduled for: 08/25/20   Problem: Harm or Injury Resolved 08/25/2020  Priority: High    Long-Range Goal: Harm or Injury Prevented Completed 08/25/2020  Start Date: 07/07/2020  Expected End Date: 12/07/2020  Recent Progress: Not on track  Priority: High  Note:   Current Barriers:   Ineffective Self Health Maintenance  Unable to self administer medications as prescribed  Lacks social connections  Unable to perform ADLs independently  Unable to perform IADLs independently Clinical Goal(s):  [name] Collaboration with [name], MD regarding development and update of comprehensive plan of care as evidenced by provider attestation and co-signature . Inter-disciplinary care team collaboration (see longitudinal plan of care)  patient will work with care management team to address care coordination and chronic disease management needs related to Disease Management  Educational Needs  Care Coordination  Medication Management and Education  Psychosocial Support  Dementia and Caregiver Support   Interventions:   Evaluation of current treatment plan related to Dementia and patient's adherence to plan as established by provider.  Collaboration with [name], MD regarding development and update of comprehensive plan of care as evidenced by provider attestation       and co-signature  Inter-disciplinary care team collaboration (see longitudinal plan of care) 08/25/20 completed outbound call to niece [name] . Determined [facility] is [facility] with [facility] . Discussed today patient is scheduled to meet the providers and the therapists who will be available to provide him care . Discussed patient will be closed from CCM at this time  . Sent notification to embedded [place] with an update Self Care Activities:  . Discuss home safety concerns and worsening dementia with PCP at next visit . Collaborate with embedded BSW for Dementia  related resources and Care Coordination needs . Call the CCM team and or PCP for questions or concerns . Keep all scheduled MD follow up appointments  Patient Goals: - to keep him safe at home   Next Follow Up Date: 08/25/20    Plan: No further follow up required  Barb Merino, RN, BSN, CCM Care Management Coordinator [facility]  Direct Phone: [phone]

## 2020-09-04 ENCOUNTER — Telehealth: Payer: Medicare Other

## 2020-11-13 ENCOUNTER — Other Ambulatory Visit: Payer: Self-pay | Admitting: Internal Medicine

## 2020-11-16 ENCOUNTER — Other Ambulatory Visit: Payer: Self-pay | Admitting: Internal Medicine

## 2020-11-16 ENCOUNTER — Ambulatory Visit: Payer: Medicare Other | Admitting: Internal Medicine

## 2020-11-16 ENCOUNTER — Telehealth: Payer: Self-pay

## 2020-11-16 NOTE — Telephone Encounter (Signed)
I called to confirm that the pt is currently a patient of [facility] and no longer a pt [facility] and I was told yes, I notified Ms. Pickard that I would cancel all upcoming appt.s

## 2020-11-17 ENCOUNTER — Ambulatory Visit: Payer: Medicare Other | Admitting: Internal Medicine

## 2021-01-14 ENCOUNTER — Encounter: Payer: Medicare Other | Admitting: Internal Medicine

## 2022-12-23 ENCOUNTER — Encounter (HOSPITAL_COMMUNITY): Payer: Self-pay

## 2022-12-23 ENCOUNTER — Inpatient Hospital Stay (HOSPITAL_COMMUNITY)
Admission: EM | Admit: 2022-12-23 | Discharge: 2023-01-24 | DRG: 871 | Disposition: E | Payer: Medicare (Managed Care) | Source: Skilled Nursing Facility | Attending: Internal Medicine | Admitting: Internal Medicine

## 2022-12-23 ENCOUNTER — Emergency Department (HOSPITAL_COMMUNITY): Payer: Medicare (Managed Care)

## 2022-12-23 ENCOUNTER — Other Ambulatory Visit: Payer: Self-pay

## 2022-12-23 DIAGNOSIS — E872 Acidosis, unspecified: Secondary | ICD-10-CM | POA: Diagnosis present

## 2022-12-23 DIAGNOSIS — A419 Sepsis, unspecified organism: Principal | ICD-10-CM | POA: Diagnosis present

## 2022-12-23 DIAGNOSIS — G8929 Other chronic pain: Secondary | ICD-10-CM | POA: Diagnosis present

## 2022-12-23 DIAGNOSIS — J9601 Acute respiratory failure with hypoxia: Secondary | ICD-10-CM | POA: Diagnosis present

## 2022-12-23 DIAGNOSIS — F02B4 Dementia in other diseases classified elsewhere, moderate, with anxiety: Secondary | ICD-10-CM | POA: Diagnosis present

## 2022-12-23 DIAGNOSIS — K219 Gastro-esophageal reflux disease without esophagitis: Secondary | ICD-10-CM | POA: Diagnosis present

## 2022-12-23 DIAGNOSIS — R6520 Severe sepsis without septic shock: Secondary | ICD-10-CM | POA: Diagnosis present

## 2022-12-23 DIAGNOSIS — E7800 Pure hypercholesterolemia, unspecified: Secondary | ICD-10-CM | POA: Diagnosis present

## 2022-12-23 DIAGNOSIS — G9340 Encephalopathy, unspecified: Secondary | ICD-10-CM | POA: Diagnosis present

## 2022-12-23 DIAGNOSIS — Z66 Do not resuscitate: Secondary | ICD-10-CM | POA: Diagnosis present

## 2022-12-23 DIAGNOSIS — Z79899 Other long term (current) drug therapy: Secondary | ICD-10-CM

## 2022-12-23 DIAGNOSIS — Z515 Encounter for palliative care: Secondary | ICD-10-CM

## 2022-12-23 DIAGNOSIS — F03B18 Unspecified dementia, moderate, with other behavioral disturbance: Secondary | ICD-10-CM | POA: Diagnosis present

## 2022-12-23 DIAGNOSIS — F02B18 Dementia in other diseases classified elsewhere, moderate, with other behavioral disturbance: Secondary | ICD-10-CM | POA: Diagnosis present

## 2022-12-23 DIAGNOSIS — K5900 Constipation, unspecified: Secondary | ICD-10-CM | POA: Diagnosis present

## 2022-12-23 DIAGNOSIS — R4182 Altered mental status, unspecified: Secondary | ICD-10-CM

## 2022-12-23 DIAGNOSIS — I10 Essential (primary) hypertension: Secondary | ICD-10-CM | POA: Diagnosis present

## 2022-12-23 DIAGNOSIS — Z1152 Encounter for screening for COVID-19: Secondary | ICD-10-CM

## 2022-12-23 DIAGNOSIS — J189 Pneumonia, unspecified organism: Secondary | ICD-10-CM | POA: Diagnosis not present

## 2022-12-23 DIAGNOSIS — G309 Alzheimer's disease, unspecified: Secondary | ICD-10-CM | POA: Diagnosis present

## 2022-12-23 DIAGNOSIS — N3281 Overactive bladder: Secondary | ICD-10-CM | POA: Diagnosis present

## 2022-12-23 LAB — CBC WITH DIFFERENTIAL/PLATELET
Abs Immature Granulocytes: 0.04 10*3/uL (ref 0.00–0.07)
Basophils Absolute: 0 10*3/uL (ref 0.0–0.1)
Basophils Relative: 0 %
Eosinophils Absolute: 0 10*3/uL (ref 0.0–0.5)
Eosinophils Relative: 0 %
HCT: 39.8 % (ref 39.0–52.0)
Hemoglobin: 12.9 g/dL — ABNORMAL LOW (ref 13.0–17.0)
Immature Granulocytes: 1 %
Lymphocytes Relative: 13 %
Lymphs Abs: 1 10*3/uL (ref 0.7–4.0)
MCH: 31.1 pg (ref 26.0–34.0)
MCHC: 32.4 g/dL (ref 30.0–36.0)
MCV: 95.9 fL (ref 80.0–100.0)
Monocytes Absolute: 0.8 10*3/uL (ref 0.1–1.0)
Monocytes Relative: 10 %
Neutro Abs: 5.8 10*3/uL (ref 1.7–7.7)
Neutrophils Relative %: 76 %
Platelets: 170 10*3/uL (ref 150–400)
RBC: 4.15 MIL/uL — ABNORMAL LOW (ref 4.22–5.81)
RDW: 15.3 % (ref 11.5–15.5)
WBC: 7.6 10*3/uL (ref 4.0–10.5)
nRBC: 0 % (ref 0.0–0.2)

## 2022-12-23 LAB — COMPREHENSIVE METABOLIC PANEL
ALT: 10 U/L (ref 0–44)
AST: 15 U/L (ref 15–41)
Albumin: <1.5 g/dL — ABNORMAL LOW (ref 3.5–5.0)
Alkaline Phosphatase: 41 U/L (ref 38–126)
Anion gap: 14 (ref 5–15)
BUN: 25 mg/dL — ABNORMAL HIGH (ref 8–23)
CO2: 16 mmol/L — ABNORMAL LOW (ref 22–32)
Calcium: 7.3 mg/dL — ABNORMAL LOW (ref 8.9–10.3)
Chloride: 104 mmol/L (ref 98–111)
Creatinine, Ser: 0.82 mg/dL (ref 0.61–1.24)
GFR, Estimated: >60 mL/min (ref >60)
Glucose, Bld: 61 mg/dL — ABNORMAL LOW (ref 70–99)
Potassium: 3.7 mmol/L (ref 3.5–5.1)
Sodium: 134 mmol/L — ABNORMAL LOW (ref 135–145)
Total Bilirubin: 0.8 mg/dL (ref 0.3–1.2)
Total Protein: 3.9 g/dL — ABNORMAL LOW (ref 6.5–8.1)

## 2022-12-23 LAB — I-STAT VENOUS BLOOD GAS, ED
Acid-Base Excess: 2 mmol/L (ref 0.0–2.0)
Bicarbonate: 27.6 mmol/L (ref 20.0–28.0)
Calcium, Ion: 1.18 mmol/L (ref 1.15–1.40)
HCT: 41 % (ref 39.0–52.0)
Hemoglobin: 13.9 g/dL (ref 13.0–17.0)
O2 Saturation: 57 %
Potassium: 3.9 mmol/L (ref 3.5–5.1)
Sodium: 142 mmol/L (ref 135–145)
TCO2: 29 mmol/L (ref 22–32)
pCO2, Ven: 46.8 mmHg (ref 44–60)
pH, Ven: 7.379 (ref 7.25–7.43)
pO2, Ven: 31 mmHg — CL (ref 32–45)

## 2022-12-23 LAB — I-STAT CG4 LACTIC ACID, ED
Lactic Acid, Venous: 1.6 mmol/L (ref 0.5–1.9)
Lactic Acid, Venous: 2.2 mmol/L (ref 0.5–1.9)

## 2022-12-23 LAB — PROTIME-INR
INR: 1.7 — ABNORMAL HIGH (ref 0.8–1.2)
Prothrombin Time: 20.1 seconds — ABNORMAL HIGH (ref 11.4–15.2)

## 2022-12-23 LAB — APTT: aPTT: 44 seconds — ABNORMAL HIGH (ref 24–36)

## 2022-12-23 LAB — MRSA NEXT GEN BY PCR, NASAL: MRSA by PCR Next Gen: NOT DETECTED

## 2022-12-23 LAB — RESP PANEL BY RT-PCR (RSV, FLU A&B, COVID)  RVPGX2
Influenza A by PCR: NEGATIVE
Influenza B by PCR: NEGATIVE
Resp Syncytial Virus by PCR: NEGATIVE
SARS Coronavirus 2 by RT PCR: NEGATIVE

## 2022-12-23 LAB — PROCALCITONIN: Procalcitonin: 3.12 ng/mL

## 2022-12-23 MED ORDER — ACETAMINOPHEN 500 MG PO TABS: 1000.0000 mg | ORAL_TABLET | Freq: Once | ORAL | Status: DC

## 2022-12-23 MED ORDER — BISACODYL 10 MG RE SUPP: 10.0000 mg | RECTAL | Status: DC

## 2022-12-23 MED ORDER — LEVOFLOXACIN IN D5W 750 MG/150ML IV SOLN: 750.0000 mg | Freq: Once | INTRAVENOUS | Status: DC

## 2022-12-23 MED ORDER — AMLODIPINE BESYLATE 5 MG PO TABS
5.0000 mg | ORAL_TABLET | Freq: Every day | ORAL | Status: DC
  Administered 2022-12-27: 5 mg via ORAL
  Filled 2022-12-23: qty 1

## 2022-12-23 MED ORDER — ACETAMINOPHEN 500 MG PO TABS
1000.0000 mg | ORAL_TABLET | Freq: Three times a day (TID) | ORAL | Status: DC
  Administered 2022-12-24: 1000 mg via ORAL
  Filled 2022-12-23 (×2): qty 2

## 2022-12-23 MED ORDER — FAMOTIDINE 20 MG PO TABS: 20.0000 mg | ORAL_TABLET | Freq: Every day | ORAL | Status: DC | PRN

## 2022-12-23 MED ORDER — SALINE SPRAY 0.65 % NA SOLN: 2.0000 | Freq: Four times a day (QID) | NASAL | Status: DC | PRN

## 2022-12-23 MED ORDER — VITAMIN B-12 1000 MCG PO TABS
500.0000 ug | ORAL_TABLET | Freq: Every day | ORAL | Status: DC
  Administered 2022-12-27: 500 ug via ORAL
  Filled 2022-12-23: qty 1
  Filled 2022-12-23 (×3): qty 5

## 2022-12-23 MED ORDER — SODIUM CHLORIDE 0.9 % IV SOLN: 2.0000 g | Freq: Once | INTRAVENOUS | Status: DC

## 2022-12-23 MED ORDER — DIVALPROEX SODIUM 125 MG PO DR TAB
125.0000 mg | DELAYED_RELEASE_TABLET | Freq: Three times a day (TID) | ORAL | Status: DC
  Administered 2022-12-24 – 2022-12-27 (×3): 125 mg via ORAL
  Filled 2022-12-23 (×13): qty 1

## 2022-12-23 MED ORDER — TAMSULOSIN HCL 0.4 MG PO CAPS
0.4000 mg | ORAL_CAPSULE | Freq: Every day | ORAL | Status: DC
  Administered 2022-12-27: 0.4 mg via ORAL
  Filled 2022-12-23: qty 1

## 2022-12-23 MED ORDER — ENOXAPARIN SODIUM 40 MG/0.4ML IJ SOSY
40.0000 mg | PREFILLED_SYRINGE | INTRAMUSCULAR | Status: DC
  Administered 2022-12-23 – 2022-12-25 (×3): 40 mg via SUBCUTANEOUS
  Filled 2022-12-23 (×3): qty 0.4

## 2022-12-23 MED ORDER — BISACODYL 10 MG RE SUPP: 10.0000 mg | Freq: Every day | RECTAL | Status: DC | PRN

## 2022-12-23 MED ORDER — MIRABEGRON ER 25 MG PO TB24
25.0000 mg | ORAL_TABLET | Freq: Every day | ORAL | Status: DC
  Administered 2022-12-24 – 2022-12-27 (×2): 25 mg via ORAL
  Filled 2022-12-23 (×6): qty 1

## 2022-12-23 MED ORDER — DICLOFENAC SODIUM 1 % EX GEL: 2.0000 g | Freq: Four times a day (QID) | CUTANEOUS | Status: DC | PRN

## 2022-12-23 MED ORDER — MEMANTINE HCL 10 MG PO TABS
10.0000 mg | ORAL_TABLET | Freq: Two times a day (BID) | ORAL | Status: DC
  Administered 2022-12-24 – 2022-12-27 (×2): 10 mg via ORAL
  Filled 2022-12-23 (×10): qty 1

## 2022-12-23 MED ORDER — LOSARTAN POTASSIUM 50 MG PO TABS
100.0000 mg | ORAL_TABLET | Freq: Every day | ORAL | Status: DC
  Administered 2022-12-27: 100 mg via ORAL
  Filled 2022-12-23: qty 2

## 2022-12-23 MED ORDER — VITAMIN D 25 MCG (1000 UNIT) PO TABS
2000.0000 [IU] | ORAL_TABLET | Freq: Every day | ORAL | Status: DC
  Administered 2022-12-27: 2000 [IU] via ORAL
  Filled 2022-12-23: qty 2

## 2022-12-23 MED ORDER — SODIUM CHLORIDE 0.9 % IV SOLN
500.0000 mg | INTRAVENOUS | Status: DC
  Administered 2022-12-23 – 2022-12-25 (×3): 500 mg via INTRAVENOUS
  Filled 2022-12-23 (×5): qty 5

## 2022-12-23 MED ORDER — TRAMADOL HCL 50 MG PO TABS
50.0000 mg | ORAL_TABLET | Freq: Two times a day (BID) | ORAL | Status: DC | PRN
  Administered 2022-12-24: 50 mg via ORAL
  Filled 2022-12-23: qty 1

## 2022-12-23 MED ORDER — FERROUS SULFATE 325 (65 FE) MG PO TABS
325.0000 mg | ORAL_TABLET | ORAL | Status: DC
  Filled 2022-12-23: qty 1

## 2022-12-23 MED ORDER — LACTATED RINGERS IV BOLUS (SEPSIS)
1000.0000 mL | Freq: Once | INTRAVENOUS | Status: AC
  Administered 2022-12-23: 1000 mL via INTRAVENOUS

## 2022-12-23 MED ORDER — LACTATED RINGERS IV SOLN: INTRAVENOUS | Status: DC

## 2022-12-23 MED ORDER — SENNOSIDES-DOCUSATE SODIUM 8.6-50 MG PO TABS
2.0000 | ORAL_TABLET | Freq: Every day | ORAL | Status: DC
  Administered 2022-12-24: 2 via ORAL
  Filled 2022-12-23: qty 2

## 2022-12-23 MED ORDER — ACETAMINOPHEN 650 MG RE SUPP
650.0000 mg | Freq: Once | RECTAL | Status: AC
  Administered 2022-12-23: 650 mg via RECTAL
  Filled 2022-12-23: qty 1

## 2022-12-23 MED ORDER — SODIUM CHLORIDE 0.9 % IV SOLN
2.0000 g | INTRAVENOUS | Status: DC
  Administered 2022-12-23 – 2022-12-26 (×4): 2 g via INTRAVENOUS
  Filled 2022-12-23 (×5): qty 20

## 2022-12-23 NOTE — ED Triage Notes (Signed)
Patient arrives by EMS from [facility] with c/o low oxygen saturations 85-88%, patient at baseline is not oxygen dependent.  Staff also reported patient altered, and noted to be drooling on left side.

## 2022-12-23 NOTE — ED Provider Notes (Signed)
[place] EMERGENCY DEPARTMENT AT [facility] Provider Note   CSN: [id number] Arrival date & time: 11/24/2022  1015     History  No chief complaint on file.   Manuel [name] is a 89 y.o. male.  The history is provided by the nursing home, the EMS personnel, a relative and medical records. No language interpreter was used.     89 year old male significant history of hypertension, dementia, prostate disorder, brought here via EMS for evaluation of altered mental status.  History obtained through family member who is at bedside.  Family member was notified that patient was having fever, as well as hypoxia and more confusion prompting ER visit.  Family member saw patient yesterday and patient was mostly in bed but without any specific complaint.  Patient normally not on supplemental oxygen but was documented to have an O2 sats of 85-88% at the facility.  Was report the patient was drooling on the left side however I remember mention patient normally laying on his left side at baseline.  No report of any runny nose sneezing or coughing that they have noted and no report of vomiting or diarrhea.  States that patient behaves similarly like this in the past when he had a urinary tract infection.  He is a DNR/DNI.  Home Medications Prior to Admission medications   Medication Sig Start Date End Date Taking? Authorizing Provider  acetaminophen (TYLENOL) 650 MG CR tablet Take 650 mg by mouth every 8 (eight) hours as needed for pain.    [provider]  amLODipine (NORVASC) 5 MG tablet TAKE 1 TABLET BY MOUTH EVERY DAY 06/01/20   Dorothyann Peng, MD  Ascorbic Acid (VITAMIN C PO) Take 100 mg by mouth daily. 06/17/19   [provider]  aspirin EC 81 MG tablet Take 81 mg by mouth every evening.     [provider]  Calcium Carbonate Antacid (CALCIUM CARBONATE PO) Take 100 mg by mouth. 3 x weekly    [provider]  cholecalciferol (VITAMIN D3) 25 MCG (1000 UT)  tablet Take 2,000 Units by mouth every evening.    [provider]  diphenhydrAMINE HCl, Sleep, (ZZZQUIL PO) Take 1 tablet by mouth at bedtime as needed. The suggested dosing on the box    [provider]  LORazepam (ATIVAN) 0.5 MG tablet One tab po qd prn 08/20/20   Dorothyann Peng, MD  losartan (COZAAR) 100 MG tablet Take 100 mg by mouth daily. THN remote    [provider]  memantine (NAMENDA) 10 MG tablet Take 1 tablet (10 mg total) by mouth 2 (two) times daily. 06/08/20   Penumalli, Glenford Bayley, MD  QUEtiapine (SEROQUEL) 100 MG tablet TAKE 1 TABLET (100 MG TOTAL) BY MOUTH AT BEDTIME. THN REMOTE 11/18/20   Dorothyann Peng, MD  tamsulosin (FLOMAX) 0.4 MG CAPS capsule TAKE 1 CAPSULE BY MOUTH EVERY DAY 04/10/20   Dorothyann Peng, MD  traMADol (ULTRAM) 50 MG tablet Take 50 mg by mouth every 8 (eight) hours as needed. 05/21/20   [provider]  vitamin E 200 UNIT capsule Take 200 Units by mouth every evening.    [provider]      Allergies    Penicillins    Review of Systems   Review of Systems  Unable to perform ROS: Mental status change    Physical Exam Updated Vital Signs BP (!) 156/93   Pulse (!) 103   Temp (!) 101 F (38.3 C) (Axillary)   Resp [name])  33   SpO2 96%  Physical Exam Vitals and nursing note reviewed.  Constitutional:      Appearance: He is well-developed.     Comments: Chronically ill-appearing male laying on a left lateral decubitus position mildly tachypneic.  HENT:     Head: Atraumatic.  Eyes:     Extraocular Movements: Extraocular movements intact.     Conjunctiva/sclera: Conjunctivae normal.     Pupils: Pupils are equal, round, and reactive to light.  Cardiovascular:     Rate and Rhythm: Tachycardia present.  Pulmonary:     Comments: Decreased breath sounds without overt wheezes rales or rhonchi Abdominal:     Palpations: Abdomen is soft.  Musculoskeletal:     Cervical back: Normal range of motion and neck supple.  No rigidity.     Comments: Able to follow commands and move all 4 extremities  Skin:    General: Skin is warm.     Findings: No rash.  Neurological:     Mental Status: He is disoriented.     ED Results / Procedures / Treatments   Labs (all labs ordered are listed, but only abnormal results are displayed) Labs Reviewed  CBC WITH DIFFERENTIAL/PLATELET - Abnormal; Notable for the following components:      Result Value   RBC 4.15 (*)    Hemoglobin 12.9 (*)    All other components within normal limits  PROTIME-INR - Abnormal; Notable for the following components:   Prothrombin Time 20.1 (*)    INR 1.7 (*)    All other components within normal limits  APTT - Abnormal; Notable for the following components:   aPTT 44 (*)    All other components within normal limits  I-STAT VENOUS BLOOD GAS, ED - Abnormal; Notable for the following components:   pO2, Ven 31 (*)    All other components within normal limits  I-STAT CG4 LACTIC ACID, ED - Abnormal; Notable for the following components:   Lactic Acid, Venous 2.2 (*)    All other components within normal limits  RESP PANEL BY RT-PCR (RSV, FLU A&B, COVID)  RVPGX2  CULTURE, BLOOD (ROUTINE X 2)  CULTURE, BLOOD (ROUTINE X 2)  URINALYSIS, W/ REFLEX TO CULTURE (INFECTION SUSPECTED)  COMPREHENSIVE METABOLIC PANEL  I-STAT CG4 LACTIC ACID, ED    EKG None ED ECG REPORT   Date: 12/05/2022  Rate: 105  Rhythm: sinus tachycardia  QRS Axis: left  Intervals: normal  ST/T Wave abnormalities: nonspecific ST changes  Conduction Disutrbances:left anterior fascicular block  Narrative Interpretation:   Old EKG Reviewed: unchanged  I have personally reviewed the EKG tracing and agree with the computerized printout as noted.   Radiology DG Chest Port 1 View  Result Date: 12/13/2022 CLINICAL DATA:  Questionable sepsis. Evaluate for abnormality. Low oxygen saturations. EXAM: PORTABLE CHEST 1 VIEW COMPARISON:  06/24/2018 FINDINGS: Airspace  densities at the left lung base and periphery of the left lung. Possible airspace disease in the right infrahilar region. Heart is grossly stable for size but slightly obscured by the left lung densities. Negative for a pneumothorax. No acute bone abnormality. IMPRESSION: 1. Left lung opacities are suggestive for pneumonia. Questionable disease in the right hilar region. Electronically Signed   By: Richarda Overlie M.D.   On: 12/03/2022 11:48    Procedures .Critical Care  Performed by: Fayrene Helper, PA-C Authorized by: Fayrene Helper, PA-C   Critical care provider statement:    Critical care time (minutes):  30   Critical care was time spent personally  by me on the following activities:  Development of treatment plan with patient or surrogate, discussions with consultants, evaluation of patient's response to treatment, examination of patient, ordering and review of laboratory studies, ordering and review of radiographic studies, ordering and performing treatments and interventions, pulse oximetry, re-evaluation of patient's condition and review of old charts     Medications Ordered in ED Medications  lactated ringers infusion (has no administration in time range)  azithromycin (ZITHROMAX) 500 mg in sodium chloride 0.9 % 250 mL IVPB (500 mg Intravenous New Bag/Given 12/14/2022 1230)  cefTRIAXone (ROCEPHIN) 2 g in sodium chloride 0.9 % 100 mL IVPB (0 g Intravenous Stopped 12/01/2022 1141)  lactated ringers bolus 1,000 mL (1,000 mLs Intravenous New Bag/Given 12/17/2022 1112)  acetaminophen (TYLENOL) suppository 650 mg (650 mg Rectal Given 12/18/2022 1117)    ED Course/ Medical Decision Making/ A&P                                 Medical Decision Making Amount and/or Complexity of Data Reviewed Labs: ordered. Radiology: ordered. ECG/medicine tests: ordered.  Risk OTC drugs. Prescription drug management.   BP (!) 154/84 (BP Location: Left Arm)   Pulse (!) 104   Temp (!) 101 F (38.3 C) (Axillary)    Resp (!) 33   SpO2 95%   38:51 AM 89 year old male significant history of hypertension, dementia, prostate disorder, brought here via EMS for evaluation of altered mental status.  History obtained through family member who is at bedside.  Family member was notified that patient was having fever, as well as hypoxia and more confusion prompting ER visit.  Family member saw patient yesterday and patient was mostly in bed but without any specific complaint.  Patient normally not on supplemental oxygen but was documented to have an O2 sats of 85-88% at the facility.  Was report the patient was drooling on the left side however I remember mention patient normally laying on his left side at baseline.  No report of any runny nose sneezing or coughing that they have noted and no report of vomiting or diarrhea.  States that patient behaves similarly like this in the past when he had a urinary tract infection.  He is a DNR/DNI.  On exam this is an elderly male laying on the left lateral decubitus position and is tachypneic.  O2 sat is 95% on supplemental oxygen.  He is tachycardic.  Lungs with decreased breath sounds but no overt wheezes rales or rhonchi.  He is able to follow command but history is difficult to obtain likely in the setting of delirium and having dementia.  Vitals are notable for blood pressure 154/84, or temperature of 101, heart rate of 104, respiratory rate of 33, and O2 sats of 95% on 8 L.  -Labs ordered, independently viewed and interpreted by me.  Labs remarkable for lactic acid of 2.2.  IVF and abx started.  INR 1.7.  -The patient was maintained on a cardiac monitor.  I personally viewed and interpreted the cardiac monitored which showed an underlying rhythm of: sinus tachycardia -Imaging independently viewed and interpreted by me and I agree with radiologist's interpretation.  Result remarkable for CXR showing LLL pna -This patient presents to the ED for concern of AMS, this involves an  extensive number of treatment options, and is a complaint that carries with it a high risk of complications and morbidity.  The differential diagnosis includes pneumonia, covid,  flu, rsv, uti, stroke, MI, sepsis -Co morbidities that complicate the patient evaluation includes HTN, dementia -Treatment includes zithromax, rocephin, IVF -Reevaluation of the patient after these medicines showed that the patient stayed the same -PCP office notes or outside notes reviewed -Discussion with specialist Internal Medicine team who agrees to see and admit pt for sepsis in the setting of pna -Escalation to admission/observation considered: hospital admission         Final Clinical Impression(s) / ED Diagnoses Final diagnoses:  Sepsis with acute hypoxic respiratory failure without septic shock, due to unspecified organism [facility])    Rx / DC Orders ED Discharge Orders     None         Fayrene Helper, PA-C 12/04/2022 1357    Sloan Leiter, DO 12/24/22 [phone]

## 2022-12-23 NOTE — Sepsis Progress Note (Signed)
Sepsis protocol monitored by eLink ?

## 2022-12-23 NOTE — H&P (Signed)
Date: 12/04/2022               Patient Name:  Manuel [name] MRN: [id number]  DOB: 1932/10/27 Age / Sex: 89 y.o., male   PCP: Dorothyann Peng, MD         Medical Service: Internal Medicine Teaching Service         Attending Physician: Dr. Earl Lagos, MD      First Contact: Dr. Katheran James, DO Pager [phone]    Second Contact: Dr. Rocky Morel, DO Pager [phone]         After Hours (After 5p/  First Contact Pager: [phone]  weekends / holidays): Second Contact Pager: [phone]   SUBJECTIVE   Chief Complaint: AMS and hypoxia  History of Present Illness:  Mr Arkan Blood is a 89 yo M with PMH of dementia, chronic low back pain, HTN who presented to [facility] ED with altered mental status and hypoxia.  This morning the patient's caregiver found him somnolent, drooling, and hypoxic to 85% on pulse ox. Hypoxia was persistent despite supplemental oxygen at his nursing facility. Yesterday, the patient was more tired than usual. He slept for much of the day and was otherwise in his wheelchair for many hours at a time. Prior to today, however, he had not complained of any symptoms other than tiredness to his caregivers including fever, cough, headaches, N/V. Yesterday his eating/drinking/bathroom habits were normal per his caregivers. Family reports he has frequent chills, though this is normal for him. Family has observed a progressing wet cough over the past week.  At time of admission, pt is too drowsy and confused to answer all questions, but he denies acute pain or dyspnea. History is primarily provided by his sister (his POA) and niece at bedside and one of his nurses at [employer] via telephone.  ED Course: Pt arrived to [facility] ED via EMS from [facility] with low oxygen saturations 85% on RA and AMS. He was febrile, 101, tachcardic 103, and tachypnic 33. He was disoriented but able to follow commands and move all extremities. He had elevated lactate 2.2. CMP with low CO2 16.  Respiratory viral panel negative. CBC without white count. CXR with L Lung opacities suggestive of pneumonia. Sepsis protocol activated. Blood cultures drawn. IMTS consulted for admission.  Past Medical History Past Medical History:  Diagnosis Date   Alzheimer's dementia (HCC)    Hypertension    Prostate disorder     Meds:  Tylenol 1000 q8h prn Vit B12 every day Vit D3 every day Amlodipine Besylate 5mg  every day Tamsulosin 0.4mg  every day Losartan potassium 100mg  every day Myrbetriq ER 25mg  every day Ferrous sulfate 325mg  every day Senna 8.6-50 mg x2 at bedtime Quetiapine fumarate 75mg  at bedtime Divalproex Sod Dr 125 mg TID  Memantine HCL 10mg  BID Bisacodyl 10mg  suppository every day prn Bisacodyl 10mg  every Tuesday at bedtime Famotidine 20mg  prn Tramadol 50mg  BID prn [place] 0.65% nose spray QID prn Voltaren gel prn to knees and back  Past Surgical History Past Surgical History:  Procedure Laterality Date   TOTAL HIP ARTHROPLASTY     Social:  Lives at [facility] Occupation: retired Support: Living assistance and from family Level of Function: PCP: Dorothyann Peng, MD Substances: deferred  Family History:  No pertinent family history  Allergies: Allergies as of 12/17/2022 - Review Complete 12/18/2022  Allergen Reaction Noted   Penicillins Swelling 09/01/2013    Review of Systems: A complete ROS was  negative except as per HPI.   OBJECTIVE:   Physical Exam: Blood pressure (!) 151/87, pulse (!) 109, temperature 100 F (37.8 C), temperature source Axillary, resp. rate (!) 32, SpO2 93%.  Constitutional:Chronically ill-appaering. In no acute distress. HENT: Normocephalic, atraumatic,  Eyes: Sclera non-icteric, PERRL, EOM intact Neck:normal atraumatic, no neck masses, normal thyroid, no jvd Cardio:Regular rate and rhythm. No murmurs, rubs, or gallops. 2+ bilateral radial and dorsalis pedis  pulses. Pulm:Crackles at L lung. Normal  work of breathing on room air. Abdomen: Soft, non-tender, non-distended, positive bowel sounds. WGN:FAOZHYQM for extremity edema. Skin:Warm and dry. Neuro:Alert and oriented x2. Disoriented to time. Somnolent. Responds to questions but stutters which is not typical per family. No focal deficit noted. Psych:Pleasant mood and affect.  Labs: CBC    Component Value Date/Time   WBC 7.6 12/04/2022 1059   RBC 4.15 (L) 12/13/2022 1059   HGB 13.9 12/04/2022 1108   HCT 41.0 12/09/2022 1108   PLT 170 12/14/2022 1059   MCV 95.9 11/26/2022 1059   MCH 31.1 12/21/2022 1059   MCHC 32.4 12/10/2022 1059   RDW 15.3 12/21/2022 1059   LYMPHSABS 1.0 12/16/2022 1059   MONOABS 0.8 12/17/2022 1059   EOSABS 0.0 12/22/2022 1059   BASOSABS 0.0 11/24/2022 1059     CMP     Component Value Date/Time   NA 134 (L) 12/20/2022 1232   NA 140 07/09/2020 1520   K 3.7 12/24/2022 1232   CL 104 11/29/2022 1232   CO2 16 (L) 12/17/2022 1232   GLUCOSE 61 (L) 12/19/2022 1232   BUN 25 (H) 12/17/2022 1232   BUN 10 07/09/2020 1520   CREATININE 0.82 12/18/2022 1232   CALCIUM 7.3 (L) 12/11/2022 1232   PROT 3.9 (L) 12/10/2022 1232   PROT 7.1 07/09/2020 1520   ALBUMIN <1.5 (L) 12/22/2022 1232   ALBUMIN 4.0 07/09/2020 1520   AST 15 12/18/2022 1232   ALT 10 12/08/2022 1232   ALKPHOS 41 12/18/2022 1232   BILITOT 0.8 11/26/2022 1232   BILITOT <0.2 07/09/2020 1520   GFRNONAA >60 11/28/2022 1232   GFRAA 56 05/14/2020 0000    Imaging: DG Chest Port 1 View  Result Date: 12/07/2022 CLINICAL DATA:  Questionable sepsis. Evaluate for abnormality. Low oxygen saturations. EXAM: PORTABLE CHEST 1 VIEW COMPARISON:  06/24/2018 FINDINGS: Airspace densities at the left lung base and periphery of the left lung. Possible airspace disease in the right infrahilar region. Heart is grossly stable for size but slightly obscured by the left lung densities. Negative for a pneumothorax. No acute bone abnormality. IMPRESSION: 1. Left lung  opacities are suggestive for pneumonia. Questionable disease in the right hilar region. Electronically Signed   By: Richarda Overlie M.D.   On: 12/07/2022 11:48     EKG: personally reviewed my interpretation is sinus tachycardia.  ASSESSMENT & PLAN:   Assessment & Plan by Problem: Principal Problem:   Pneumonia  Elzia E Kuchera is a 89 y.o. male with pertinent PMH of dementia who presented with AMS and hypoxia and is admitted for Septic CAP with hypoxic respiratory failure and altered mental status.  CAP with Sepsis and Hypoxic Respiratory Failure Altered Mental Status - somnolence and confusion Pt presenting to [facility] ED with reduced saturations and somnolence demonstrating fever, tachycardia, elevated lactic with CXR positive for a L-sided lung infiltrate and L crackles. He meets sepsis criteria with infiltrate, fever, tachycardia, tachypnea, hypoxia. Not currently concerned for septic shock with sturdy blood pressure and well-perfused extremities. No known recent hospitalizations or  antibiotic use. Viral panel negative.   At time of admission, he was using supplemental oxygen intermittently. The mask fell off at times and he did not become hypoxic. Fever has broken with tylenol. He is somnolent but arousable, can respond to commands and answer some questions, albeit with difficulty as he stutters his words. He is not oriented to time and he is more confused and somnolent than usual per his family at bedside. Will look for a return to baseline, which family states is alert to person and place and able to converse in simple sentences. - LR infusion 1107ml/hr continuous and s/p LR bolus - Start Rocephin 2g every day x 5 days and Zithromax 500mg  every day x 5 days - Blood cultures drawn and pending - UA to be collected - O2 sat goal above 92% - Delirium precautions - cardiac monitoring x 24 hours - NPO until improved mental status, bedside swallow  Demenia - continue home Memantine HCL 10mg   BID - hold home Seroquel 75mg  at bedtime - continue home Depakote 125 mg TID   Chronic pain - of the lower back and knees - continue home Tylenol 1000 q8h prn - continue home Voltaren gel prn to knees and back - continue home Tramadol 50mg  BID prn  HTN - continue home amlodipine 5mg  every day and losartan 100mg  every day Overactive bladder - continue home myrbetriq 25mg  every day and Flomax 0.4mg  every day  Constipation - continue home Senna 8.6-50 mg x2 at bedtime, Bisacodyl 10mg  suppository every day prn and every Tuesday at bedtime, GERD - continue home Famotidine 20mg  prn  Diet: NPO VTE: Enoxaparin IVF: LR, 126mL/hr Code: DNR  Dispo: Admit patient to Observation with expected length of stay less than 2 midnights.  Signed: Katheran James, DO Internal Medicine Resident PGY-1  12/04/2022, 5:31 PM   Dr. Katheran James, DO Pager [phone]

## 2022-12-23 NOTE — Hospital Course (Addendum)
Wednesday got weak, nurse with him, left 11 p, came back at 6 am Thursday and was in same place Then was slumped over the following morning Sometimes doesn't want to go to bed and will stay in chair They got him to the bed and he went to sleep Today he was seen by pace and O2 was low  Manuel [name] has a lot of back pain but is laying on his back ok here Chronic wet cough, deeper and more gurgly over the past week Wednesday was last normal   UTI 1 month agoi  More severe than other times   Cognitive impairment  Admitted 12/22/2022  Allergies: Penicillins Pertinent Hx: Cognitive impairment, HTN  89 y.o. male p/w AMS  *Pneumonia: On room air, azithromycin and ceftriaxone, blood cultures, IV fluids  Consults: None Meds: azith/ctx, home meds VTE ppx: lovenox IVF: LR Diet: NPO pending BSS

## 2022-12-23 NOTE — ED Notes (Signed)
ED TO INPATIENT HANDOFF REPORT  ED Nurse Name and Phone #: Marcelino Duster [phone]  S Name/Age/Gender Manuel [name] 89 y.o. male Room/Bed: 025C/025C  Code Status   Code Status: Prior  Home/SNF/Other Skilled nursing facility Patient oriented to: self Is this baseline? Yes   Triage Complete: Triage complete  Chief Complaint sepsis  Triage Note Patient arrives by EMS from [facility] with c/o low oxygen saturations 85-88%, patient at baseline is not oxygen dependent.  Staff also reported patient altered, and noted to be drooling on left side.     Allergies Allergies  Allergen Reactions   Penicillins Swelling    Tolerated ceftriaxone     Level of Care/Admitting Diagnosis ED Disposition     ED Disposition  Admit   Condition  --   Comment  The patient appears reasonably stabilized for admission considering the current resources, flow, and capabilities available in the ED at this time, and I doubt any other [facility] requiring further screening and/or treatment in the ED prior to admission is  present.          B Medical/Surgery History Past Medical History:  Diagnosis Date   Alzheimer's dementia (HCC)    Hypertension    Prostate disorder    Past Surgical History:  Procedure Laterality Date   TOTAL HIP ARTHROPLASTY       A IV Location/Drains/Wounds Patient Lines/Drains/Airways Status     Active Line/Drains/Airways     Name Placement date Placement time Site Days   Peripheral IV 11/29/2022 20 G Right Antecubital 12/19/2022  1105  Antecubital  less than 1   Peripheral IV 12/11/2022 20 G Posterior;Right Forearm 12/18/2022  --  Forearm  less than 1            Intake/Output Last 24 hours No intake or output data in the 24 hours ending 11/27/2022 1447  Labs/Imaging Results for orders placed or performed during the hospital encounter of 11/26/2022 (from the past 48 hour(s))  Resp panel by RT-PCR (RSV, Flu A&B, Covid) Anterior Nasal Swab     Status: None    Collection Time: 12/18/2022 10:59 AM   Specimen: Anterior Nasal Swab  Result Value Ref Range   SARS Coronavirus 2 by RT PCR NEGATIVE NEGATIVE   Influenza A by PCR NEGATIVE NEGATIVE   Influenza B by PCR NEGATIVE NEGATIVE    Comment: (NOTE) The Xpert Xpress SARS-CoV-2/FLU/RSV plus assay is intended as an aid in the diagnosis of influenza from Nasopharyngeal swab specimens and should not be used as a sole basis for treatment. Nasal washings and aspirates are unacceptable for Xpert Xpress SARS-CoV-2/FLU/RSV testing.  Fact Sheet for Patients: BloggerCourse.com  Fact Sheet for Healthcare Providers: SeriousBroker.it  This test is not yet approved or cleared by the [place] FDA and has been authorized for detection and/or diagnosis of SARS-CoV-2 by FDA under an Emergency Use Authorization (EUA). This EUA will remain in effect (meaning this test can be used) for the duration of the COVID-19 declaration under Section 564(b)(1) of the Act, 21 U.S.C. section 360bbb-3(b)(1), unless the authorization is terminated or revoked.     Resp Syncytial Virus by PCR NEGATIVE NEGATIVE    Comment: (NOTE) Fact Sheet for Patients: BloggerCourse.com  Fact Sheet for Healthcare Providers: SeriousBroker.it  This test is not yet approved or cleared by the [place] FDA and has been authorized for detection and/or diagnosis of SARS-CoV-2 by FDA under an Emergency Use Authorization (EUA). This EUA will remain in effect (meaning this test can be  used) for the duration of the COVID-19 declaration under Section 564(b)(1) of the Act, 21 U.S.C. section 360bbb-3(b)(1), unless the authorization is terminated or revoked.  Performed at [facility], [address] [place], [place] [address]   CBC with Differential     Status: Abnormal   Collection Time: 12/03/2022 10:59 AM  Result Value Ref Range    WBC 7.6 4.0 - 10.5 K/uL   RBC 4.15 (L) 4.22 - 5.81 MIL/uL   Hemoglobin 12.9 (L) 13.0 - 17.0 g/dL   HCT 60.4 54.0 - 98.1 %   MCV 95.9 80.0 - 100.0 fL   MCH 31.1 26.0 - 34.0 pg   MCHC 32.4 30.0 - 36.0 g/dL   RDW 19.1 47.8 - 29.5 %   Platelets 170 150 - 400 K/uL   nRBC 0.0 0.0 - 0.2 %   Neutrophils Relative % 76 %   Neutro Abs 5.8 1.7 - 7.7 K/uL   Lymphocytes Relative 13 %   Lymphs Abs 1.0 0.7 - 4.0 K/uL   Monocytes Relative 10 %   Monocytes Absolute 0.8 0.1 - 1.0 K/uL   Eosinophils Relative 0 %   Eosinophils Absolute 0.0 0.0 - 0.5 K/uL   Basophils Relative 0 %   Basophils Absolute 0.0 0.0 - 0.1 K/uL   Immature Granulocytes 1 %   Abs Immature Granulocytes 0.04 0.00 - 0.07 K/uL    Comment: Performed at [facility], [address] [place], [place] [address]  I-Stat venous blood gas, [facility], [facility], [facility])     Status: Abnormal   Collection Time: 12/08/2022 11:08 AM  Result Value Ref Range   pH, Ven 7.379 7.25 - 7.43   pCO2, Ven 46.8 44 - 60 mmHg   pO2, Ven 31 (LL) 32 - 45 mmHg   Bicarbonate 27.6 20.0 - 28.0 mmol/L   TCO2 29 22 - 32 mmol/L   O2 Saturation 57 %   Acid-Base Excess 2.0 0.0 - 2.0 mmol/L   Sodium 142 135 - 145 mmol/L   Potassium 3.9 3.5 - 5.1 mmol/L   Calcium, Ion 1.18 1.15 - 1.40 mmol/L   HCT 41.0 39.0 - 52.0 %   Hemoglobin 13.9 13.0 - 17.0 g/dL   Sample type VENOUS    Comment NOTIFIED PHYSICIAN   I-Stat Lactic Acid, ED     Status: None   Collection Time: 11/29/2022 11:09 AM  Result Value Ref Range   Lactic Acid, Venous 1.6 0.5 - 1.9 mmol/L  Protime-INR     Status: Abnormal   Collection Time: 12/06/2022 12:32 PM  Result Value Ref Range   Prothrombin Time 20.1 (H) 11.4 - 15.2 seconds   INR 1.7 (H) 0.8 - 1.2    Comment: (NOTE) INR goal varies based on device and disease states. Performed at [facility], [address] [place], [place] [address]   APTT     Status: Abnormal   Collection Time: 12/11/2022 12:32 PM  Result Value Ref Range   aPTT 44 (H) 24 -  36 seconds    Comment:        IF BASELINE aPTT IS ELEVATED, SUGGEST PATIENT RISK ASSESSMENT BE USED TO DETERMINE APPROPRIATE ANTICOAGULANT THERAPY. Performed at [facility], [address], [place], [place] [address]   Comprehensive metabolic panel     Status: Abnormal   Collection Time: 11/30/2022 12:32 PM  Result Value Ref Range   Sodium 134 (L) 135 - 145 mmol/L   Potassium 3.7 3.5 - 5.1  mmol/L   Chloride 104 98 - 111 mmol/L   CO2 16 (L) 22 - 32 mmol/L   Glucose, Bld 61 (L) 70 - 99 mg/dL    Comment: Glucose reference range applies only to samples taken after fasting for at least 8 hours.   BUN 25 (H) 8 - 23 mg/dL   Creatinine, Ser 1.61 0.61 - 1.24 mg/dL   Calcium 7.3 (L) 8.9 - 10.3 mg/dL   Total Protein 3.9 (L) 6.5 - 8.1 g/dL   Albumin <0.9 (L) 3.5 - 5.0 g/dL   AST 15 15 - 41 U/L   ALT 10 0 - 44 U/L   Alkaline Phosphatase 41 38 - 126 U/L   Total Bilirubin 0.8 0.3 - 1.2 mg/dL   GFR, Estimated >60 >45 mL/min    Comment: (NOTE) Calculated using the CKD-EPI Creatinine Equation (2021)    Anion gap 14 5 - 15    Comment: Performed at [facility], [address], [place], [place] [address]  I-Stat Lactic Acid, ED     Status: Abnormal   Collection Time: 11/24/2022 12:56 PM  Result Value Ref Range   Lactic Acid, Venous 2.2 (HH) 0.5 - 1.9 mmol/L   Comment NOTIFIED PHYSICIAN    DG Chest Port 1 View  Result Date: 11/27/2022 CLINICAL DATA:  Questionable sepsis. Evaluate for abnormality. Low oxygen saturations. EXAM: PORTABLE CHEST 1 VIEW COMPARISON:  06/24/2018 FINDINGS: Airspace densities at the left lung base and periphery of the left lung. Possible airspace disease in the right infrahilar region. Heart is grossly stable for size but slightly obscured by the left lung densities. Negative for a pneumothorax. No acute bone abnormality. IMPRESSION: 1. Left lung opacities are suggestive for pneumonia. Questionable disease in the right hilar region. Electronically Signed   By: Richarda Overlie M.D.   On: 12/15/2022 11:48    Pending Labs Unresulted Labs (From admission, onward)     Start     Ordered   12/02/2022 1041  Blood Culture (routine x 2)  (Septic presentation on arrival (screening labs, nursing and treatment orders for obvious sepsis))  BLOOD CULTURE X 2,   STAT      12/02/2022 1042   12/17/2022 1041  Urinalysis, w/ Reflex to Culture (Infection Suspected) -Urine, Catheterized  (Septic presentation on arrival (screening labs, nursing and treatment orders for obvious sepsis))  ONCE - URGENT,   URGENT       Question:  Specimen Source  Answer:  Urine, Catheterized   11/30/2022 1042            Vitals/Pain Today's Vitals   11/27/2022 1145 12/04/2022 1200 11/29/2022 1230 12/07/2022 1420  BP: (!) 154/89 (!) 158/90 (!) 156/93 (!) 151/87  Pulse: (!) 107 (!) 106 (!) 103 (!) 109  Resp: (!) 33 (!) 36 (!) 33 (!) 32  Temp:    100 F (37.8 C)  TempSrc:    Axillary  SpO2: 95% 94% 96% 93%    Isolation Precautions No active isolations  Medications Medications  lactated ringers infusion (has no administration in time range)  azithromycin (ZITHROMAX) 500 mg in sodium chloride 0.9 % 250 mL IVPB (0 mg Intravenous Stopped 12/06/2022 1330)  cefTRIAXone (ROCEPHIN) 2 g in sodium chloride 0.9 % 100 mL IVPB (0 g Intravenous Stopped 12/15/2022 1141)  lactated ringers bolus 1,000 mL (0 mLs Intravenous Stopped 12/13/2022 1212)  acetaminophen (TYLENOL) suppository 650 mg (650 mg Rectal Given 12/10/2022 1117)    Mobility walks with device     Focused  Assessments Pulmonary Assessment Handoff:  Lung sounds: Bilateral Breath Sounds: Diminished O2 Device: Simple Mask O2 Flow Rate (L/min): 8 L/min Neuro:   Patient recognizes family members.  Skin: intact    R Recommendations: See Admitting Provider Note  Report given to:   Additional Notes: patient mouth breather.

## 2022-12-24 ENCOUNTER — Other Ambulatory Visit: Payer: Self-pay

## 2022-12-24 ENCOUNTER — Encounter (HOSPITAL_COMMUNITY): Payer: Self-pay | Admitting: Internal Medicine

## 2022-12-24 DIAGNOSIS — G8929 Other chronic pain: Secondary | ICD-10-CM | POA: Diagnosis present

## 2022-12-24 DIAGNOSIS — F02B18 Dementia in other diseases classified elsewhere, moderate, with other behavioral disturbance: Secondary | ICD-10-CM | POA: Diagnosis present

## 2022-12-24 DIAGNOSIS — J9601 Acute respiratory failure with hypoxia: Secondary | ICD-10-CM | POA: Diagnosis present

## 2022-12-24 DIAGNOSIS — Z7189 Other specified counseling: Secondary | ICD-10-CM | POA: Diagnosis not present

## 2022-12-24 DIAGNOSIS — F02B4 Dementia in other diseases classified elsewhere, moderate, with anxiety: Secondary | ICD-10-CM | POA: Diagnosis present

## 2022-12-24 DIAGNOSIS — K5900 Constipation, unspecified: Secondary | ICD-10-CM | POA: Diagnosis present

## 2022-12-24 DIAGNOSIS — J189 Pneumonia, unspecified organism: Secondary | ICD-10-CM | POA: Diagnosis present

## 2022-12-24 DIAGNOSIS — Z1152 Encounter for screening for COVID-19: Secondary | ICD-10-CM | POA: Diagnosis not present

## 2022-12-24 DIAGNOSIS — Z87891 Personal history of nicotine dependence: Secondary | ICD-10-CM | POA: Diagnosis not present

## 2022-12-24 DIAGNOSIS — Z66 Do not resuscitate: Secondary | ICD-10-CM | POA: Diagnosis present

## 2022-12-24 DIAGNOSIS — A419 Sepsis, unspecified organism: Secondary | ICD-10-CM | POA: Diagnosis present

## 2022-12-24 DIAGNOSIS — Z79899 Other long term (current) drug therapy: Secondary | ICD-10-CM | POA: Diagnosis not present

## 2022-12-24 DIAGNOSIS — I10 Essential (primary) hypertension: Secondary | ICD-10-CM | POA: Diagnosis present

## 2022-12-24 DIAGNOSIS — E872 Acidosis, unspecified: Secondary | ICD-10-CM | POA: Diagnosis present

## 2022-12-24 DIAGNOSIS — E7800 Pure hypercholesterolemia, unspecified: Secondary | ICD-10-CM | POA: Diagnosis present

## 2022-12-24 DIAGNOSIS — Z515 Encounter for palliative care: Secondary | ICD-10-CM | POA: Diagnosis not present

## 2022-12-24 DIAGNOSIS — N3281 Overactive bladder: Secondary | ICD-10-CM | POA: Diagnosis present

## 2022-12-24 DIAGNOSIS — G9340 Encephalopathy, unspecified: Secondary | ICD-10-CM | POA: Diagnosis present

## 2022-12-24 DIAGNOSIS — K219 Gastro-esophageal reflux disease without esophagitis: Secondary | ICD-10-CM | POA: Diagnosis present

## 2022-12-24 DIAGNOSIS — R6520 Severe sepsis without septic shock: Secondary | ICD-10-CM | POA: Diagnosis present

## 2022-12-24 DIAGNOSIS — G309 Alzheimer's disease, unspecified: Secondary | ICD-10-CM | POA: Diagnosis present

## 2022-12-24 LAB — BASIC METABOLIC PANEL
Anion gap: 12 (ref 5–15)
BUN: 31 mg/dL — ABNORMAL HIGH (ref 8–23)
CO2: 21 mmol/L — ABNORMAL LOW (ref 22–32)
Calcium: 8.7 mg/dL — ABNORMAL LOW (ref 8.9–10.3)
Chloride: 107 mmol/L (ref 98–111)
Creatinine, Ser: 1.09 mg/dL (ref 0.61–1.24)
GFR, Estimated: >60 mL/min (ref >60)
Glucose, Bld: 105 mg/dL — ABNORMAL HIGH (ref 70–99)
Potassium: 3.9 mmol/L (ref 3.5–5.1)
Sodium: 140 mmol/L (ref 135–145)

## 2022-12-24 LAB — LACTIC ACID, PLASMA
Lactic Acid, Venous: 1.3 mmol/L (ref 0.5–1.9)
Lactic Acid, Venous: 1.1 mmol/L (ref 0.5–1.9)

## 2022-12-24 LAB — URINALYSIS, W/ REFLEX TO CULTURE (INFECTION SUSPECTED)
Bacteria, UA: NONE SEEN
Bilirubin Urine: NEGATIVE
Glucose, UA: NEGATIVE mg/dL
Ketones, ur: 20 mg/dL — AB
Leukocytes,Ua: NEGATIVE
Nitrite: NEGATIVE
Protein, ur: 100 mg/dL — AB
Specific Gravity, Urine: 1.025 (ref 1.005–1.030)
pH: 6 (ref 5.0–8.0)

## 2022-12-24 LAB — CBC WITH DIFFERENTIAL/PLATELET
Abs Immature Granulocytes: 0.05 10*3/uL (ref 0.00–0.07)
Basophils Absolute: 0 10*3/uL (ref 0.0–0.1)
Basophils Relative: 0 %
Eosinophils Absolute: 0 10*3/uL (ref 0.0–0.5)
Eosinophils Relative: 0 %
HCT: 37.5 % — ABNORMAL LOW (ref 39.0–52.0)
Hemoglobin: 12.3 g/dL — ABNORMAL LOW (ref 13.0–17.0)
Immature Granulocytes: 1 %
Lymphocytes Relative: 13 %
Lymphs Abs: 1 10*3/uL (ref 0.7–4.0)
MCH: 30.7 pg (ref 26.0–34.0)
MCHC: 32.8 g/dL (ref 30.0–36.0)
MCV: 93.5 fL (ref 80.0–100.0)
Monocytes Absolute: 0.8 10*3/uL (ref 0.1–1.0)
Monocytes Relative: 10 %
Neutro Abs: 5.9 10*3/uL (ref 1.7–7.7)
Neutrophils Relative %: 76 %
Platelets: 164 10*3/uL (ref 150–400)
RBC: 4.01 MIL/uL — ABNORMAL LOW (ref 4.22–5.81)
RDW: 14.6 % (ref 11.5–15.5)
WBC: 7.8 10*3/uL (ref 4.0–10.5)
nRBC: 0 % (ref 0.0–0.2)

## 2022-12-24 LAB — BLOOD GAS, VENOUS
Acid-Base Excess: 3 mmol/L — ABNORMAL HIGH (ref 0.0–2.0)
Bicarbonate: 27.1 mmol/L (ref 20.0–28.0)
O2 Saturation: 88.7 %
Patient temperature: 37.3
pCO2, Ven: 40 mmHg — ABNORMAL LOW (ref 44–60)
pH, Ven: 7.45 — ABNORMAL HIGH (ref 7.25–7.43)
pO2, Ven: 56 mmHg — ABNORMAL HIGH (ref 32–45)

## 2022-12-24 LAB — GLUCOSE, CAPILLARY: Glucose-Capillary: 87 mg/dL (ref 70–99)

## 2022-12-24 MED ORDER — LACTATED RINGERS IV SOLN: INTRAVENOUS | Status: AC

## 2022-12-24 MED ORDER — TRAZODONE HCL 50 MG PO TABS: 25.0000 mg | ORAL_TABLET | Freq: Once | ORAL | Status: DC

## 2022-12-24 NOTE — Progress Notes (Signed)
MEWS Progress Note  Patient Details Name: Manuel [name] MRN: [id number] DOB: 12-12-1932 Today's Date: 12/24/2022   MEWS Flowsheet Documentation:  Assess: MEWS Score Temp: 97.7 F (36.5 C) BP: (!) 150/80 MAP (mmHg): 100 Pulse Rate: (!) 101 ECG Heart Rate: (!) 101 Resp: (!) 33 Level of Consciousness: Alert SpO2: 94 % O2 Device: Nasal Cannula O2 Flow Rate (L/min): 3 L/min Assess: MEWS Score MEWS Temp: 0 MEWS Systolic: 0 MEWS Pulse: 1 MEWS RR: 2 MEWS LOC: 0 MEWS Score: 3 MEWS Score Color: Yellow Assess: SIRS CRITERIA SIRS Temperature : 0 SIRS Respirations : 1 SIRS Pulse: 1 SIRS WBC: 0 SIRS Score Sum : 2 SIRS Temperature : 0 SIRS Pulse: 1 SIRS Respirations : 1 SIRS WBC: 0 SIRS Score Sum : 2 Assess: if the MEWS score is Yellow or Red Were vital signs accurate and taken at a resting state?: Yes Does the patient meet 2 or more of the SIRS criteria?: No Does the patient have a confirmed or suspected source of infection?: No MEWS guidelines implemented : No, previously yellow, continue vital signs every 4 hours     Pt trigger yellow MEWS throughout the shift, it has irreg resp and mouth breathes, sats are noted and unchanged on 3L. Pt is more arousable a/o to name place time and unclear to situation. He states his niece brought him in. No acute distress follows commands, c/o of back back and sked to sit upright.    Charmian Muff 12/24/2022, 4:57 AM

## 2022-12-24 NOTE — Progress Notes (Addendum)
HD#0 SUBJECTIVE:  Patient Summary: Manuel [name] is a 89 y.o. with a pertinent PMH of dementia, who presented with AMS and hypoxia and admitted for Septic CAP with hypoxic respiratory failure.   Overnight Events: none  Interim History: Pt somnolent overnight and at time of evaluation. He is however immediately arousable. He does not stay awake for long or communicate fully. He does not report and concerns.  OBJECTIVE:  Vital Signs: Vitals:   12/13/2022 2030 12/06/2022 2103 12/19/2022 2105 12/01/2022 2300  BP: (!) 145/77 (!) 145/77  (!) 150/80  Pulse: (!) 102 (!) 102 (!) 101 (!) 101  Resp:  (!) 30 (!) 28 (!) 33  Temp: 98.8 F (37.1 C) 98.2 F (36.8 C)  97.7 F (36.5 C)  TempSrc: Oral Oral  Oral  SpO2: 94% 94% 93% 94%  Weight: 77.2 kg     Height: 5\' 7"  (1.702 m)      Supplemental O2: Nasal Cannula SpO2: 94 % O2 Flow Rate (L/min): 3 L/min  Filed Weights   12/10/2022 2030  Weight: 77.2 kg     Intake/Output Summary (Last 24 hours) at 12/24/2022 0547 Last data filed at 12/24/2022 0301 Gross per 24 hour  Intake 1360.77 ml  Output 200 ml  Net 1160.77 ml   Net IO Since Admission: 1,160.77 mL [12/24/22 0547]  Physical Exam: Physical Exam Constitutional:      General: He is not in acute distress.    Appearance: He is well-developed. He is ill-appearing.  HENT:     Head: Normocephalic and atraumatic.  Cardiovascular:     Rate and Rhythm: Normal rate and regular rhythm.  Pulmonary:     Effort: Pulmonary effort is normal. No tachypnea.     Breath sounds: Normal breath sounds.  Abdominal:     General: Bowel sounds are normal.     Palpations: Abdomen is soft.  Skin:    General: Skin is warm and dry.  Neurological:     Mental Status: He is alert. He is disoriented.     Comments: Word Stuttering improved from yesterday    Patient Lines/Drains/Airways Status     Active Line/Drains/Airways     Name Placement date Placement time Site Days   Peripheral IV 12/03/2022 20 G  Right Antecubital 12/18/2022  1105  Antecubital  1   Peripheral IV 12/24/2022 20 G Posterior;Right Forearm 12/11/2022  --  Forearm  1   External Urinary Catheter 12/02/2022  2030  --  1            ASSESSMENT/PLAN:  Assessment: Principal Problem:   Pneumonia  Manuel [name] is a 89 y.o. male with pertinent PMH of dementia who presented with AMS and hypoxia and is admitted for Septic CAP with hypoxic respiratory failure and altered mental status.   Plan: CAP with Sepsis and Hypoxic Respiratory Failure Altered Mental Status - somnolence and confusion Pt presented to [facility] ED with reduced saturations and somnolence demonstrating fever, tachycardia, elevated lactic with CXR positive for a L-sided lung infiltrate and L crackles. He meet sepsis criteria with infiltrate, fever, tachycardia, tachypnea, hypoxia. No known recent hospitalizations or antibiotic use. Viral panel negative. At time of admission, he was using supplemental oxygen intermittently. The mask fell off at times and he did not become hypoxic. Fever was broken with tylenol. He was somnolent but arousable, and responded to commands and answered some questions, albeit with difficulty as he stuttered his words. He was not oriented to time and was more confused and somnolent  than usual per his family at bedside.  He remains in a similar state today. Will further look for a return to baseline as CAP treatment goes on. Per family, baseline is alert to person and place and able to converse in simple sentences. Not oriented to place today. RESULTS - Procal 3.12 - MRSA negative - Lactic acid pending - Blood cultures drawn and pending, preliminary results negative - UA not infectious PLAN - continue to monitor for septic shock. Not currently concerned. Sturdy blood pressure and well-perfused extremities.  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 10mg  BID - hold home Seroquel 75mg  at bedtime - continue home Depakote 125 mg TID    Chronic pain - of the lower back and knees - continue home Tylenol 1000 q8h prn - continue home Voltaren gel prn to knees and back - continue home Tramadol 50mg  BID prn   HTN - continue home amlodipine 5mg  every day and losartan 100mg  every day Overactive bladder - continue home myrbetriq 25mg  every day and Flomax 0.4mg  every day  Constipation - continue home Senna 8.6-50 mg x2 at bedtime, Bisacodyl 10mg  suppository every day prn and every Tuesday at bedtime, GERD - continue home Famotidine 20mg  prn  Best Practice: Diet: NPO VTE: Enoxaparin IVF: none Code: DNR  Signature: Katheran James, D.O.  Internal Medicine Resident, PGY-1 [facility]  Pager: [phone] 5:47 AM, 12/24/2022   Please contact the on call pager after 5 pm and on weekends at [phone].

## 2022-12-24 NOTE — Progress Notes (Signed)
MEWS Progress Note Patient has been Yellow and Red MEWS since admission. Notified MD due to patient being tachypneic w/periods of apnea and tachycardic. Will continue q4v/s.No new orders given.  Patient Details Name: Manuel [name] MRN: [id number] DOB: [birth year] Today's Date: 12/24/2022   MEWS Flowsheet Documentation:  Assess: MEWS Score Temp: 99 F (37.2 C) BP: (!) 160/85 MAP (mmHg): 106 Pulse Rate: (!) 106 ECG Heart Rate: (!) 102 Resp: (!) 29 Level of Consciousness: Responds to Voice SpO2: 97 % O2 Device: Nasal Cannula O2 Flow Rate (L/min): 3 L/min Assess: MEWS Score MEWS Temp: 0 MEWS Systolic: 0 MEWS Pulse: 1 MEWS RR: 2 MEWS LOC: 1 MEWS Score: 4 MEWS Score Color: Red Assess: SIRS CRITERIA SIRS Temperature : 0 SIRS Respirations : 1 SIRS Pulse: 1 SIRS WBC: 0 SIRS Score Sum : 2 SIRS Temperature : 0 SIRS Pulse: 1 SIRS Respirations : 1 SIRS WBC: 0 SIRS Score Sum : 2 Assess: if the MEWS score is Yellow or Red Were vital signs accurate and taken at a resting state?: Yes Does the patient meet 2 or more of the SIRS criteria?: Yes Does the patient have a confirmed or suspected source of infection?: Yes Notify: Charge Nurse/RN Name of Charge Nurse/RN NotifiedTula Nakayama, CRN Provider Notification Provider Name/Title: Versie Starks, MD Date Provider Notified: 12/24/22 Time Provider Notified: 2000 Method of Notification: Page Notification Reason: Critical Result (Tachypneic w/apneic periods, Tachycardic) Provider response: No new orders Date of Provider Response: 12/24/22 Time of Provider Response: 2000      Ethel Rana 12/24/2022, 8:05 PM

## 2022-12-25 DIAGNOSIS — A419 Sepsis, unspecified organism: Secondary | ICD-10-CM | POA: Diagnosis not present

## 2022-12-25 DIAGNOSIS — Z87891 Personal history of nicotine dependence: Secondary | ICD-10-CM | POA: Diagnosis not present

## 2022-12-25 DIAGNOSIS — J9601 Acute respiratory failure with hypoxia: Secondary | ICD-10-CM

## 2022-12-25 DIAGNOSIS — J189 Pneumonia, unspecified organism: Secondary | ICD-10-CM

## 2022-12-25 LAB — GLUCOSE, CAPILLARY: Glucose-Capillary: 85 mg/dL (ref 70–99)

## 2022-12-25 LAB — BASIC METABOLIC PANEL
Anion gap: 10 (ref 5–15)
BUN: 25 mg/dL — ABNORMAL HIGH (ref 8–23)
CO2: 24 mmol/L (ref 22–32)
Calcium: 8.7 mg/dL — ABNORMAL LOW (ref 8.9–10.3)
Chloride: 106 mmol/L (ref 98–111)
Creatinine, Ser: 1.07 mg/dL (ref 0.61–1.24)
GFR, Estimated: >60 mL/min (ref >60)
Glucose, Bld: 97 mg/dL (ref 70–99)
Potassium: 3.9 mmol/L (ref 3.5–5.1)
Sodium: 140 mmol/L (ref 135–145)

## 2022-12-25 LAB — CBC
HCT: 37.6 % — ABNORMAL LOW (ref 39.0–52.0)
Hemoglobin: 12.2 g/dL — ABNORMAL LOW (ref 13.0–17.0)
MCH: 30 pg (ref 26.0–34.0)
MCHC: 32.4 g/dL (ref 30.0–36.0)
MCV: 92.4 fL (ref 80.0–100.0)
Platelets: 155 10*3/uL (ref 150–400)
RBC: 4.07 MIL/uL — ABNORMAL LOW (ref 4.22–5.81)
RDW: 14.9 % (ref 11.5–15.5)
WBC: 9.7 10*3/uL (ref 4.0–10.5)
nRBC: 0 % (ref 0.0–0.2)

## 2022-12-25 MED ORDER — LABETALOL HCL 5 MG/ML IV SOLN
10.0000 mg | Freq: Once | INTRAVENOUS | Status: AC
  Administered 2022-12-25: 10 mg via INTRAVENOUS
  Filled 2022-12-25: qty 4

## 2022-12-25 MED ORDER — LACTATED RINGERS IV SOLN: INTRAVENOUS | Status: AC

## 2022-12-25 NOTE — Plan of Care (Signed)

## 2022-12-25 NOTE — Progress Notes (Signed)
MD notified of increased bps 174/80.

## 2022-12-25 NOTE — Progress Notes (Signed)
HD#1 SUBJECTIVE:  Patient Summary: Manuel [name] is a 89 y.o. with a pertinent PMH of dementia, who presented with AMS and hypoxia and admitted for Septic CAP with hypoxic respiratory failure.   Overnight Events: none  Interim History: Stable this morning without any worsening shortness of breath.  He states that his breathing is actually a little bit better.  OBJECTIVE:  Vital Signs: Vitals:   12/24/22 0800 12/24/22 1700 12/24/22 1941 12/24/22 2300  BP: (!) 143/77  (!) 160/85 (!) 152/86  Pulse:   (!) 106 100  Resp:   (!) 29 (!) 28  Temp:  98.1 F (36.7 C) 99 F (37.2 C) 98.3 F (36.8 C)  TempSrc:  Oral Axillary Axillary  SpO2:   97% 96%  Weight:      Height:       Supplemental O2: Nasal Cannula SpO2: 96 % O2 Flow Rate (L/min): 3 L/min  Filed Weights   12/13/2022 2030 12/24/22 0500  Weight: 77.2 kg 77.2 kg     Intake/Output Summary (Last 24 hours) at 12/25/2022 2130 Last data filed at 12/25/2022 0400 Gross per 24 hour  Intake 0 ml  Output 850 ml  Net -850 ml   Net IO Since Admission: 310.77 mL [12/25/22 0624]  Physical Exam: Physical Exam Constitutional:      General: He is not in acute distress.    Appearance: He is well-developed. He is ill-appearing.  HENT:     Head: Normocephalic and atraumatic.  Cardiovascular:     Rate and Rhythm: Normal rate and regular rhythm.  Pulmonary:     Effort: Pulmonary effort is normal. No tachypnea.     Breath sounds: Normal breath sounds.  Abdominal:     General: Bowel sounds are normal.     Palpations: Abdomen is soft.  Skin:    General: Skin is warm and dry.  Neurological:     Mental Status: He is alert.     Comments: Oriented to person only, more alert but otherwise stable from yesterday    Patient Lines/Drains/Airways Status     Active Line/Drains/Airways     Name Placement date Placement time Site Days   Peripheral IV 11/25/2022 20 G Right Antecubital 12/04/2022  1105  Antecubital  1   Peripheral IV 12/06/2022  20 G Posterior;Right Forearm 11/25/2022  --  Forearm  1   External Urinary Catheter 11/26/2022  2030  --  1            ASSESSMENT/PLAN:  Assessment: Principal Problem:   Pneumonia Active Problems:   Sepsis with acute hypoxic respiratory failure without septic shock (HCC)  Manuel [name] is a 89 y.o. male with pertinent PMH of dementia who presented with AMS and hypoxia and is admitted for Septic CAP with hypoxic respiratory failure and altered mental status.   Plan: CAP with Sepsis and Hypoxic Respiratory Failure Altered Mental Status - somnolence and confusion Stable respiratory status today.  Still Tachypneic but tachycardia is improved and he is slightly more alert.  We were able to wean oxygen during visit from 3 L to 2 L with sats still in the high 90s.  Will continue to treat for community-acquired pneumonia. 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 10mg  BID - hold home Seroquel 75mg  at bedtime - continue home Depakote  125 mg TID    Chronic pain - of the lower back and knees - continue home Tylenol 1000 q8h prn - continue home Voltaren gel prn to knees and back - continue home Tramadol 50mg  BID prn   HTN - continue home amlodipine 5mg  every day and losartan 100mg  every day Overactive bladder - continue home myrbetriq 25mg  every day and Flomax 0.4mg  every day  Constipation - continue home Senna 8.6-50 mg x2 at bedtime, Bisacodyl 10mg  suppository every day prn and every Tuesday at bedtime, GERD - continue home Famotidine 20mg  prn  Best Practice: Diet: NPO VTE: Enoxaparin IVF: none Code: DNR  Signature: Rocky Morel, DO Internal Medicine Resident, PGY-2 Pager# [phone] Please contact the on call pager after 5 pm and on weekends at [phone].  6:24 AM, 12/25/2022

## 2022-12-25 NOTE — Progress Notes (Addendum)
SLP Cancellation Note  Patient Details Name: Manuel [name] MRN: [id number] DOB: 06/27/1932   Cancelled treatment:       Reason Eval/Treat Not Completed: Fatigue/lethargy limiting ability to participate. Discussed with RN. Will f/u as able.  SLP checked back in around 15:00 but RN says that pt is still not able to maintain alertness for swallow eval. Will continue efforts.     Mahala Menghini., M.A. CCC-SLP Acute Rehabilitation Services Office [phone]  Secure chat preferred  12/25/2022, 10:24 AM

## 2022-12-25 DEATH — deceased

## 2022-12-26 ENCOUNTER — Inpatient Hospital Stay (HOSPITAL_COMMUNITY): Payer: Medicare (Managed Care)

## 2022-12-26 DIAGNOSIS — A419 Sepsis, unspecified organism: Secondary | ICD-10-CM | POA: Diagnosis not present

## 2022-12-26 DIAGNOSIS — J189 Pneumonia, unspecified organism: Secondary | ICD-10-CM | POA: Diagnosis not present

## 2022-12-26 DIAGNOSIS — J9601 Acute respiratory failure with hypoxia: Secondary | ICD-10-CM | POA: Diagnosis not present

## 2022-12-26 DIAGNOSIS — R6520 Severe sepsis without septic shock: Secondary | ICD-10-CM | POA: Diagnosis not present

## 2022-12-26 LAB — BASIC METABOLIC PANEL
Anion gap: 9 (ref 5–15)
BUN: 22 mg/dL (ref 8–23)
CO2: 23 mmol/L (ref 22–32)
Calcium: 8.6 mg/dL — ABNORMAL LOW (ref 8.9–10.3)
Chloride: 106 mmol/L (ref 98–111)
Creatinine, Ser: 1.02 mg/dL (ref 0.61–1.24)
GFR, Estimated: >60 mL/min (ref >60)
Glucose, Bld: 105 mg/dL — ABNORMAL HIGH (ref 70–99)
Potassium: 3.8 mmol/L (ref 3.5–5.1)
Sodium: 138 mmol/L (ref 135–145)

## 2022-12-26 LAB — CBC WITH DIFFERENTIAL/PLATELET
Abs Immature Granulocytes: 0.2 10*3/uL — ABNORMAL HIGH (ref 0.00–0.07)
Basophils Absolute: 0 10*3/uL (ref 0.0–0.1)
Basophils Relative: 0 %
Eosinophils Absolute: 0 10*3/uL (ref 0.0–0.5)
Eosinophils Relative: 0 %
HCT: 37.3 % — ABNORMAL LOW (ref 39.0–52.0)
Hemoglobin: 12.2 g/dL — ABNORMAL LOW (ref 13.0–17.0)
Immature Granulocytes: 2 %
Lymphocytes Relative: 11 %
Lymphs Abs: 1.1 10*3/uL (ref 0.7–4.0)
MCH: 30.7 pg (ref 26.0–34.0)
MCHC: 32.7 g/dL (ref 30.0–36.0)
MCV: 94 fL (ref 80.0–100.0)
Monocytes Absolute: 1 10*3/uL (ref 0.1–1.0)
Monocytes Relative: 10 %
Neutro Abs: 7.8 10*3/uL — ABNORMAL HIGH (ref 1.7–7.7)
Neutrophils Relative %: 77 %
Platelets: 170 10*3/uL (ref 150–400)
RBC: 3.97 MIL/uL — ABNORMAL LOW (ref 4.22–5.81)
RDW: 14.7 % (ref 11.5–15.5)
WBC: 10.2 10*3/uL (ref 4.0–10.5)
nRBC: 0 % (ref 0.0–0.2)

## 2022-12-26 LAB — GLUCOSE, CAPILLARY: Glucose-Capillary: 103 mg/dL — ABNORMAL HIGH (ref 70–99)

## 2022-12-26 MED ORDER — ACETAMINOPHEN 10 MG/ML IV SOLN
1000.0000 mg | Freq: Once | INTRAVENOUS | Status: AC
  Administered 2022-12-26: 1000 mg via INTRAVENOUS
  Filled 2022-12-26: qty 100

## 2022-12-26 MED ORDER — ACETAMINOPHEN 10 MG/ML IV SOLN
1000.0000 mg | Freq: Once | INTRAVENOUS | Status: DC
  Filled 2022-12-26: qty 100

## 2022-12-26 MED ORDER — ENOXAPARIN SODIUM 40 MG/0.4ML IJ SOSY
40.0000 mg | PREFILLED_SYRINGE | INTRAMUSCULAR | Status: DC
  Administered 2022-12-26: 40 mg via SUBCUTANEOUS
  Filled 2022-12-26: qty 0.4

## 2022-12-26 MED ORDER — METRONIDAZOLE 500 MG/100ML IV SOLN
500.0000 mg | Freq: Two times a day (BID) | INTRAVENOUS | Status: DC
  Administered 2022-12-26 – 2022-12-27 (×3): 500 mg via INTRAVENOUS
  Filled 2022-12-26 (×3): qty 100

## 2022-12-26 MED ORDER — RIVAROXABAN 10 MG PO TABS
10.0000 mg | ORAL_TABLET | Freq: Every day | ORAL | Status: DC
  Filled 2022-12-26: qty 1

## 2022-12-26 MED ORDER — METRONIDAZOLE 500 MG/100ML IV SOLN
500.0000 mg | Freq: Once | INTRAVENOUS | Status: AC
  Administered 2022-12-26: 500 mg via INTRAVENOUS
  Filled 2022-12-26: qty 100

## 2022-12-26 NOTE — Progress Notes (Signed)
Examined patient at bedside for MEWS change to 6 for worsening respiratory rate. When night team came into room, he was sleeping with mouth open. Patient was alert to his name, place. Denied pain. Mentioned he was nervous but that he did not have pain. He did not have secretions in mouth, has a stutter but we were able to understand his words. Patient did not like the TV or music on. He denied thirst or hunger. He mentioned he was comfortable sitting up in bed.  Per RN, patient has denied food. Has also declined all his PO medications today.   During out conversation, patient was calm and his RR went from 40 to 20-30s. His respiratory effort mildly labored and lung sounds with ronchi, but this was unchanged from last night when we also examined the patient. His level of interaction is improved from the past.  Suspect anxiety contributing to current state on known CAP for which antibiotic treatment has been expanded. Only changes in treatment at this time is adding back IV tylenol as he could be masking pain (has a hx of chronic pain in lower back and knees) and switching xarelto to enoxaparin.  Will continue to monitor for now. Morene Crocker, MD 9:59 PM

## 2022-12-26 NOTE — Progress Notes (Signed)
Blood pressure been on the 80's systolic asymptomatic , md made aware. Latest 150's systolic no order given. Continue to monitor .

## 2022-12-26 NOTE — Plan of Care (Signed)

## 2022-12-26 NOTE — Progress Notes (Signed)
Patient is alert to voice. Answers appropriately to questions but cannot stay awake. Patient is lethargic which is not a new finding. He is NPO pending a speech eval. Day team reported that SLP came to evaluate patient but that he was lethargic. Patient has been a Yellow and Red MEWS simultaneously for most of his admission due to heart rate and respirations. CXR done revealed no new findings. Oral mucosa dry w/lips peeling-performed oral care. Became febrile throughout the night and given IV tylenol-fever decreased from 101.3 to 99 w/last temp at 0500 being 99.9. Blood cultures obtained and flagyl infused. WBC currently 10.2. Patient does not like to be manipulated and says "no" and "uh uh" when trying to move him to switch positions in bed. Currently on 2L of oxygen and saturations are WDL. No pain indicators present.

## 2022-12-26 NOTE — TOC Initial Note (Signed)
Transition of Care [facility]) - Initial/Assessment Note    Patient Details  Name: Manuel [name] MRN: [id number] Date of Birth: 15-Feb-1933  Transition of Care [facility]) CM/SW Contact:    Erin Sons, LCSW Phone Number: 12/26/2022, 1:48 PM  Clinical Narrative:                   CSW confirmed with [facility] that pt is LTC there and can return on DC.   Expected Discharge Plan: Skilled Nursing Facility Barriers to Discharge: Continued Medical Work up     Activities of Daily Living Home Assistive Devices/Equipment: Wheelchair ADL Screening (condition at time of admission) Patient's cognitive ability adequate to safely complete daily activities?: No Is the patient deaf or have difficulty hearing?: No Does the patient have difficulty seeing, even when wearing glasses/contacts?: Yes Does the patient have difficulty concentrating, remembering, or making decisions?: Yes Patient able to express need for assistance with ADLs?: Yes Does the patient have difficulty dressing or bathing?: No Independently performs ADLs?: Yes (appropriate for developmental age) Does the patient have difficulty walking or climbing stairs?: Yes Weakness of Legs: Both Weakness of Arms/Hands: Both  Permission Sought/Granted                  Emotional Assessment              Admission diagnosis:  Pneumonia [J18.9] Sepsis with acute hypoxic respiratory failure without septic shock, due to unspecified organism (HCC) [A41.9, R65.20, J96.01] Patient Active Problem List   Diagnosis Date Noted   Sepsis with acute hypoxic respiratory failure without septic shock (HCC) 12/24/2022   Pneumonia 12/24/2022   Moderate dementia with behavioral disturbance (HCC) 11/26/2018   FUO (fever of unknown origin) 06/24/2018   Essential hypertension, benign 06/06/2018   Other abnormal glucose 06/06/2018   Pure hypercholesterolemia 06/06/2018   PCP:  Dorothyann Peng, MD Pharmacy:  No Pharmacies Listed    Social  Determinants of Health (SDOH) Social History: SDOH Screenings   Food Insecurity: No Food Insecurity (12/10/2022)  Housing: Low Risk  (12/03/2022)  Transportation Needs: No Transportation Needs (11/24/2022)  Utilities: Not At Risk (12/22/2022)  Depression (PHQ2-9): Low Risk  (05/27/2020)  Financial Resource Strain: Low Risk  (05/27/2020)  Physical Activity: Inactive (05/27/2020)  Social Connections: Unknown (07/04/2018)  Stress: No Stress Concern Present (05/27/2020)  Tobacco Use: Medium Risk (12/24/2022)   SDOH Interventions:     Readmission Risk Interventions     No data to display

## 2022-12-26 NOTE — Progress Notes (Signed)
HD#2 SUBJECTIVE:  Patient Summary: Manuel [name] is a 89 y.o. with a pertinent PMH of dementia, who presented with AMS and hypoxia and admitted for Septic CAP with hypoxic respiratory failure.   Overnight Events: Fever of 101.3. Visited at bedside, noted to have increased oral secretions. Diaphoretic, tachycardic, tachypnic, R low lung crackles. RN had concerns of choking on water during the day. Flagyl started per concern of aspiration.  Interim History: Pt remains somnolent. Manuel [name] is easily aroused and reports being cold. Does not report pain.  OBJECTIVE:  Vital Signs: Vitals:   12/26/22 0114 12/26/22 0200 12/26/22 0205 12/26/22 0500  BP:  (!) 159/97  (!) 143/85  Pulse:  98  88  Resp:  (!) 35  20  Temp: (!) 101.3 F (38.5 C)  99 F (37.2 C) 99.9 F (37.7 C)  TempSrc: Oral  Oral Oral  SpO2:  95%  95%  Weight:      Height:       Supplemental O2: Nasal Cannula SpO2: 95 % O2 Flow Rate (L/min): 3 L/min  Filed Weights   12/24/2022 2030 12/24/22 0500  Weight: 77.2 kg 77.2 kg     Intake/Output Summary (Last 24 hours) at 12/26/2022 0624 Last data filed at 12/26/2022 0504 Gross per 24 hour  Intake 0 ml  Output 1675 ml  Net -1675 ml   Net IO Since Admission: -1,364.23 mL [12/26/22 0624]  Physical Exam: Physical Exam Constitutional:      General: Manuel [name] is not in acute distress.    Appearance: Manuel [name] is ill-appearing.     Interventions: Manuel [name] is not intubated. HENT:     Head: Normocephalic and atraumatic.  Cardiovascular:     Rate and Rhythm: Normal rate and regular rhythm.     Pulses: Normal pulses.     Heart sounds: Normal heart sounds.  Pulmonary:     Effort: Pulmonary effort is normal. Tachypnea present. No respiratory distress. Manuel [name] is not intubated.     Breath sounds: Rhonchi present.  Abdominal:     General: Bowel sounds are normal.     Palpations: Abdomen is soft.     Tenderness: There is no abdominal tenderness.  Skin:    General: Skin is warm and dry.     Capillary  Refill: Capillary refill takes less than 2 seconds.  Neurological:     Mental Status: Manuel [name] is disoriented.  Psychiatric:        Mood and Affect: Mood normal.        Behavior: Behavior normal.     Patient Lines/Drains/Airways Status     Active Line/Drains/Airways     Name Placement date Placement time Site Days   Peripheral IV 11/26/2022 20 G Right Antecubital 12/20/2022  1105  Antecubital  3   Peripheral IV 12/18/2022 20 G Posterior;Right Forearm 12/16/2022  --  Forearm  3   External Urinary Catheter 12/05/2022  2030  --  3             ASSESSMENT/PLAN:  Assessment: Principal Problem:   Pneumonia Active Problems:   Sepsis with acute hypoxic respiratory failure without septic shock (HCC)  Manuel [name] is a 89 y.o. male with pertinent PMH of dementia who presented with AMS and hypoxia and is admitted for Septic CAP with hypoxic respiratory failure and altered mental status.   Plan: CAP with Sepsis and Hypoxic Respiratory Failure Altered Mental Status - somnolence and confusion Fevered overnight. Chest X-ray overnight grossly same. Stable respiratory status today. Still Tachypneic. Remains on  3L oxygen with sats in high 90s. Stuttering his words. Will continue to treat for community-acquired pneumonia + anerobic coverage. Will continue to monitor mental status. - Continue Rocephin 2g every day x 5 days and done with Zithromax 500mg . Today is day 4. - Continue flagyl 500mg  q12 - New blood cultures per overnight fever are pending - Wean to room air O2 sat goal above 92% - Delirium precautions - Still n.p.o., SLP eval yesterday was cancelled due to lethargy but will follow up   Demenia - continue home Memantine HCL 10mg  BID - hold home Seroquel 75mg  at bedtime - continue home Depakote 125 mg TID    Chronic pain - of the lower back and knees - continue home Tylenol 1000 q8h prn - continue home Voltaren gel prn to knees and back - continue home Tramadol 50mg  BID prn   HTN -  continue home amlodipine 5mg  every day and losartan 100mg  every day Overactive bladder - continue home myrbetriq 25mg  every day and Flomax 0.4mg  every day  Constipation - continue home Senna 8.6-50 mg x2 at bedtime, Bisacodyl 10mg  suppository every day prn and every Tuesday at bedtime, GERD - continue home Famotidine 20mg  prn  Best Practice: Diet: NPO VTE: Enoxaparin IVF: none Code: DNR  Signature: Katheran James, D.O.  Internal Medicine Resident, PGY-1 [facility]  Pager: [phone] 6:24 AM, 12/26/2022   Please contact the on call pager after 5 pm and on weekends at [phone].

## 2022-12-26 NOTE — Evaluation (Signed)
Clinical/Bedside Swallow Evaluation Patient Details  Name: Manuel [name] MRN: [id number] Date of Birth: [birth year]  Today's Date: 12/26/2022 Time: SLP Start Time (ACUTE ONLY): 1050 SLP Stop Time (ACUTE ONLY): 1106 SLP Time Calculation (min) (ACUTE ONLY): 16 min  Past Medical History:  Past Medical History:  Diagnosis Date   Alzheimer's dementia (HCC)    Hypertension    Prostate disorder    Past Surgical History:  Past Surgical History:  Procedure Laterality Date   TOTAL HIP ARTHROPLASTY     HPI:  Pt is an 89 yo male presenting 8/30 from SNF with AMS and hypoxia, admitted for septic CAP with hypoxic respiratory failure. PMH includes: dementia    Assessment / Plan / Recommendation  Clinical Impression  Pt alert and remained alert, but participation still limited by dementia. Exhibits signs of dysphagia including defensive oral behaviors, poor awareness of cup/ straw use, very late swallow response after oral manipulation, immediate cough with thin liquids. Pt required slow careful feeding from a cup edge or spoon, he was often saying "no, stop" during feeding though he also repeatedly stated he was hungry and wanted bacon and eggs. Pt is at risk of aspiration without diet modification and careful feeding strategies. Will initiate honey thick liquids and puree with pills crushed in puree. May be able to advance if attention and awareness improve. Would not be capable of participating in MBS at this time.  Posted sign and discussed with RN. SLP Visit Diagnosis: Dysphagia, oral phase (R13.11)    Aspiration Risk  Moderate aspiration risk;Risk for inadequate nutrition/hydration    Diet Recommendation Dysphagia 1 (Puree);Honey-thick liquid    Liquid Administration via: Cup;Spoon Medication Administration: Crushed with puree Supervision: Full supervision/cueing for compensatory strategies;Staff to assist with self feeding Compensations: Slow rate;Small sips/bites;Minimize environmental  distractions Postural Changes: Seated upright at 90 degrees    Other  Recommendations Oral Care Recommendations: Oral care BID Caregiver Recommendations: Have oral suction available    Recommendations for follow up therapy are one component of a multi-disciplinary discharge planning process, led by the attending physician.  Recommendations may be updated based on patient status, additional functional criteria and insurance authorization.  Follow up Recommendations Skilled nursing-short term rehab (<3 hours/day)      Assistance Recommended at Discharge    Functional Status Assessment Patient has had a recent decline in their functional status and demonstrates the ability to make significant improvements in function in a reasonable and predictable amount of time.  Frequency and Duration min 2x/week          Prognosis Prognosis for improved oropharyngeal function: Fair Barriers to Reach Goals: Cognitive deficits      Swallow Study   General HPI: Pt is an 89 yo male presenting 8/30 from SNF with AMS and hypoxia, admitted for septic CAP with hypoxic respiratory failure. PMH includes: dementia Type of Study: Bedside Swallow Evaluation Previous Swallow Assessment: none Diet Prior to this Study: NPO Temperature Spikes Noted: No Respiratory Status: Room air History of Recent Intubation: No Behavior/Cognition: Alert;Distractible;Requires cueing Oral Cavity Assessment: Within Functional Limits Oral Care Completed by SLP: No Oral Cavity - Dentition: Edentulous Vision: Impaired for self-feeding Self-Feeding Abilities: Total assist Patient Positioning: Upright in bed Baseline Vocal Quality: Normal Volitional Cough: Cognitively unable to elicit Volitional Swallow: Unable to elicit    Oral/Motor/Sensory Function Overall Oral Motor/Sensory Function: Other (comment) (does not follow commands)   Ice Chips Ice chips: Not tested   Thin Liquid Thin Liquid: Impaired Presentation:  Straw;Cup Oral Phase Impairments:  Poor awareness of bolus Oral Phase Functional Implications: Prolonged oral transit Pharyngeal  Phase Impairments: Suspected delayed Swallow;Cough - Immediate    Nectar Thick Nectar Thick Liquid: Not tested   Honey Thick Honey Thick Liquid: Impaired Presentation: Spoon;Cup;Straw Oral Phase Impairments: Poor awareness of bolus Oral Phase Functional Implications: Prolonged oral transit Pharyngeal Phase Impairments: Suspected delayed Swallow;Multiple swallows   Puree Puree: Impaired Presentation: Spoon Pharyngeal Phase Impairments: Suspected delayed Swallow   Solid     Solid: Not tested      Claudine Mouton 12/26/2022,11:25 AM

## 2022-12-26 NOTE — Plan of Care (Signed)
Problem: Education: Goal: Knowledge of General Education information will improve Description: Including pain rating scale, medication(s)/side effects and non-pharmacologic comfort measures Outcome: Not Progressing   Problem: Health Behavior/Discharge Planning: Goal: Ability to manage health-related needs will improve Outcome: Not Progressing   Problem: Clinical Measurements: Goal: Ability to maintain clinical measurements within normal limits will improve Outcome: Not Progressing Goal: Will remain free from infection Outcome: Not Progressing Goal: Diagnostic test results will improve Outcome: Not Progressing Goal: Respiratory complications will improve Outcome: Not Progressing Goal: Cardiovascular complication will be avoided Outcome: Not Progressing   Problem: Activity: Goal: Risk for activity intolerance will decrease Outcome: Not Progressing   Problem: Nutrition: Goal: Adequate nutrition will be maintained Outcome: Not Progressing   Problem: Coping: Goal: Level of anxiety will decrease Outcome: Not Progressing   Problem: Elimination: Goal: Will not experience complications related to bowel motility Outcome: Not Progressing Goal: Will not experience complications related to urinary retention Outcome: Not Progressing   Problem: Pain Managment: Goal: General experience of comfort will improve Outcome: Not Progressing   Problem: Safety: Goal: Ability to remain free from injury will improve Outcome: Not Progressing   Problem: Skin Integrity: Goal: Risk for impaired skin integrity will decrease Outcome: Not Progressing   Problem: Education: Goal: Knowledge of General Education information will improve Description: Including pain rating scale, medication(s)/side effects and non-pharmacologic comfort measures Outcome: Not Progressing   Problem: Health Behavior/Discharge Planning: Goal: Ability to manage health-related needs will improve Outcome: Not  Progressing   Problem: Clinical Measurements: Goal: Ability to maintain clinical measurements within normal limits will improve Outcome: Not Progressing Goal: Will remain free from infection Outcome: Not Progressing Goal: Diagnostic test results will improve Outcome: Not Progressing Goal: Respiratory complications will improve Outcome: Not Progressing Goal: Cardiovascular complication will be avoided Outcome: Not Progressing   Problem: Activity: Goal: Risk for activity intolerance will decrease Outcome: Not Progressing   Problem: Nutrition: Goal: Adequate nutrition will be maintained Outcome: Not Progressing   Problem: Coping: Goal: Level of anxiety will decrease Outcome: Not Progressing   Problem: Elimination: Goal: Will not experience complications related to bowel motility Outcome: Not Progressing Goal: Will not experience complications related to urinary retention Outcome: Not Progressing   Problem: Pain Managment: Goal: General experience of comfort will improve Outcome: Not Progressing   Problem: Safety: Goal: Ability to remain free from injury will improve Outcome: Not Progressing   Problem: Skin Integrity: Goal: Risk for impaired skin integrity will decrease Outcome: Not Progressing   Problem: Education: Goal: Knowledge of General Education information will improve Description: Including pain rating scale, medication(s)/side effects and non-pharmacologic comfort measures Outcome: Not Progressing   Problem: Health Behavior/Discharge Planning: Goal: Ability to manage health-related needs will improve Outcome: Not Progressing   Problem: Clinical Measurements: Goal: Ability to maintain clinical measurements within normal limits will improve Outcome: Not Progressing Goal: Will remain free from infection Outcome: Not Progressing Goal: Diagnostic test results will improve Outcome: Not Progressing Goal: Respiratory complications will improve Outcome: Not  Progressing Goal: Cardiovascular complication will be avoided Outcome: Not Progressing   Problem: Activity: Goal: Risk for activity intolerance will decrease Outcome: Not Progressing   Problem: Nutrition: Goal: Adequate nutrition will be maintained Outcome: Not Progressing   Problem: Coping: Goal: Level of anxiety will decrease Outcome: Not Progressing   Problem: Elimination: Goal: Will not experience complications related to bowel motility Outcome: Not Progressing Goal: Will not experience complications related to urinary retention Outcome: Not Progressing   Problem: Pain Managment: Goal: General experience of comfort will  improve Outcome: Not Progressing   Problem: Safety: Goal: Ability to remain free from injury will improve Outcome: Not Progressing   Problem: Skin Integrity: Goal: Risk for impaired skin integrity will decrease Outcome: Not Progressing   Problem: Education: Goal: Knowledge of General Education information will improve Description: Including pain rating scale, medication(s)/side effects and non-pharmacologic comfort measures Outcome: Not Progressing   Problem: Health Behavior/Discharge Planning: Goal: Ability to manage health-related needs will improve Outcome: Not Progressing   Problem: Clinical Measurements: Goal: Ability to maintain clinical measurements within normal limits will improve Outcome: Not Progressing Goal: Will remain free from infection Outcome: Not Progressing Goal: Diagnostic test results will improve Outcome: Not Progressing Goal: Respiratory complications will improve Outcome: Not Progressing Goal: Cardiovascular complication will be avoided Outcome: Not Progressing   Problem: Activity: Goal: Risk for activity intolerance will decrease Outcome: Not Progressing   Problem: Nutrition: Goal: Adequate nutrition will be maintained Outcome: Not Progressing   Problem: Coping: Goal: Level of anxiety will decrease Outcome:  Not Progressing   Problem: Elimination: Goal: Will not experience complications related to bowel motility Outcome: Not Progressing Goal: Will not experience complications related to urinary retention Outcome: Not Progressing   Problem: Pain Managment: Goal: General experience of comfort will improve Outcome: Not Progressing   Problem: Safety: Goal: Ability to remain free from injury will improve Outcome: Not Progressing   Problem: Skin Integrity: Goal: Risk for impaired skin integrity will decrease Outcome: Not Progressing

## 2022-12-26 NOTE — Progress Notes (Addendum)
Noticed a change in the MEWS. Patient has a fever that increased from 100.9 to 101.3. At bed side, he has increased oral secretions. Physical exam: diaphoretic, tachypnea, tachycardic, regular rhythm, crackles present in R lower lobe, Left lung auscultation is unremarkable.   Vitals:   12/26/22 0200 12/26/22 0205  BP: (!) 159/97   Pulse: 98   Resp: (!) 35   Temp:  99 F (37.2 C)  SpO2: 95%     On 2LNC  Per RN, she received report that he was choking on sips of water and during oral care throughout the day. She also noted increase in oral secretions along with "foam" in his mouth. No change in mentation.   Plan:Patient has been receiving azithromycin and rocephin daily, Nasal MRSA is negative. Concerned for aspiration due to recent choking, increased oral secretions and new fever on current antibiotics. Will begin antibiotics with anaerobe coverage with flagyl now. His current antibiotics will be due at noon today; consider switching to Zosyn then.  Plan: - Aspiration precautions CBC will be collected soon STAT blood cultures followed by flagyl IV -CXR to assess interval change in prior right infra hilar region and left lower lobe pneumonia, and for any new cardiopulmonary disease.    Faith Rogue, DO

## 2022-12-26 NOTE — Progress Notes (Signed)
Attempted to do deep throat suctioning but pt . was biting the catheter. To try later again. Oral suctioning done  with small whitish secretions.

## 2022-12-26 NOTE — Progress Notes (Signed)
Called phlebotomy and spoke with Clydie Braun to draw STAT blood cultures on patient. Stated she would be over in a few minutes.

## 2022-12-27 ENCOUNTER — Telehealth: Payer: Self-pay | Admitting: Student

## 2022-12-27 DIAGNOSIS — R6520 Severe sepsis without septic shock: Secondary | ICD-10-CM | POA: Diagnosis not present

## 2022-12-27 DIAGNOSIS — J9601 Acute respiratory failure with hypoxia: Secondary | ICD-10-CM | POA: Diagnosis not present

## 2022-12-27 DIAGNOSIS — A419 Sepsis, unspecified organism: Secondary | ICD-10-CM | POA: Diagnosis not present

## 2022-12-27 DIAGNOSIS — J189 Pneumonia, unspecified organism: Secondary | ICD-10-CM | POA: Diagnosis not present

## 2022-12-27 LAB — CBC WITH DIFFERENTIAL/PLATELET
Abs Immature Granulocytes: 0.25 10*3/uL — ABNORMAL HIGH (ref 0.00–0.07)
Basophils Absolute: 0 10*3/uL (ref 0.0–0.1)
Basophils Relative: 0 %
Eosinophils Absolute: 0 10*3/uL (ref 0.0–0.5)
Eosinophils Relative: 0 %
HCT: 38 % — ABNORMAL LOW (ref 39.0–52.0)
Hemoglobin: 12.6 g/dL — ABNORMAL LOW (ref 13.0–17.0)
Immature Granulocytes: 2 %
Lymphocytes Relative: 11 %
Lymphs Abs: 1.4 10*3/uL (ref 0.7–4.0)
MCH: 30.1 pg (ref 26.0–34.0)
MCHC: 33.2 g/dL (ref 30.0–36.0)
MCV: 90.9 fL (ref 80.0–100.0)
Monocytes Absolute: 1.1 10*3/uL — ABNORMAL HIGH (ref 0.1–1.0)
Monocytes Relative: 9 %
Neutro Abs: 9.5 10*3/uL — ABNORMAL HIGH (ref 1.7–7.7)
Neutrophils Relative %: 78 %
Platelets: 175 10*3/uL (ref 150–400)
RBC: 4.18 MIL/uL — ABNORMAL LOW (ref 4.22–5.81)
RDW: 14.9 % (ref 11.5–15.5)
WBC: 12.2 10*3/uL — ABNORMAL HIGH (ref 4.0–10.5)
nRBC: 0 % (ref 0.0–0.2)

## 2022-12-27 LAB — BASIC METABOLIC PANEL
Anion gap: 11 (ref 5–15)
BUN: 23 mg/dL (ref 8–23)
CO2: 21 mmol/L — ABNORMAL LOW (ref 22–32)
Calcium: 8.8 mg/dL — ABNORMAL LOW (ref 8.9–10.3)
Chloride: 108 mmol/L (ref 98–111)
Creatinine, Ser: 1.02 mg/dL (ref 0.61–1.24)
GFR, Estimated: >60 mL/min (ref >60)
Glucose, Bld: 101 mg/dL — ABNORMAL HIGH (ref 70–99)
Potassium: 3.9 mmol/L (ref 3.5–5.1)
Sodium: 140 mmol/L (ref 135–145)

## 2022-12-27 LAB — GLUCOSE, CAPILLARY: Glucose-Capillary: 92 mg/dL (ref 70–99)

## 2022-12-27 LAB — PROCALCITONIN: Procalcitonin: 3.01 ng/mL

## 2022-12-27 MED ORDER — MORPHINE SULFATE (PF) 2 MG/ML IV SOLN
1.0000 mg | INTRAVENOUS | Status: DC | PRN
  Administered 2022-12-27: 1 mg via INTRAVENOUS
  Filled 2022-12-27: qty 1

## 2022-12-27 MED ORDER — HALOPERIDOL LACTATE 5 MG/ML IJ SOLN: 0.5000 mg | INTRAMUSCULAR | Status: DC | PRN

## 2022-12-27 MED ORDER — LORAZEPAM 2 MG/ML IJ SOLN
1.0000 mg | INTRAMUSCULAR | Status: DC | PRN
  Administered 2022-12-27: 1 mg via INTRAVENOUS
  Filled 2022-12-27: qty 1

## 2022-12-27 MED ORDER — SODIUM CHLORIDE 0.9 % IV SOLN
2.0000 g | INTRAVENOUS | Status: AC
  Administered 2022-12-27: 2 g via INTRAVENOUS
  Filled 2022-12-27: qty 20

## 2022-12-27 MED ORDER — LORAZEPAM 1 MG PO TABS: 1.0000 mg | ORAL_TABLET | ORAL | Status: DC | PRN

## 2022-12-27 MED ORDER — HALOPERIDOL LACTATE 2 MG/ML PO CONC: 0.5000 mg | ORAL | Status: DC | PRN

## 2022-12-27 MED ORDER — MORPHINE SULFATE (CONCENTRATE) 10 MG/0.5ML PO SOLN
5.0000 mg | ORAL | Status: DC | PRN
  Administered 2022-12-27: 5 mg via ORAL
  Filled 2022-12-27: qty 0.5

## 2022-12-27 MED ORDER — GLYCOPYRROLATE 1 MG PO TABS: 1.0000 mg | ORAL_TABLET | ORAL | Status: DC | PRN

## 2022-12-27 MED ORDER — LORAZEPAM 2 MG/ML PO CONC: 1.0000 mg | ORAL | Status: DC | PRN

## 2022-12-27 MED ORDER — GLYCOPYRROLATE 0.2 MG/ML IJ SOLN
0.2000 mg | INTRAMUSCULAR | Status: DC | PRN
  Administered 2022-12-27 – 2022-12-28 (×2): 0.2 mg via INTRAVENOUS
  Filled 2022-12-27 (×4): qty 1

## 2022-12-27 MED ORDER — MORPHINE SULFATE (PF) 2 MG/ML IV SOLN: 1.0000 mg | INTRAVENOUS | Status: DC | PRN

## 2022-12-27 MED ORDER — IPRATROPIUM-ALBUTEROL 0.5-2.5 (3) MG/3ML IN SOLN
3.0000 mL | RESPIRATORY_TRACT | Status: DC | PRN
  Administered 2022-12-27: 3 mL via RESPIRATORY_TRACT
  Filled 2022-12-27: qty 3

## 2022-12-27 MED ORDER — HALOPERIDOL 0.5 MG PO TABS: 0.5000 mg | ORAL_TABLET | ORAL | Status: DC | PRN

## 2022-12-27 MED ORDER — MORPHINE SULFATE (CONCENTRATE) 10 MG/0.5ML PO SOLN
5.0000 mg | ORAL | Status: DC | PRN
  Administered 2022-12-27: 5 mg via SUBLINGUAL
  Filled 2022-12-27: qty 0.5

## 2022-12-27 MED ORDER — ACETAMINOPHEN 10 MG/ML IV SOLN
1000.0000 mg | Freq: Once | INTRAVENOUS | Status: AC
  Administered 2022-12-27: 1000 mg via INTRAVENOUS
  Filled 2022-12-27: qty 100

## 2022-12-27 NOTE — Progress Notes (Signed)
Spoke with both the patient's sister (mPOA) and niece on the phone after the patient was placed NPO again for aspiration concerns. Discussed goals of care including current MOST form signed in 04/2020 which indicates the patient would like to be made comfortable in the event off illness. He has failed conservative treatment for likely combined community acquired pneumonia and recurrent aspiration pneumonitis vs pneumonia. In line with the patient's wishes, confirmed and supported by his medical power of attorney as well as other close family we will transition to full comfort care.  - comfort care orders in with non comfort orders discontinued - morphine for pain and dyspnea, ativan for anxiety, and haldol for agitation as needed - palliative care consult - expect in hospital death but will work with the family for hospice, patient comes from [facility]  Rocky Morel, DO Internal Medicine Resident, PGY-2 Pager# [phone] Please contact the on call pager after 5 pm and on weekends at [phone].

## 2022-12-27 NOTE — Progress Notes (Signed)
Spoon fed during breakfast tolerated few teaspoon of grit and thickened orange juice.. Meds crushed mixed with  thickened OJ tolerated well. Noted to be coughing after swallowing on and off and noted to be congested . MD made aware during rounds with order for breathing treatment -given. Continue to monitor.

## 2022-12-27 NOTE — Telephone Encounter (Signed)
error 

## 2022-12-27 NOTE — Progress Notes (Signed)
MEWS Progress Note  Patient Details Name: Manuel [name] MRN: [id number] DOB: 10/22/1932 Today's Date: 12/27/2022   MEWS Flowsheet Documentation:  Assess: MEWS Score Temp: 99.7 F (37.6 C) BP: 137/78 MAP (mmHg): 94 Pulse Rate: (!) 106 ECG Heart Rate: (!) 109 Resp: (!) 36 Level of Consciousness: Alert SpO2: 93 % O2 Device: Nasal Cannula O2 Flow Rate (L/min): 33 L/min Assess: MEWS Score MEWS Temp: 0 MEWS Systolic: 0 MEWS Pulse: 1 MEWS RR: 3 MEWS LOC: 0 MEWS Score: 4 MEWS Score Color: Red Assess: SIRS CRITERIA SIRS Temperature : 0 SIRS Respirations : 1 SIRS Pulse: 1 SIRS WBC: 1 SIRS Score Sum : 3 SIRS Temperature : 0 SIRS Pulse: 1 SIRS Respirations : 1 SIRS WBC: 0 SIRS Score Sum : 2 Assess: if the MEWS score is Yellow or Red Were vital signs accurate and taken at a resting state?: Yes Does the patient meet 2 or more of the SIRS criteria?: Yes Does the patient have a confirmed or suspected source of infection?: Yes MEWS guidelines implemented : No, previously red, continue vital signs every 4 hours        Garnell Begeman I 12/27/2022, 11:00 AM

## 2022-12-27 NOTE — Progress Notes (Signed)
Speech Language Pathology Treatment: Dysphagia  Patient Details Name: Manuel [name] MRN: [id number] DOB: [birth year] Today's Date: 12/27/2022 Time: 8119-1478 SLP Time Calculation (min) (ACUTE ONLY): 8 min  Assessment / Plan / Recommendation Clinical Impression  Pt not tolerating diet, mentation has not improved, vital signs concerning. Pt is lethargic with pooled oral secretions. Continues to be orally defensive. Discussed with MD that pt is not stable to further instrumental assessment and subjective bedside assessment inconclusive. Pt is at high risk of aspiration and MD agreed for NPO at this time until plan of care can be further addressed with family.   HPI HPI: Pt is an 89 yo male presenting 8/30 from SNF with AMS and hypoxia, admitted for septic CAP with hypoxic respiratory failure. PMH includes: dementia      SLP Plan  Continue with current plan of care      Recommendations for follow up therapy are one component of a multi-disciplinary discharge planning process, led by the attending physician.  Recommendations may be updated based on patient status, additional functional criteria and insurance authorization.    Recommendations  Diet recommendations: NPO Medication Administration: Crushed with puree                              Continue with current plan of care     Kallee Nam, Riley Nearing  12/27/2022, 1:53 PM

## 2022-12-27 NOTE — Progress Notes (Signed)
HD#3 SUBJECTIVE:  Patient Summary: Manuel [name] is a 89 y.o. with a pertinent PMH of dementia, who presented with AMS and hypoxia and admitted for Septic CAP with hypoxic respiratory failure.   Overnight Events: Pt evaluated by night team per MEWS change to 6 - he was found to be sleeping, able to be aroused, alert to name/place, without complaints, declining food and oral meds, with improved level of interaction  Interim History: He is more alert today, sitting up in bed, eating thicks with assistance of his nurse, but he still appears altered with somnolence and confusion. Family not at bedside so unclear if he is closer to baseline. Responding always to my prompts but still stuttering over words. Eyes closed during interview. Usually responds to statements with "thank you," as before.   OBJECTIVE:  Vital Signs: Vitals:   12/26/22 2000 12/26/22 2100 12/26/22 2300 12/27/22 0339  BP: (!) 163/99 (!) 158/98 (!) 159/86 137/78  Pulse: (!) 107 (!) 112 81 (!) 106  Resp: (!) 40 (!) 41 (!) 41 (!) 36  Temp:   98 F (36.7 C) 99.7 F (37.6 C)  TempSrc:   Axillary Axillary  SpO2: 93% 93% 97% 93%  Weight:      Height:       Supplemental O2: Nasal Cannula SpO2: 93 % O2 Flow Rate (L/min): 3 L/min  Filed Weights   12/16/2022 2030 12/24/22 0500  Weight: 77.2 kg 77.2 kg     Intake/Output Summary (Last 24 hours) at 12/27/2022 0656 Last data filed at 12/26/2022 1900 Gross per 24 hour  Intake 975 ml  Output 1500 ml  Net -525 ml   Net IO Since Admission: -1,889.23 mL [12/27/22 0656]  Physical Exam: Physical Exam Constitutional:      General: He is not in acute distress.    Appearance: He is ill-appearing.  HENT:     Head: Normocephalic and atraumatic.  Cardiovascular:     Rate and Rhythm: Normal rate and regular rhythm.     Pulses: Normal pulses.  Pulmonary:     Effort: Pulmonary effort is normal. No respiratory distress.     Breath sounds: Normal breath sounds.  Abdominal:      General: Bowel sounds are normal.     Palpations: Abdomen is soft.  Musculoskeletal:     Right lower leg: No edema.     Left lower leg: No edema.  Skin:    General: Skin is warm and dry.  Neurological:     General: No focal deficit present.     Mental Status: He is disoriented.     Patient Lines/Drains/Airways Status     Active Line/Drains/Airways     Name Placement date Placement time Site Days   Peripheral IV 11/25/2022 20 G Right Antecubital 12/08/2022  1105  Antecubital  4   Peripheral IV 12/18/2022 20 G Posterior;Right Forearm 11/26/2022  --  Forearm  4   External Urinary Catheter 11/28/2022  2030  --  4             ASSESSMENT/PLAN:  Assessment: Principal Problem:   Pneumonia Active Problems:   Sepsis with acute hypoxic respiratory failure without septic shock (HCC)  Manuel [name] is a 89 y.o. male with pertinent PMH of dementia who presented with AMS and hypoxia and is admitted for Septic CAP with hypoxic respiratory failure and altered mental status.   Plan: CAP with Sepsis and Hypoxic Respiratory Failure Altered Mental Status - somnolence and confusion Visited by night team last  night per MEWS, tachycardic, tachypnic. Was calmed by them and found to be more interactive than befeore. Stable respiratory status today. Still Tachypneic. Now on 2L oxygen with sats in high 90s. Stuttering his words. Will continue to treat for community-acquired pneumonia + anerobic coverage. Will continue to monitor mental status. Progressing white count - now leukocytosis 12.2. - Continue Rocephin 2g every day x 5 days and done with Zithromax 500mg . Today is day 5. - Continue flagyl 500mg  q12 - New blood cultures per overnight fever on 9/2 without growth, as are admission cultures - Wean to room air O2 sat goal above 92% - Delirium precautions - SLP eval yesterday progressed diet to thick liquids - Breathing treatment per congestion - Will return to bedside in afternoon to evaluate mental  status   Demenia - continue home Memantine HCL 10mg  BID - hold home Seroquel 75mg  at bedtime - continue home Depakote 125 mg TID    Chronic pain - of the lower back and knees - continue home Tylenol 1000 q8h prn - continue home Voltaren gel prn to knees and back - continue home Tramadol 50mg  BID prn   HTN - continue home amlodipine 5mg  every day and losartan 100mg  every day Overactive bladder - continue home myrbetriq 25mg  every day and Flomax 0.4mg  every day  Constipation - continue home Senna 8.6-50 mg x2 at bedtime, Bisacodyl 10mg  suppository every day prn and every Tuesday at bedtime, GERD - continue home Famotidine 20mg  prn  Best Practice: Diet: NPO VTE: Enoxaparin IVF: none Code: DNR/DNI  Signature: Manuel [name], D.O.  Internal Medicine Resident, PGY-1 [facility]  Pager: [phone] 6:56 AM, 12/27/2022   Please contact the on call pager after 5 pm and on weekends at [phone].

## 2022-12-28 DIAGNOSIS — Z7189 Other specified counseling: Secondary | ICD-10-CM

## 2022-12-28 DIAGNOSIS — R6520 Severe sepsis without septic shock: Secondary | ICD-10-CM | POA: Diagnosis not present

## 2022-12-28 DIAGNOSIS — J189 Pneumonia, unspecified organism: Secondary | ICD-10-CM | POA: Diagnosis not present

## 2022-12-28 DIAGNOSIS — A419 Sepsis, unspecified organism: Secondary | ICD-10-CM | POA: Diagnosis not present

## 2022-12-28 DIAGNOSIS — Z515 Encounter for palliative care: Secondary | ICD-10-CM

## 2022-12-28 DIAGNOSIS — J9601 Acute respiratory failure with hypoxia: Secondary | ICD-10-CM | POA: Diagnosis not present

## 2022-12-28 LAB — CULTURE, BLOOD (ROUTINE X 2)
Culture: NO GROWTH
Culture: NO GROWTH

## 2022-12-28 MED ORDER — MORPHINE BOLUS VIA INFUSION
1.0000 mg | INTRAVENOUS | Status: DC | PRN
  Administered 2022-12-28 – 2022-12-29 (×4): 1 mg via INTRAVENOUS

## 2022-12-28 MED ORDER — MORPHINE SULFATE (PF) 2 MG/ML IV SOLN
1.0000 mg | INTRAVENOUS | Status: DC | PRN
  Administered 2022-12-28: 1 mg via INTRAVENOUS
  Filled 2022-12-28: qty 1

## 2022-12-28 MED ORDER — KETOROLAC TROMETHAMINE 15 MG/ML IJ SOLN
15.0000 mg | Freq: Three times a day (TID) | INTRAMUSCULAR | Status: DC
  Administered 2022-12-28 (×3): 15 mg via INTRAVENOUS
  Filled 2022-12-28 (×3): qty 1

## 2022-12-28 MED ORDER — LORAZEPAM 2 MG/ML IJ SOLN: 0.5000 mg | INTRAMUSCULAR | Status: DC | PRN

## 2022-12-28 MED ORDER — GLYCOPYRROLATE 0.2 MG/ML IJ SOLN
0.4000 mg | INTRAMUSCULAR | Status: DC
  Administered 2022-12-28 (×5): 0.4 mg via INTRAVENOUS
  Filled 2022-12-28 (×7): qty 2

## 2022-12-28 MED ORDER — MORPHINE 100MG IN NS 100ML (1MG/ML) PREMIX INFUSION
1.0000 mg/h | INTRAVENOUS | Status: DC
  Administered 2022-12-28 (×2): 1 mg/h via INTRAVENOUS
  Filled 2022-12-28: qty 100

## 2022-12-28 NOTE — Plan of Care (Signed)
  Problem: Coping: Goal: Level of anxiety will decrease Outcome: Progressing   Problem: Pain Managment: Goal: General experience of comfort will improve Outcome: Progressing   Problem: Safety: Goal: Ability to remain free from injury will improve Outcome: Progressing   

## 2022-12-28 NOTE — Consult Note (Cosign Needed Addendum)
Palliative Medicine Inpatient Consult Note  Consulting Provider: Rocky Morel, DO   Reason for consult:   Palliative Care Consult Services Palliative Medicine Consult   Symptom Management Consult  Reason for Consult? comfort care   12/28/2022  HPI:  Per intake H&P --> Manuel [name] is a 89 y.o. with a pertinent PMH of dementia, who presented with AMS and hypoxia and admitted for Septic CAP with hypoxic respiratory failure.  Patients family elected to make him full comfort care overnight in accordance with his wishes. The PMT has been asked to continue end of life support and further discuss hospice care on transition.  Clinical Assessment/Goals of Care:  *Please note that this is a verbal dictation therefore any spelling or grammatical errors are due to the "[employer] One" system interpretation.  I have reviewed medical records including EPIC notes, labs and imaging, received report from bedside RN, assessed the patient who is breathing rapidly.    I called patients sister, Manuel [name] to further discuss diagnosis prognosis, GOC, EOL wishes, disposition and options.   I introduced Palliative Medicine as specialized medical care for people living with serious illness. It focuses on providing relief from the symptoms and stress of a serious illness. The goal is to improve quality of life for both the patient and the family.  Medical History Review and Understanding:  We reviewed Mr. Manuel [name] past medical history significant for dementia Alzheimer's type, hypertension, prostatic hypertrophy, and generalized weakness.  Advance Directives:  A detailed discussion was had today regarding advanced directives.  Patient's sister Manuel [name] is identified as his Education officer, environmental.  Code Status:  Concepts specific to code status, artifical feeding and hydration, continued IV antibiotics and rehospitalization was had.  The difference between a aggressive medical intervention path  and a palliative  comfort care path for this patient at this time was had.   Manuel [name] is an established DO NOT RESUSCITATE DO NOT INTUBATE CODE STATUS  Discussion:  Discussed Manuel [name] hospitalization in the setting of hypoxemic respiratory failure from community-acquired pneumonia.  Reviewed with his sister the severity of his dementia which has gotten to a point where he has very limited ability to perform any type of self-care.  Discussed his overall trajectory in the setting of this recent aspirational event.  Reviewed and confirmed the goals for comfort.  We talked about transition to comfort measures in house and what that would entail inclusive of medications to control pain, dyspnea, agitation, nausea, itching, and hiccups.    We discussed stopping all uneccessary measures such as cardiac monitoring, blood draws, needle sticks, and frequent vital signs.   Utilized reflective listening throughout our time together.   I shared given Manuel [name] present respiratory state I would strongly recommend initiating a low-dose morphine drip.  Patient's sister vocalizes feeling very at peace with pursuing this and that she knows in her heart she has done everything possible to give him the best chance at life.  She vocalizes feeling confident in the decision she is made in regards to his care and is hopeful he has a possible piecing from this life to the next. _______________________ Addendum:  I met with Manuel [name] sister, brother, SIL, and niece this late afternoon. We discussed the type of man that Manuel [name] was and how he has impacted their lives.  I provided clarity on what to expect in the EOL process and education on symptom management.   Additional Time: 25  Decision Maker: Manuel [name],Manuel [name] (Sister): [phone] (Mobile)   SUMMARY OF  RECOMMENDATIONS   DNAR/DNI  Comfort focused care  Have initiated a morphine drip  Unrestricted visitation  Anticipate patient will have likely hours - days to  live --> will be an inpatient hospital death  Ongoing palliative care support  Code Status/Advance Care Planning: DNAR/DNI  Palliative Prophylaxis:  Aspiration, Bowel Regimen, Delirium Protocol, Frequent Pain Assessment, Oral Care, Palliative Wound Care, and Turn Reposition  Additional Recommendations (Limitations, Scope, Preferences): Continue current care  Psycho-social/Spiritual:  Desire for further Chaplaincy support: Yes Additional Recommendations: Education on end-of-life measures   Prognosis: Limited hours to days at the most  Discharge Planning: Discharge will be [facility]  Vitals:   12/27/22 1603 12/27/22 1944  BP: 115/66 (!) 153/81  Pulse: (!) 119 (!) 126  Resp: (!) 54 (!) 23  Temp: 98.5 F (36.9 C) (!) 102.3 F (39.1 C)  SpO2: 92% 92%    Intake/Output Summary (Last 24 hours) at 12/28/2022 6962 Last data filed at 12/27/2022 9528 Gross per 24 hour  Intake --  Output 700 ml  Net -700 ml   Last Weight  Most recent update: 12/24/2022  6:52 AM    Weight  77.2 kg (170 lb 3.1 oz)             Gen: Elderly African-American male in severe distress HEENT: Dry mucous membranes CV: Irregular rate and rhythm PULM: On 2 L nasal cannula breathing is labored ABD: soft/nontender EXT: No edema Neuro: Somnolent  PPS: 10%   This conversation/these recommendations were discussed with patient primary care team, Dr. Mikey Bussing  Billing based on MDM: High  Problems Addressed: One acute or chronic illness or injury that poses a threat to life or bodily function  Amount and/or Complexity of Data: Category 3:Discussion of management or test interpretation with external physician/other qualified health care professional/appropriate source (not separately reported)  Risks: Decision regarding hospitalization or escalation of hospital care and Decision not to resuscitate or to de-escalate care because of poor  prognosis ______________________________________________________ Lamarr Lulas [place] Palliative Medicine Team Team Cell Phone: [phone] Please utilize secure chat with additional questions, if there is no response within 30 minutes please call the above phone number  Palliative Medicine Team providers are available by phone from 7am to 7pm daily and can be reached through the team cell phone.  Should this patient require assistance outside of these hours, please call the patient's attending physician.

## 2022-12-28 NOTE — Care Management Important Message (Signed)
Important Message  Patient Details  Name: Manuel [name] MRN: [id number] Date of Birth: 09/01/1932   Medicare Important Message Given:  Yes     Dorena Bodo 12/28/2022, 4:11 PM

## 2022-12-28 NOTE — Plan of Care (Signed)
  Problem: Education: Goal: Knowledge of General Education information will improve Description: Including pain rating scale, medication(s)/side effects and non-pharmacologic comfort measures Outcome: Not Progressing   Problem: Health Behavior/Discharge Planning: Goal: Ability to manage health-related needs will improve Outcome: Not Progressing   Problem: Clinical Measurements: Goal: Ability to maintain clinical measurements within normal limits will improve Outcome: Not Progressing Goal: Will remain free from infection Outcome: Not Progressing Goal: Diagnostic test results will improve Outcome: Not Progressing Goal: Respiratory complications will improve Outcome: Not Progressing Goal: Cardiovascular complication will be avoided Outcome: Not Progressing   Problem: Activity: Goal: Risk for activity intolerance will decrease Outcome: Not Progressing   Problem: Nutrition: Goal: Adequate nutrition will be maintained Outcome: Not Progressing   Problem: Coping: Goal: Level of anxiety will decrease Outcome: Not Progressing   Problem: Elimination: Goal: Will not experience complications related to bowel motility Outcome: Not Progressing Goal: Will not experience complications related to urinary retention Outcome: Not Progressing   Problem: Pain Managment: Goal: General experience of comfort will improve Outcome: Not Progressing   Problem: Safety: Goal: Ability to remain free from injury will improve Outcome: Not Progressing   Problem: Skin Integrity: Goal: Risk for impaired skin integrity will decrease Outcome: Not Progressing   Problem: Education: Goal: Knowledge of the prescribed therapeutic regimen will improve Outcome: Not Progressing   Problem: Coping: Goal: Ability to identify and develop effective coping behavior will improve Outcome: Not Progressing   Problem: Clinical Measurements: Goal: Quality of life will improve Outcome: Not Progressing   Problem:  Respiratory: Goal: Verbalizations of increased ease of respirations will increase Outcome: Not Progressing   Problem: Role Relationship: Goal: Family's ability to cope with current situation will improve Outcome: Not Progressing Goal: Ability to verbalize concerns, feelings, and thoughts to partner or family member will improve Outcome: Not Progressing   Problem: Pain Management: Goal: Satisfaction with pain management regimen will improve Outcome: Not Progressing

## 2022-12-28 NOTE — Progress Notes (Addendum)
HD#4 SUBJECTIVE:  Patient Summary: Manuel [name] is a 89 y.o. with a pertinent PMH of dementia, who presented with AMS and hypoxia and admitted for Septic CAP with hypoxic respiratory failure.   Overnight Events: No events overnight  Interim History: Pt transitioned to comfort care yesterday afternoon upon discussion with his family including mPOA, his sister. This is in alignment with his pre-stated wishes and hospital course with ongoing encephalopathy. Visited the patient this morning, he was somnolent and unresponsive in his bed. He did not appear uncomfortable.  OBJECTIVE:  Vital Signs: Vitals:   12/27/22 0700 12/27/22 1146 12/27/22 1603 12/27/22 1944  BP:  126/80 115/66 (!) 153/81  Pulse:  (!) 119 (!) 119 (!) 126  Resp:  (!) 43 (!) 54 (!) 23  Temp:  98.5 F (36.9 C) 98.5 F (36.9 C) (!) 102.3 F (39.1 C)  TempSrc: Oral Oral Oral Axillary  SpO2:  94% 92% 92%  Weight:      Height:       Supplemental O2: Room Air SpO2: 92 % O2 Flow Rate (L/min): 33 L/min  Filed Weights   11/25/2022 2030 12/24/22 0500  Weight: 77.2 kg 77.2 kg     Intake/Output Summary (Last 24 hours) at 12/28/2022 1043 Last data filed at 12/27/2022 1958 Gross per 24 hour  Intake --  Output 600 ml  Net -600 ml   Net IO Since Admission: -3,189.23 mL [12/28/22 1043]  Physical Exam: Physical Exam Constitutional:      General: He is not in acute distress.    Appearance: He is ill-appearing.  HENT:     Mouth/Throat:     Comments: Dry Cardiovascular:     Rate and Rhythm: Normal rate and regular rhythm.  Pulmonary:     Effort: Tachypnea present.  Abdominal:     Palpations: Abdomen is soft.     Tenderness: There is no abdominal tenderness.  Skin:    General: Skin is warm and dry.  Neurological:     Comments: Somnolent  Psychiatric:        Mood and Affect: Mood is not anxious.        Behavior: Behavior is not agitated.     Patient Lines/Drains/Airways Status     Active  Line/Drains/Airways     Name Placement date Placement time Site Days   Peripheral IV 11/28/2022 20 G Right Antecubital 11/27/2022  1105  Antecubital  5   Peripheral IV 12/09/2022 20 G Posterior;Right Forearm 12/02/2022  --  Forearm  5   External Urinary Catheter 12/14/2022  2030  --  5             ASSESSMENT/PLAN:  Assessment: Principal Problem:   Pneumonia Active Problems:   Sepsis with acute hypoxic respiratory failure without septic shock (HCC)  Manuel [name] is a 89 y.o. male with pertinent PMH of dementia who presented with AMS and hypoxia and is admitted for Septic CAP with hypoxic respiratory failure and altered mental status.   Pt transitioned to comfort care on 9/3  Plan: Pt to Comfort Care Transitioned on 9/3 per discussion with the patient's family including his sister Manuel [name] who is his mPOA. Despite treatment of his pneumonia his mental status has not improved to near his baseline. He remained unable to communicate effectively and unable to tolerate oral intake without aspiration and without utility in ADLs. Persistent tachycardia, tachypnea, oxygen requirement. It was felt that further treatment would require escalation of care without a clear endpoint which is not in  accordance with his previously stated wishes. Accordingly, his medical team and family agreed that comfort measures are appropriate for Manuel [name]. End of life support initiated and palliative involved. An in-hospital death is anticipated. - Glycopyrrolate 0.4mg  IV q4h - Ativan 0.5-1mg  IV q1h prn -Toradol 15mg  IV q8h - Morphine drip  CAP with Sepsis and Hypoxic Respiratory Failure Altered Mental Status - somnolence and confusion Manuel [name] completed full treatment for CAP with additional anerobic coverage. His mental status failed to improve significantly. He experienced intermittent fevers, progressive white count, and persistent lung opacities. He remained with an oxygen requirement with tachycardia and  tachypnea. Attempts to advance his diet were met with concern for ongoing aspiration. Per his previously stated wishes, further escalation of care was not an option. This was discussed with his sister, his mPOA, who intended to honor the patient's wishes per above. - Completed 5 days Rocephin 2g every day and 3 days Zithromax 500mg  and 3 days flagyl 500mg  q12 - Blood cultures remain without growth (8/30 and 9/2 collections)  Demenia - stopped home Memantine HCL 10mg  BID - held home Seroquel 75mg  at bedtime - stopped home Depakote 125 mg TID  Chronic pain - of the lower back and knees - stopped home Tylenol 1000 q8h prn - stopped home Voltaren gel prn to knees and back - stopped home Tramadol 50mg  BID prn HTN - stopped home amlodipine 5mg  every day and losartan 100mg  every day Overactive bladder - stopped home myrbetriq 25mg  every day and Flomax 0.4mg  every day  Constipation - stopped home Senna 8.6-50 mg x2 at bedtime, Bisacodyl 10mg  suppository every day prn and every Tuesday at bedtime, GERD - stopped home Famotidine 20mg  prn  Best Practice: Diet: Regular diet IVF:  None Code: DNR/DNI Comfort AB: stopped Family Contact: Manuel [name] (Sister, [place]), To be notified at time of death. [phone]  Signature: Katheran James, D.O.  Internal Medicine Resident, PGY-1 [facility]  Pager: [phone] 10:43 AM, 12/28/2022   Please contact the on call pager after 5 pm and on weekends at [phone].

## 2022-12-29 MED ORDER — SCOPOLAMINE 1 MG/3DAYS TD PT72
1.0000 | MEDICATED_PATCH | TRANSDERMAL | Status: DC
  Filled 2022-12-29: qty 1

## 2022-12-31 LAB — CULTURE, BLOOD (ROUTINE X 2)
Culture: NO GROWTH
Culture: NO GROWTH
Special Requests: ADEQUATE
Special Requests: ADEQUATE

## 2023-01-24 NOTE — Death Summary Note (Addendum)
  Name: Manuel [name] MRN: [id number] DOB: Sep 19, 1932 89 y.o.  Date of Admission: 11/26/2022 10:15 AM Date of Discharge: January 15, 2023 Attending Physician: Dr. Carlynn Purl, DO  Discharge Diagnosis: Principal Problem:   Pneumonia Active Problems:   Essential hypertension, benign   Pure hypercholesterolemia   Moderate dementia with behavioral disturbance (HCC)   Sepsis with acute hypoxic respiratory failure without septic shock (HCC)   Cause of death: Pneumonia with Sepsis and Hypoxic Respiratory Failure Time of death: 0148 on January 15, 2023  Disposition and follow-up:   [name] was discharged from [facility] in expired condition.    Hospital Course: Mr Manuel [name] was admitted to [facility] on 12/13/2022 for CAP causing sepsis and altered mental status. He completed full treatment for CAP with additional treatment for anaerobic infection due to concern of aspiration. His mental status failed to improve significantly and he was always unable to communicate effectively or complete any ADLs. Additionally, there was concern for ongoing aspiration with attempts to advance his diet. He demonstrated intermittent fevers, progressive white count, and persistent lung opacities. He remained with an oxygen requirement and persistent tachycardia and tachypnea. Per his previously stated wishes, further escalation of care including feeding tube, ICU care, intubation were not an option. This was discussed with his sister, his mPOA, who intended to honor the patient's wishes previously stated wishes. On 9/3 Mr. Manuel [name] was transitioned to comfort care. This was done after completing treatment for CAP and per discussion with the patient's family including his sister Manuel [name] who is his mPOA. His medical team and family agreed that comfort measures are appropriate for Mr. Manuel [name]. End of life support was initiated and palliative care was involved. An in-hospital death was anticipated and  occurred peacefully at approximately 0148 on the morning of 15-Jan-2023.   SignedKatheran James, DO 2023/01/15, 1:53 PM

## 2023-01-24 NOTE — Discharge Summary (Deleted)
  Name: Manuel [name] MRN: [id number] DOB: Sep 09, 1932 89 y.o.  Date of Admission: 12/24/2022 10:15 AM Date of Discharge: 01-04-23 Attending Physician: Dr. Carlynn Purl, DO  Discharge Diagnosis: Principal Problem:   Pneumonia Active Problems:   Sepsis with acute hypoxic respiratory failure without septic shock [facility])  Cause of death: Pneumonia Time of death: 0148 on 01-04-23  Disposition and follow-up:   Manuel [name] was discharged from [facility] in expired condition.    Hospital Course: Mr Manuel [name] was admitted to [facility] on 11/25/2022 for CAP causing sepsis and altered mental status. He completed full treatment for CAP with additional treatment for anaerobic infection due to concern of aspiration. His mental status failed to improve significantly and he was always unable to communicate effectively or complete any ADLs. Additionally, there was concern for ongoing aspiration with attempts to advance his diet. He demonstrated intermittent fevers, progressive white count, and persistent lung opacities. He remained with an oxygen requirement and persistent tachycardia and tachypnea. Per his previously stated wishes, further escalation of care including feeding tube, ICU care, intubation were not an option. This was discussed with his sister, his mPOA, who intended to honor the patient's wishes previously stated wishes. On 9/3 Manuel [name] was transitioned to comfort care. This was done after completing treatment for CAP and per discussion with the patient's family including his sister Manuel [name] who is his mPOA. His medical team and family agreed that comfort measures are appropriate for Manuel [name]. End of life support was initiated and palliative care was involved. An in-hospital death was anticipated and occurred peacefully at approximately 0148 on the morning of 01-04-23.   SignedKatheran James, DO 01-04-2023, 1:53 PM

## 2023-01-24 DEATH — deceased
